# Patient Record
Sex: Male | Born: 1935 | Race: White | Hispanic: No | Marital: Married | Smoking: Never smoker
Health system: Southern US, Community
[De-identification: ages and names within clinical notes are randomized; demographics above are authoritative.]

## PROBLEM LIST (undated history)

## (undated) DIAGNOSIS — Z7901 Long term (current) use of anticoagulants: Secondary | ICD-10-CM

## (undated) DIAGNOSIS — L02519 Cutaneous abscess of unspecified hand: Secondary | ICD-10-CM

## (undated) DIAGNOSIS — G2581 Restless legs syndrome: Secondary | ICD-10-CM

## (undated) DIAGNOSIS — I2510 Atherosclerotic heart disease of native coronary artery without angina pectoris: Secondary | ICD-10-CM

## (undated) DIAGNOSIS — N400 Enlarged prostate without lower urinary tract symptoms: Secondary | ICD-10-CM

## (undated) DIAGNOSIS — R5381 Other malaise: Secondary | ICD-10-CM

## (undated) DIAGNOSIS — C801 Malignant (primary) neoplasm, unspecified: Secondary | ICD-10-CM

## (undated) DIAGNOSIS — G4700 Insomnia, unspecified: Secondary | ICD-10-CM

## (undated) DIAGNOSIS — Z85038 Personal history of other malignant neoplasm of large intestine: Secondary | ICD-10-CM

## (undated) DIAGNOSIS — G232 Striatonigral degeneration: Secondary | ICD-10-CM

## (undated) DIAGNOSIS — T783XXA Angioneurotic edema, initial encounter: Secondary | ICD-10-CM

## (undated) DIAGNOSIS — E785 Hyperlipidemia, unspecified: Secondary | ICD-10-CM

## (undated) DIAGNOSIS — K8020 Calculus of gallbladder without cholecystitis without obstruction: Secondary | ICD-10-CM

## (undated) DIAGNOSIS — R062 Wheezing: Secondary | ICD-10-CM

## (undated) DIAGNOSIS — L03119 Cellulitis of unspecified part of limb: Secondary | ICD-10-CM

## (undated) DIAGNOSIS — M5010 Cervical disc disorder with radiculopathy, unspecified cervical region: Secondary | ICD-10-CM

## (undated) DIAGNOSIS — I4891 Unspecified atrial fibrillation: Secondary | ICD-10-CM

## (undated) DIAGNOSIS — J189 Pneumonia, unspecified organism: Secondary | ICD-10-CM

## (undated) DIAGNOSIS — J309 Allergic rhinitis, unspecified: Secondary | ICD-10-CM

## (undated) DIAGNOSIS — H9190 Unspecified hearing loss, unspecified ear: Secondary | ICD-10-CM

## (undated) DIAGNOSIS — E079 Disorder of thyroid, unspecified: Secondary | ICD-10-CM

## (undated) DIAGNOSIS — G238 Other specified degenerative diseases of basal ganglia: Secondary | ICD-10-CM

## (undated) DIAGNOSIS — F411 Generalized anxiety disorder: Secondary | ICD-10-CM

## (undated) DIAGNOSIS — R6520 Severe sepsis without septic shock: Secondary | ICD-10-CM

## (undated) DIAGNOSIS — G4710 Hypersomnia, unspecified: Secondary | ICD-10-CM

## (undated) DIAGNOSIS — G4730 Sleep apnea, unspecified: Secondary | ICD-10-CM

## (undated) DIAGNOSIS — R0602 Shortness of breath: Secondary | ICD-10-CM

## (undated) DIAGNOSIS — Z8679 Personal history of other diseases of the circulatory system: Secondary | ICD-10-CM

## (undated) DIAGNOSIS — G4733 Obstructive sleep apnea (adult) (pediatric): Secondary | ICD-10-CM

## (undated) DIAGNOSIS — M48061 Spinal stenosis, lumbar region without neurogenic claudication: Secondary | ICD-10-CM

## (undated) DIAGNOSIS — Z86010 Personal history of colonic polyps: Secondary | ICD-10-CM

## (undated) DIAGNOSIS — M4800 Spinal stenosis, site unspecified: Secondary | ICD-10-CM

## (undated) DIAGNOSIS — A419 Sepsis, unspecified organism: Secondary | ICD-10-CM

## (undated) DIAGNOSIS — Z8546 Personal history of malignant neoplasm of prostate: Secondary | ICD-10-CM

## (undated) DIAGNOSIS — J019 Acute sinusitis, unspecified: Secondary | ICD-10-CM

## (undated) DIAGNOSIS — I252 Old myocardial infarction: Secondary | ICD-10-CM

## (undated) DIAGNOSIS — R5383 Other fatigue: Secondary | ICD-10-CM

## (undated) DIAGNOSIS — I10 Essential (primary) hypertension: Secondary | ICD-10-CM

## (undated) DIAGNOSIS — B351 Tinea unguium: Secondary | ICD-10-CM

## (undated) DIAGNOSIS — G20 Parkinson's disease: Secondary | ICD-10-CM

## (undated) DIAGNOSIS — M1990 Unspecified osteoarthritis, unspecified site: Secondary | ICD-10-CM

## (undated) DIAGNOSIS — R55 Syncope and collapse: Secondary | ICD-10-CM

## (undated) HISTORY — DX: Other malaise: R53.81

## (undated) HISTORY — DX: Essential (primary) hypertension: I10

## (undated) HISTORY — PX: CARDIAC ELECTROPHYSIOLOGY STUDY AND ABLATION: SHX1294

## (undated) HISTORY — DX: Calculus of gallbladder without cholecystitis without obstruction: K80.20

## (undated) HISTORY — DX: Spinal stenosis, lumbar region without neurogenic claudication: M48.061

## (undated) HISTORY — DX: Hypersomnia, unspecified: G47.10

## (undated) HISTORY — DX: Cellulitis of unspecified part of limb: L03.119

## (undated) HISTORY — DX: Acute sinusitis, unspecified: J01.90

## (undated) HISTORY — DX: Restless legs syndrome: G25.81

## (undated) HISTORY — DX: Parkinson's disease: G20

## (undated) HISTORY — DX: Allergic rhinitis, unspecified: J30.9

## (undated) HISTORY — DX: Wheezing: R06.2

## (undated) HISTORY — DX: Unspecified hearing loss, unspecified ear: H91.90

## (undated) HISTORY — DX: Benign prostatic hyperplasia without lower urinary tract symptoms: N40.0

## (undated) HISTORY — DX: Cutaneous abscess of unspecified hand: L02.519

## (undated) HISTORY — DX: Severe sepsis without septic shock: R65.20

## (undated) HISTORY — DX: Cervical disc disorder with radiculopathy, unspecified cervical region: M50.10

## (undated) HISTORY — DX: Atherosclerotic heart disease of native coronary artery without angina pectoris: I25.10

## (undated) HISTORY — DX: Sleep apnea, unspecified: G47.30

## (undated) HISTORY — DX: Personal history of malignant neoplasm of prostate: Z85.46

## (undated) HISTORY — PX: THYROIDECTOMY, PARTIAL: SHX18

## (undated) HISTORY — DX: Tinea unguium: B35.1

## (undated) HISTORY — DX: Sepsis, unspecified organism: A41.9

## (undated) HISTORY — DX: Hyperlipidemia, unspecified: E78.5

## (undated) HISTORY — DX: Other fatigue: R53.83

## (undated) HISTORY — DX: Shortness of breath: R06.02

## (undated) HISTORY — DX: Other specified degenerative diseases of basal ganglia: G23.8

## (undated) HISTORY — DX: Unspecified atrial fibrillation: I48.91

## (undated) HISTORY — DX: Syncope and collapse: R55

## (undated) HISTORY — DX: Personal history of colonic polyps: Z86.010

## (undated) HISTORY — DX: Insomnia, unspecified: G47.00

## (undated) HISTORY — DX: Long term (current) use of anticoagulants: Z79.01

## (undated) HISTORY — DX: Personal history of other malignant neoplasm of large intestine: Z85.038

## (undated) HISTORY — DX: Striatonigral degeneration: G23.2

## (undated) HISTORY — DX: Obstructive sleep apnea (adult) (pediatric): G47.33

## (undated) HISTORY — DX: Generalized anxiety disorder: F41.1

## (undated) HISTORY — DX: Angioneurotic edema, initial encounter: T78.3XXA

## (undated) HISTORY — DX: Old myocardial infarction: I25.2

---

## 1993-11-08 HISTORY — PX: CORONARY ARTERY BYPASS GRAFT: SHX141

## 1996-11-08 HISTORY — PX: HERNIA REPAIR: SHX51

## 1999-11-09 HISTORY — PX: PROSTATECTOMY: SHX69

## 2005-11-08 HISTORY — PX: CARDIOVERSION: SHX1299

## 2006-10-07 HISTORY — PX: OTHER SURGICAL HISTORY: SHX169

## 2006-12-20 ENCOUNTER — Ambulatory Visit: Payer: Self-pay | Admitting: Internal Medicine

## 2006-12-20 LAB — CONVERTED CEMR LAB
AST: 28 units/L (ref 0–37)
Basophils Relative: 0.4 % (ref 0.0–1.0)
CO2: 33 meq/L — ABNORMAL HIGH (ref 19–32)
Chloride: 100 meq/L (ref 96–112)
Creatinine, Ser: 0.7 mg/dL (ref 0.4–1.5)
Direct LDL: 61 mg/dL
Eosinophils Relative: 3.9 % (ref 0.0–5.0)
HCT: 45.4 % (ref 39.0–52.0)
HDL: 33.4 mg/dL — ABNORMAL LOW (ref >39.0)
Hemoglobin: 16 g/dL (ref 13.0–17.0)
INR: 1.8 (ref 0.9–2.0)
Monocytes Absolute: 0.8 10*3/uL — ABNORMAL HIGH (ref 0.2–0.7)
Neutrophils Relative %: 58.2 % (ref 43.0–77.0)
Potassium: 4.5 meq/L (ref 3.5–5.1)
Prothrombin Time: 17 s — ABNORMAL HIGH (ref 10.0–14.0)
RBC: 5.16 M/uL (ref 4.22–5.81)
RDW: 13.4 % (ref 11.5–14.6)
Sodium: 139 meq/L (ref 135–145)
TSH: 0.89 microintl units/mL (ref 0.35–5.50)
Total Bilirubin: 1.7 mg/dL — ABNORMAL HIGH (ref 0.3–1.2)
Total Protein: 6.5 g/dL (ref 6.0–8.3)
WBC: 6.5 10*3/uL (ref 4.5–10.5)

## 2007-01-04 ENCOUNTER — Ambulatory Visit: Payer: Self-pay | Admitting: Endocrinology

## 2007-01-10 ENCOUNTER — Ambulatory Visit: Payer: Self-pay | Admitting: Cardiology

## 2007-01-11 ENCOUNTER — Encounter: Admission: RE | Admit: 2007-01-11 | Discharge: 2007-01-11 | Payer: Self-pay | Admitting: Endocrinology

## 2007-01-18 ENCOUNTER — Ambulatory Visit: Payer: Self-pay | Admitting: Cardiology

## 2007-02-03 ENCOUNTER — Ambulatory Visit: Payer: Self-pay | Admitting: Cardiology

## 2007-02-06 ENCOUNTER — Ambulatory Visit: Payer: Self-pay | Admitting: Internal Medicine

## 2007-02-08 ENCOUNTER — Ambulatory Visit: Payer: Self-pay

## 2007-02-08 HISTORY — PX: OTHER SURGICAL HISTORY: SHX169

## 2007-02-13 ENCOUNTER — Ambulatory Visit: Payer: Self-pay | Admitting: Cardiology

## 2007-02-14 ENCOUNTER — Ambulatory Visit: Payer: Self-pay

## 2007-02-14 LAB — CONVERTED CEMR LAB
Basophils Absolute: 0 10*3/uL (ref 0.0–0.1)
Chloride: 109 meq/L (ref 96–112)
Eosinophils Absolute: 0.3 10*3/uL (ref 0.0–0.6)
Eosinophils Relative: 3.9 % (ref 0.0–5.0)
GFR calc Af Amer: 143 mL/min
GFR calc non Af Amer: 118 mL/min
Glucose, Bld: 88 mg/dL (ref 70–99)
HCT: 41.6 % (ref 39.0–52.0)
Lymphocytes Relative: 17.6 % (ref 12.0–46.0)
MCHC: 35.2 g/dL (ref 30.0–36.0)
MCV: 87.9 fL (ref 78.0–100.0)
Neutro Abs: 4.7 10*3/uL (ref 1.4–7.7)
Neutrophils Relative %: 67.3 % (ref 43.0–77.0)
Platelets: 211 10*3/uL (ref 150–400)
RBC: 4.73 M/uL (ref 4.22–5.81)
Sodium: 147 meq/L — ABNORMAL HIGH (ref 135–145)
aPTT: 30 s (ref 26.5–36.5)

## 2007-02-17 ENCOUNTER — Ambulatory Visit: Payer: Self-pay | Admitting: Cardiology

## 2007-02-17 ENCOUNTER — Inpatient Hospital Stay (HOSPITAL_BASED_OUTPATIENT_CLINIC_OR_DEPARTMENT_OTHER): Admission: RE | Admit: 2007-02-17 | Discharge: 2007-02-17 | Payer: Self-pay | Admitting: Cardiology

## 2007-02-24 ENCOUNTER — Ambulatory Visit: Payer: Self-pay | Admitting: Cardiology

## 2007-03-07 ENCOUNTER — Ambulatory Visit: Payer: Self-pay | Admitting: Cardiology

## 2007-03-20 ENCOUNTER — Ambulatory Visit: Payer: Self-pay | Admitting: Internal Medicine

## 2007-03-23 ENCOUNTER — Ambulatory Visit: Payer: Self-pay | Admitting: Internal Medicine

## 2007-03-30 ENCOUNTER — Encounter: Payer: Self-pay | Admitting: Internal Medicine

## 2007-03-30 ENCOUNTER — Ambulatory Visit: Payer: Self-pay | Admitting: Internal Medicine

## 2007-04-06 ENCOUNTER — Ambulatory Visit: Payer: Self-pay | Admitting: *Deleted

## 2007-04-20 ENCOUNTER — Ambulatory Visit: Payer: Self-pay | Admitting: Cardiology

## 2007-04-25 ENCOUNTER — Ambulatory Visit (HOSPITAL_COMMUNITY): Admission: RE | Admit: 2007-04-25 | Discharge: 2007-04-26 | Payer: Self-pay | Admitting: Surgery

## 2007-04-25 ENCOUNTER — Encounter (INDEPENDENT_AMBULATORY_CARE_PROVIDER_SITE_OTHER): Payer: Self-pay | Admitting: Surgery

## 2007-05-01 ENCOUNTER — Ambulatory Visit: Payer: Self-pay | Admitting: Cardiovascular Disease

## 2007-05-02 ENCOUNTER — Encounter: Payer: Self-pay | Admitting: Internal Medicine

## 2007-05-08 ENCOUNTER — Ambulatory Visit: Payer: Self-pay | Admitting: Cardiology

## 2007-05-17 ENCOUNTER — Ambulatory Visit: Payer: Self-pay | Admitting: Cardiology

## 2007-05-31 ENCOUNTER — Ambulatory Visit: Payer: Self-pay | Admitting: Cardiology

## 2007-06-19 ENCOUNTER — Ambulatory Visit: Payer: Self-pay | Admitting: Internal Medicine

## 2007-06-26 ENCOUNTER — Ambulatory Visit: Payer: Self-pay | Admitting: Cardiology

## 2007-07-27 ENCOUNTER — Ambulatory Visit: Payer: Self-pay | Admitting: Cardiology

## 2007-07-27 ENCOUNTER — Ambulatory Visit: Payer: Self-pay | Admitting: Internal Medicine

## 2007-07-28 ENCOUNTER — Encounter: Payer: Self-pay | Admitting: *Deleted

## 2007-07-28 DIAGNOSIS — I2510 Atherosclerotic heart disease of native coronary artery without angina pectoris: Secondary | ICD-10-CM | POA: Insufficient documentation

## 2007-07-28 DIAGNOSIS — E785 Hyperlipidemia, unspecified: Secondary | ICD-10-CM | POA: Insufficient documentation

## 2007-07-28 DIAGNOSIS — I252 Old myocardial infarction: Secondary | ICD-10-CM

## 2007-07-28 DIAGNOSIS — F329 Major depressive disorder, single episode, unspecified: Secondary | ICD-10-CM

## 2007-07-28 DIAGNOSIS — I10 Essential (primary) hypertension: Secondary | ICD-10-CM

## 2007-07-28 DIAGNOSIS — Z860100 Personal history of colon polyps, unspecified: Secondary | ICD-10-CM

## 2007-07-28 DIAGNOSIS — Z8546 Personal history of malignant neoplasm of prostate: Secondary | ICD-10-CM

## 2007-07-28 DIAGNOSIS — Z85038 Personal history of other malignant neoplasm of large intestine: Secondary | ICD-10-CM

## 2007-07-28 DIAGNOSIS — R55 Syncope and collapse: Secondary | ICD-10-CM | POA: Insufficient documentation

## 2007-07-28 DIAGNOSIS — I499 Cardiac arrhythmia, unspecified: Secondary | ICD-10-CM | POA: Insufficient documentation

## 2007-07-28 HISTORY — DX: Personal history of colon polyps, unspecified: Z86.0100

## 2007-07-28 HISTORY — DX: Personal history of colonic polyps: Z86.010

## 2007-07-28 HISTORY — DX: Hyperlipidemia, unspecified: E78.5

## 2007-07-28 HISTORY — DX: Personal history of other malignant neoplasm of large intestine: Z85.038

## 2007-07-28 HISTORY — DX: Atherosclerotic heart disease of native coronary artery without angina pectoris: I25.10

## 2007-07-28 HISTORY — DX: Old myocardial infarction: I25.2

## 2007-07-28 HISTORY — DX: Personal history of malignant neoplasm of prostate: Z85.46

## 2007-07-28 HISTORY — DX: Syncope and collapse: R55

## 2007-07-28 HISTORY — DX: Essential (primary) hypertension: I10

## 2007-08-07 ENCOUNTER — Ambulatory Visit: Payer: Self-pay | Admitting: Internal Medicine

## 2007-08-24 ENCOUNTER — Ambulatory Visit: Payer: Self-pay | Admitting: Cardiology

## 2007-09-19 ENCOUNTER — Ambulatory Visit: Payer: Self-pay | Admitting: Cardiovascular Disease

## 2007-09-25 ENCOUNTER — Emergency Department (HOSPITAL_COMMUNITY): Admission: EM | Admit: 2007-09-25 | Discharge: 2007-09-25 | Payer: Self-pay | Admitting: Emergency Medicine

## 2007-09-28 DIAGNOSIS — I4891 Unspecified atrial fibrillation: Secondary | ICD-10-CM | POA: Insufficient documentation

## 2007-09-28 DIAGNOSIS — K8020 Calculus of gallbladder without cholecystitis without obstruction: Secondary | ICD-10-CM | POA: Insufficient documentation

## 2007-09-28 DIAGNOSIS — F411 Generalized anxiety disorder: Secondary | ICD-10-CM | POA: Insufficient documentation

## 2007-09-28 DIAGNOSIS — J309 Allergic rhinitis, unspecified: Secondary | ICD-10-CM

## 2007-09-28 DIAGNOSIS — N400 Enlarged prostate without lower urinary tract symptoms: Secondary | ICD-10-CM

## 2007-09-28 DIAGNOSIS — J45909 Unspecified asthma, uncomplicated: Secondary | ICD-10-CM | POA: Insufficient documentation

## 2007-09-28 DIAGNOSIS — F068 Other specified mental disorders due to known physiological condition: Secondary | ICD-10-CM | POA: Insufficient documentation

## 2007-09-28 HISTORY — DX: Generalized anxiety disorder: F41.1

## 2007-09-28 HISTORY — DX: Unspecified atrial fibrillation: I48.91

## 2007-09-28 HISTORY — DX: Benign prostatic hyperplasia without lower urinary tract symptoms: N40.0

## 2007-09-28 HISTORY — DX: Calculus of gallbladder without cholecystitis without obstruction: K80.20

## 2007-09-28 HISTORY — DX: Allergic rhinitis, unspecified: J30.9

## 2007-10-10 ENCOUNTER — Ambulatory Visit: Payer: Self-pay | Admitting: Internal Medicine

## 2007-10-10 DIAGNOSIS — J019 Acute sinusitis, unspecified: Secondary | ICD-10-CM

## 2007-10-10 HISTORY — DX: Acute sinusitis, unspecified: J01.90

## 2007-10-17 ENCOUNTER — Ambulatory Visit: Payer: Self-pay | Admitting: Cardiology

## 2007-10-27 ENCOUNTER — Ambulatory Visit: Payer: Self-pay | Admitting: Cardiology

## 2007-11-14 ENCOUNTER — Ambulatory Visit: Payer: Self-pay | Admitting: Cardiology

## 2007-11-23 ENCOUNTER — Encounter: Payer: Self-pay | Admitting: Internal Medicine

## 2007-12-20 ENCOUNTER — Ambulatory Visit: Payer: Self-pay | Admitting: Cardiology

## 2007-12-26 ENCOUNTER — Ambulatory Visit: Payer: Self-pay | Admitting: Internal Medicine

## 2007-12-26 DIAGNOSIS — G2581 Restless legs syndrome: Secondary | ICD-10-CM

## 2007-12-26 DIAGNOSIS — G4710 Hypersomnia, unspecified: Secondary | ICD-10-CM

## 2007-12-26 DIAGNOSIS — G4730 Sleep apnea, unspecified: Secondary | ICD-10-CM

## 2007-12-26 HISTORY — DX: Hypersomnia, unspecified: G47.10

## 2007-12-26 HISTORY — DX: Restless legs syndrome: G25.81

## 2007-12-27 LAB — CONVERTED CEMR LAB
TSH: 2.28 microintl units/mL (ref 0.35–5.50)
Transferrin: 205.1 mg/dL — ABNORMAL LOW (ref >=212.0)

## 2008-01-01 ENCOUNTER — Ambulatory Visit: Payer: Self-pay | Admitting: Internal Medicine

## 2008-01-01 ENCOUNTER — Ambulatory Visit: Payer: Self-pay | Admitting: Cardiology

## 2008-01-01 DIAGNOSIS — R5381 Other malaise: Secondary | ICD-10-CM

## 2008-01-01 DIAGNOSIS — G4733 Obstructive sleep apnea (adult) (pediatric): Secondary | ICD-10-CM

## 2008-01-01 DIAGNOSIS — R5383 Other fatigue: Secondary | ICD-10-CM

## 2008-01-01 HISTORY — DX: Other malaise: R53.81

## 2008-01-01 HISTORY — DX: Obstructive sleep apnea (adult) (pediatric): G47.33

## 2008-01-01 LAB — CONVERTED CEMR LAB
AST: 34 units/L (ref 0–37)
Albumin: 3.9 g/dL (ref 3.5–5.2)
Alkaline Phosphatase: 57 units/L (ref 39–117)
BUN: 11 mg/dL (ref 6–23)
Basophils Absolute: 0 10*3/uL (ref 0.0–0.1)
Chloride: 103 meq/L (ref 96–112)
Cholesterol: 118 mg/dL (ref 0–200)
Creatinine, Ser: 0.9 mg/dL (ref 0.4–1.5)
Eosinophils Absolute: 0.3 10*3/uL (ref 0.0–0.6)
GFR calc non Af Amer: 89 mL/min
HCT: 43.8 % (ref 39.0–52.0)
HDL: 32.1 mg/dL — ABNORMAL LOW (ref >39.0)
LDL Cholesterol: 58 mg/dL (ref 0–99)
MCHC: 34 g/dL (ref 30.0–36.0)
MCV: 88.9 fL (ref 78.0–100.0)
Monocytes Relative: 10.2 % (ref 3.0–11.0)
Neutrophils Relative %: 64.3 % (ref 43.0–77.0)
PSA: 0.02 ng/mL — ABNORMAL LOW (ref 0.10–4.00)
Platelets: 168 10*3/uL (ref 150–400)
RBC: 4.92 M/uL (ref 4.22–5.81)
RDW: 13.8 % (ref 11.5–14.6)
Sodium: 140 meq/L (ref 135–145)
TSH: 2.36 microintl units/mL (ref 0.35–5.50)
Total CHOL/HDL Ratio: 3.7
Triglycerides: 138 mg/dL (ref 0–149)

## 2008-02-14 ENCOUNTER — Ambulatory Visit: Payer: Self-pay | Admitting: Internal Medicine

## 2008-02-14 ENCOUNTER — Encounter: Payer: Self-pay | Admitting: Internal Medicine

## 2008-02-14 ENCOUNTER — Ambulatory Visit: Payer: Self-pay | Admitting: Cardiovascular Disease

## 2008-02-21 ENCOUNTER — Ambulatory Visit: Payer: Self-pay | Admitting: Cardiology

## 2008-03-14 ENCOUNTER — Ambulatory Visit: Payer: Self-pay | Admitting: Internal Medicine

## 2008-05-14 ENCOUNTER — Telehealth (INDEPENDENT_AMBULATORY_CARE_PROVIDER_SITE_OTHER): Payer: Self-pay | Admitting: *Deleted

## 2008-06-04 ENCOUNTER — Ambulatory Visit: Payer: Self-pay | Admitting: Internal Medicine

## 2008-06-18 ENCOUNTER — Ambulatory Visit: Payer: Self-pay | Admitting: Internal Medicine

## 2008-06-18 ENCOUNTER — Ambulatory Visit: Payer: Self-pay | Admitting: Cardiovascular Disease

## 2008-06-27 ENCOUNTER — Encounter (INDEPENDENT_AMBULATORY_CARE_PROVIDER_SITE_OTHER): Payer: Self-pay | Admitting: *Deleted

## 2008-06-27 ENCOUNTER — Ambulatory Visit: Payer: Self-pay | Admitting: Internal Medicine

## 2008-06-27 DIAGNOSIS — B351 Tinea unguium: Secondary | ICD-10-CM

## 2008-06-27 DIAGNOSIS — H9190 Unspecified hearing loss, unspecified ear: Secondary | ICD-10-CM

## 2008-06-27 HISTORY — DX: Unspecified hearing loss, unspecified ear: H91.90

## 2008-06-27 HISTORY — DX: Tinea unguium: B35.1

## 2008-07-02 ENCOUNTER — Ambulatory Visit: Payer: Self-pay | Admitting: Internal Medicine

## 2008-08-06 ENCOUNTER — Encounter: Payer: Self-pay | Admitting: Internal Medicine

## 2008-08-06 ENCOUNTER — Ambulatory Visit: Payer: Self-pay | Admitting: Cardiology

## 2008-08-13 ENCOUNTER — Ambulatory Visit: Payer: Self-pay | Admitting: Cardiology

## 2008-08-20 ENCOUNTER — Ambulatory Visit: Payer: Self-pay | Admitting: Internal Medicine

## 2008-08-27 ENCOUNTER — Telehealth: Payer: Self-pay | Admitting: Internal Medicine

## 2008-08-29 ENCOUNTER — Ambulatory Visit: Payer: Self-pay | Admitting: Internal Medicine

## 2008-09-26 ENCOUNTER — Ambulatory Visit: Payer: Self-pay | Admitting: Cardiology

## 2008-10-14 ENCOUNTER — Ambulatory Visit: Payer: Self-pay | Admitting: Internal Medicine

## 2008-11-08 HISTORY — PX: COLONOSCOPY: SHX174

## 2008-12-23 ENCOUNTER — Telehealth (INDEPENDENT_AMBULATORY_CARE_PROVIDER_SITE_OTHER): Payer: Self-pay | Admitting: *Deleted

## 2009-01-16 ENCOUNTER — Ambulatory Visit: Payer: Self-pay

## 2009-02-13 ENCOUNTER — Ambulatory Visit: Payer: Self-pay | Admitting: Internal Medicine

## 2009-03-13 ENCOUNTER — Ambulatory Visit: Payer: Self-pay | Admitting: Internal Medicine

## 2009-03-31 ENCOUNTER — Ambulatory Visit: Payer: Self-pay | Admitting: Cardiovascular Disease

## 2009-04-03 ENCOUNTER — Ambulatory Visit: Payer: Self-pay | Admitting: Internal Medicine

## 2009-04-03 DIAGNOSIS — G4700 Insomnia, unspecified: Secondary | ICD-10-CM

## 2009-04-03 DIAGNOSIS — R0602 Shortness of breath: Secondary | ICD-10-CM

## 2009-04-03 HISTORY — DX: Shortness of breath: R06.02

## 2009-04-03 HISTORY — DX: Insomnia, unspecified: G47.00

## 2009-04-03 LAB — CONVERTED CEMR LAB
ALT: 25 units/L (ref 0–53)
Albumin: 3.7 g/dL (ref 3.5–5.2)
BUN: 14 mg/dL (ref 6–23)
Basophils Relative: 0.6 % (ref 0.0–3.0)
Bilirubin Urine: NEGATIVE
CO2: 33 meq/L — ABNORMAL HIGH (ref 19–32)
Chloride: 106 meq/L (ref 96–112)
Cholesterol: 137 mg/dL (ref 0–200)
Eosinophils Relative: 3.3 % (ref 0.0–5.0)
HCT: 40.7 % (ref 39.0–52.0)
Hemoglobin, Urine: NEGATIVE
Lymphs Abs: 1 10*3/uL (ref 0.7–4.0)
MCHC: 35.4 g/dL (ref 30.0–36.0)
MCV: 88.6 fL (ref 78.0–100.0)
Monocytes Absolute: 0.6 10*3/uL (ref 0.1–1.0)
Potassium: 4.2 meq/L (ref 3.5–5.1)
RBC: 4.59 M/uL (ref 4.22–5.81)
Sed Rate: 8 mm/hr (ref 0–22)
Total CHOL/HDL Ratio: 3
Total Protein, Urine: NEGATIVE mg/dL
Total Protein: 6.4 g/dL (ref 6.0–8.3)
Urine Glucose: NEGATIVE mg/dL
VLDL: 44 mg/dL — ABNORMAL HIGH (ref 0.0–40.0)
Vitamin B-12: 1099 pg/mL — ABNORMAL HIGH (ref 211–911)
WBC: 4.8 10*3/uL (ref 4.5–10.5)

## 2009-04-08 ENCOUNTER — Encounter: Payer: Self-pay | Admitting: *Deleted

## 2009-04-08 LAB — CONVERTED CEMR LAB: Vit D, 25-Hydroxy: 28 ng/mL — ABNORMAL LOW (ref 30–89)

## 2009-05-05 ENCOUNTER — Ambulatory Visit: Payer: Self-pay | Admitting: Cardiology

## 2009-05-14 ENCOUNTER — Encounter: Payer: Self-pay | Admitting: *Deleted

## 2009-05-20 ENCOUNTER — Ambulatory Visit: Payer: Self-pay | Admitting: Cardiology

## 2009-05-20 LAB — CONVERTED CEMR LAB: POC INR: 1

## 2009-05-27 ENCOUNTER — Ambulatory Visit: Payer: Self-pay | Admitting: Cardiology

## 2009-06-02 ENCOUNTER — Ambulatory Visit: Payer: Self-pay | Admitting: Cardiology

## 2009-06-02 LAB — CONVERTED CEMR LAB
POC INR: 3.5
Prothrombin Time: 22.5 s

## 2009-06-05 ENCOUNTER — Encounter: Payer: Self-pay | Admitting: Internal Medicine

## 2009-06-05 DIAGNOSIS — N529 Male erectile dysfunction, unspecified: Secondary | ICD-10-CM

## 2009-06-06 ENCOUNTER — Ambulatory Visit: Payer: Self-pay | Admitting: Internal Medicine

## 2009-06-25 ENCOUNTER — Encounter (INDEPENDENT_AMBULATORY_CARE_PROVIDER_SITE_OTHER): Payer: Self-pay | Admitting: *Deleted

## 2009-06-26 ENCOUNTER — Ambulatory Visit: Payer: Self-pay | Admitting: Cardiology

## 2009-07-25 ENCOUNTER — Ambulatory Visit: Payer: Self-pay | Admitting: Internal Medicine

## 2009-08-19 ENCOUNTER — Ambulatory Visit: Payer: Self-pay | Admitting: Cardiology

## 2009-09-16 ENCOUNTER — Ambulatory Visit: Payer: Self-pay | Admitting: Cardiology

## 2009-09-16 LAB — CONVERTED CEMR LAB: POC INR: 1.8

## 2009-09-23 ENCOUNTER — Ambulatory Visit: Payer: Self-pay | Admitting: Internal Medicine

## 2009-09-24 LAB — CONVERTED CEMR LAB
Alkaline Phosphatase: 72 units/L (ref 39–117)
Basophils Absolute: 0 10*3/uL (ref 0.0–0.1)
Bilirubin Urine: NEGATIVE
Bilirubin, Direct: 0.2 mg/dL (ref 0.0–0.3)
CO2: 31 meq/L (ref 19–32)
Calcium: 8.8 mg/dL (ref 8.4–10.5)
Creatinine, Ser: 0.8 mg/dL (ref 0.4–1.5)
Eosinophils Absolute: 0.2 10*3/uL (ref 0.0–0.7)
Folate: >20 ng/mL
Glucose, Bld: 86 mg/dL (ref 70–99)
Iron: 72 ug/dL (ref 42–165)
Ketones, ur: NEGATIVE mg/dL
Leukocytes, UA: NEGATIVE
Lymphocytes Relative: 23.2 % (ref 12.0–46.0)
MCHC: 34.2 g/dL (ref 30.0–36.0)
Neutrophils Relative %: 62 % (ref 43.0–77.0)
RBC: 4.55 M/uL (ref 4.22–5.81)
RDW: 13.6 % (ref 11.5–14.6)
Total Bilirubin: 1.8 mg/dL — ABNORMAL HIGH (ref 0.3–1.2)
Transferrin: 212 mg/dL (ref 212.0–360.0)
Urobilinogen, UA: 0.2 (ref 0.0–1.0)
Vitamin B-12: 1477 pg/mL — ABNORMAL HIGH (ref 211–911)

## 2009-09-29 ENCOUNTER — Ambulatory Visit: Payer: Self-pay | Admitting: Cardiology

## 2009-10-20 ENCOUNTER — Encounter (INDEPENDENT_AMBULATORY_CARE_PROVIDER_SITE_OTHER): Payer: Self-pay | Admitting: *Deleted

## 2009-10-28 ENCOUNTER — Telehealth: Payer: Self-pay | Admitting: Internal Medicine

## 2009-11-06 ENCOUNTER — Ambulatory Visit: Payer: Self-pay | Admitting: Internal Medicine

## 2009-11-06 DIAGNOSIS — T783XXA Angioneurotic edema, initial encounter: Secondary | ICD-10-CM

## 2009-11-06 DIAGNOSIS — R062 Wheezing: Secondary | ICD-10-CM

## 2009-11-06 HISTORY — DX: Angioneurotic edema, initial encounter: T78.3XXA

## 2009-11-06 HISTORY — DX: Wheezing: R06.2

## 2009-11-24 ENCOUNTER — Ambulatory Visit: Payer: Self-pay | Admitting: Cardiology

## 2009-11-28 ENCOUNTER — Telehealth: Payer: Self-pay | Admitting: Internal Medicine

## 2009-12-05 ENCOUNTER — Encounter: Payer: Self-pay | Admitting: Internal Medicine

## 2009-12-22 ENCOUNTER — Encounter: Payer: Self-pay | Admitting: Internal Medicine

## 2010-01-01 ENCOUNTER — Ambulatory Visit: Payer: Self-pay | Admitting: Cardiology

## 2010-01-29 ENCOUNTER — Ambulatory Visit: Payer: Self-pay | Admitting: Cardiology

## 2010-01-29 LAB — CONVERTED CEMR LAB: POC INR: 2

## 2010-02-02 ENCOUNTER — Telehealth: Payer: Self-pay | Admitting: Internal Medicine

## 2010-02-26 ENCOUNTER — Ambulatory Visit: Payer: Self-pay | Admitting: Cardiology

## 2010-03-04 ENCOUNTER — Ambulatory Visit: Payer: Self-pay | Admitting: Internal Medicine

## 2010-03-05 LAB — CONVERTED CEMR LAB
Albumin: 4 g/dL (ref 3.5–5.2)
BUN: 9 mg/dL (ref 6–23)
Basophils Absolute: 0 10*3/uL (ref 0.0–0.1)
Bilirubin Urine: NEGATIVE
CO2: 33 meq/L — ABNORMAL HIGH (ref 19–32)
Cholesterol: 133 mg/dL (ref 0–200)
Eosinophils Absolute: 0.3 10*3/uL (ref 0.0–0.7)
GFR calc non Af Amer: 100.68 mL/min (ref >60)
Glucose, Bld: 119 mg/dL — ABNORMAL HIGH (ref 70–99)
HCT: 41.5 % (ref 39.0–52.0)
Hemoglobin, Urine: NEGATIVE
Iron: 53 ug/dL (ref 42–165)
Lymphs Abs: 1.2 10*3/uL (ref 0.7–4.0)
MCHC: 35.2 g/dL (ref 30.0–36.0)
MCV: 90 fL (ref 78.0–100.0)
Monocytes Absolute: 0.6 10*3/uL (ref 0.1–1.0)
Monocytes Relative: 10.5 % (ref 3.0–12.0)
Neutro Abs: 3.6 10*3/uL (ref 1.4–7.7)
Nitrite: NEGATIVE
Platelets: 182 10*3/uL (ref 150.0–400.0)
Potassium: 4.1 meq/L (ref 3.5–5.1)
Pro B Natriuretic peptide (BNP): 197 pg/mL — ABNORMAL HIGH (ref 0.0–100.0)
RDW: 14.5 % (ref 11.5–14.6)
Saturation Ratios: 17.9 % — ABNORMAL LOW (ref 20.0–50.0)
Sed Rate: 8 mm/hr (ref 0–22)
TSH: 1.16 microintl units/mL (ref 0.35–5.50)
Total Bilirubin: 1.6 mg/dL — ABNORMAL HIGH (ref 0.3–1.2)
Transferrin: 211.4 mg/dL — ABNORMAL LOW (ref 212.0–360.0)
Urine Glucose: NEGATIVE mg/dL
VLDL: 53.4 mg/dL — ABNORMAL HIGH (ref 0.0–40.0)
Vit D, 25-Hydroxy: 34 ng/mL (ref 30–89)
pH: 7 (ref 5.0–8.0)

## 2010-03-20 ENCOUNTER — Ambulatory Visit: Payer: Self-pay | Admitting: Cardiology

## 2010-03-20 LAB — CONVERTED CEMR LAB: POC INR: 2.9

## 2010-04-16 ENCOUNTER — Ambulatory Visit: Payer: Self-pay | Admitting: Internal Medicine

## 2010-04-16 LAB — CONVERTED CEMR LAB: POC INR: 3

## 2010-05-14 ENCOUNTER — Ambulatory Visit: Payer: Self-pay | Admitting: Internal Medicine

## 2010-07-09 ENCOUNTER — Ambulatory Visit: Payer: Self-pay | Admitting: Internal Medicine

## 2010-07-09 LAB — CONVERTED CEMR LAB: POC INR: 2.4

## 2010-07-14 ENCOUNTER — Ambulatory Visit: Payer: Self-pay | Admitting: Internal Medicine

## 2010-07-14 ENCOUNTER — Encounter: Payer: Self-pay | Admitting: Internal Medicine

## 2010-07-15 LAB — CONVERTED CEMR LAB
ALT: 20 units/L (ref 0–53)
AST: 31 units/L (ref 0–37)
Alkaline Phosphatase: 74 units/L (ref 39–117)
BUN: 12 mg/dL (ref 6–23)
Basophils Relative: 0.4 % (ref 0.0–3.0)
Bilirubin Urine: NEGATIVE
Bilirubin, Direct: 0.3 mg/dL (ref 0.0–0.3)
Chloride: 103 meq/L (ref 96–112)
Creatinine, Ser: 0.7 mg/dL (ref 0.4–1.5)
Eosinophils Relative: 4.1 % (ref 0.0–5.0)
GFR calc non Af Amer: 110.05 mL/min (ref >60)
HCT: 42.8 % (ref 39.0–52.0)
Ketones, ur: NEGATIVE mg/dL
Leukocytes, UA: NEGATIVE
MCV: 92.2 fL (ref 78.0–100.0)
Monocytes Relative: 11.4 % (ref 3.0–12.0)
Neutrophils Relative %: 60.2 % (ref 43.0–77.0)
Platelets: 201 10*3/uL (ref 150.0–400.0)
Potassium: 4.9 meq/L (ref 3.5–5.1)
RBC: 4.64 M/uL (ref 4.22–5.81)
Specific Gravity, Urine: 1.02 (ref 1.000–1.030)
Total Bilirubin: 1.8 mg/dL — ABNORMAL HIGH (ref 0.3–1.2)
Total Protein, Urine: NEGATIVE mg/dL
Total Protein: 6.7 g/dL (ref 6.0–8.3)
Urine Glucose: NEGATIVE mg/dL
WBC: 5.4 10*3/uL (ref 4.5–10.5)
pH: 8 (ref 5.0–8.0)

## 2010-07-21 ENCOUNTER — Ambulatory Visit: Payer: Self-pay | Admitting: Cardiology

## 2010-07-23 ENCOUNTER — Ambulatory Visit (HOSPITAL_COMMUNITY): Admission: RE | Admit: 2010-07-23 | Discharge: 2010-07-23 | Payer: Self-pay | Admitting: Internal Medicine

## 2010-07-23 ENCOUNTER — Ambulatory Visit: Payer: Self-pay | Admitting: Internal Medicine

## 2010-08-06 ENCOUNTER — Encounter: Payer: Self-pay | Admitting: Internal Medicine

## 2010-08-14 ENCOUNTER — Ambulatory Visit: Payer: Self-pay | Admitting: Cardiology

## 2010-08-14 LAB — CONVERTED CEMR LAB: POC INR: 1.9

## 2010-08-25 ENCOUNTER — Ambulatory Visit (HOSPITAL_COMMUNITY): Admission: RE | Admit: 2010-08-25 | Discharge: 2010-08-25 | Payer: Self-pay | Admitting: Cardiology

## 2010-08-25 ENCOUNTER — Ambulatory Visit: Payer: Self-pay

## 2010-08-25 ENCOUNTER — Encounter: Payer: Self-pay | Admitting: Cardiology

## 2010-08-25 ENCOUNTER — Ambulatory Visit: Payer: Self-pay | Admitting: Internal Medicine

## 2010-09-10 ENCOUNTER — Ambulatory Visit: Payer: Self-pay | Admitting: Cardiology

## 2010-09-10 LAB — CONVERTED CEMR LAB: POC INR: 2.5

## 2010-09-14 ENCOUNTER — Telehealth: Payer: Self-pay | Admitting: Internal Medicine

## 2010-09-24 ENCOUNTER — Ambulatory Visit: Payer: Self-pay | Admitting: Internal Medicine

## 2010-09-24 DIAGNOSIS — L02519 Cutaneous abscess of unspecified hand: Secondary | ICD-10-CM

## 2010-09-24 DIAGNOSIS — L03119 Cellulitis of unspecified part of limb: Secondary | ICD-10-CM

## 2010-09-24 HISTORY — DX: Cutaneous abscess of unspecified hand: L02.519

## 2010-10-15 ENCOUNTER — Ambulatory Visit: Payer: Self-pay | Admitting: Internal Medicine

## 2010-10-15 LAB — CONVERTED CEMR LAB: POC INR: 2.2

## 2010-10-27 ENCOUNTER — Emergency Department (HOSPITAL_COMMUNITY)
Admission: EM | Admit: 2010-10-27 | Discharge: 2010-10-27 | Payer: Self-pay | Source: Home / Self Care | Admitting: Emergency Medicine

## 2010-10-27 ENCOUNTER — Telehealth: Payer: Self-pay | Admitting: Internal Medicine

## 2010-11-20 ENCOUNTER — Telehealth: Payer: Self-pay | Admitting: Internal Medicine

## 2010-11-26 ENCOUNTER — Ambulatory Visit: Admission: RE | Admit: 2010-11-26 | Discharge: 2010-11-26 | Payer: Self-pay | Source: Home / Self Care

## 2010-11-26 LAB — CONVERTED CEMR LAB: POC INR: 2.4

## 2010-12-08 NOTE — Letter (Signed)
Summary: Cardioversion/TEE Instructions  Architectural technologist, Main Office  [address]   [place], [place] [address]   Phone: [phone]  Fax: [phone]    Cardioversion / TEE Cardioversion Instructions  07/21/2010 MRN: [id number]  [name] [name] [address] [place], [place]  [address]  Dear [name] [name], You are scheduled for a Cardioversion  on July 23, 2010 with Dr.  Arvilla Meres   Please arrive at the [facility] at 11 a.m. on the day of your procedure.  1)   DIET:  A)   Nothing to eat or drink after midnight except your medications with a sip of water.    2)   MAKE SURE YOU TAKE YOUR COUMADIN.  3)   A)   DO NOT TAKE these medications before your procedure:        Medications ok__________________________________________________     ___________________________________________________________________     ___________________________________________________________________  B)   YOU MAY TAKE ALL of your remaining medications with a small amount of water.   4)  Must have a responsible person to drive you home.  5)   Bring a current list of your medications and current insurance cards.   * Special Note:  Every effort is made to have your procedure done on time. Occasionally there are emergencies that present themselves at the hospital that may cause delays. Please be patient if a delay does occur.  * If you have any questions after you get home, please call the office at [phone].

## 2010-12-08 NOTE — Progress Notes (Signed)
Summary: Med Refill  Phone Note Refill Request  on February 02, 2010 3:50 PM  Refills Requested: Medication #1:  ALPRAZOLAM 0.5 MG  TABS 1 by mouth once daily as needed   Dosage confirmed as above?Dosage Confirmed   Notes: [facility], fax# [phone] Initial call taken by: Scharlene Gloss,  February 02, 2010 3:51 PM  Follow-up for Phone Call        faxed. Follow-up by: Lucious Groves,  February 03, 2010 8:42 AM    New/Updated Medications: ALPRAZOLAM 0.5 MG  TABS (ALPRAZOLAM) 1 by mouth once daily as needed Prescriptions: ALPRAZOLAM 0.5 MG  TABS (ALPRAZOLAM) 1 by mouth once daily as needed  #90 x 5   Entered and Authorized by:   Corwin Levins MD   Signed by:   Corwin Levins MD on 02/02/2010   Method used:   Print then Give to Patient   RxID:   [id number]  done hardcopy to LIM side B - dahlia Corwin Levins MD  February 02, 2010 5:40 PM

## 2010-12-08 NOTE — Assessment & Plan Note (Signed)
Summary: YEARLY FU/ LABS LATER /NWS  #   Vital Signs:  Patient profile:   76 year old male Height:      69 inches Weight:      229 pounds BMI:     33.94 O2 Sat:      94 % on Room air Temp:     98.9 degrees F oral Pulse rate:   69 / minute BP sitting:   130 / 70  (left arm) Cuff size:   large  Vitals Entered By: Zella Ball Ewing CMA Duncan Dull) (September 24, 2010 1:15 PM)  O2 Flow:  Room air  CC: Yearly/RE   Primary Care Provider:  Corwin Levins MD  CC:  Ulla Potash.  History of Present Illness: here fore yearly f/u  - fatigue improved s/p cardioversoin and Pt denies CP, worsening sob, doe, wheezing, orthopnea, pnd, worsening LE edema, palps, dizziness or syncope   Does have worsening dperessive symptoms that per pt seem to recur this time of the year, but denies suicidal ideation or increased anxiety or panic.  has ? insect bite to the left hand medial to the thumb/web space anterior, and several to finger and right foreharnd - s/p pus with a needle at home but spots persists and increasd tender, swollen in the past 2 days  also ? sinus infection - with sob/asthma? with increased wheezing, has used the clarinex and xyzal routinely, wheezing seems to be better with mucinex and omnaris on top of that but concenred about  taking all those meds;  has re-started the inhaler use more freq in the past 3 days but none today so far  Problems Prior to Update: 1)  Cellulitis, Hand, Left  (ICD-682.4) 2)  Preventive Health Care  (ICD-V70.0) 3)  Wheezing  (ICD-786.07) 4)  Angioedema  (ICD-995.1) 5)  Wheezing  (ICD-786.07) 6)  Accidental Falls, Recurrent  (ICD-E888.9) 7)  Organic Impotence  (ICD-607.84) 8)  Dyspnea  (ICD-786.05) 9)  Insomnia-sleep Disorder-unspec  (ICD-780.52) 10)  Sinusitis- Acute-nos  (ICD-461.9) 11)  Hearing Loss  (ICD-389.9) 12)  Onychomycosis, Toenails  (ICD-110.1) 13)  Coumadin Therapy  (ICD-V58.61) 14)  Special Screening Malignant Neoplasm of Prostate  (ICD-V76.44) 15)   Fatigue  (ICD-780.79) 16)  Restless Legs Syndrome  (ICD-333.94) 17)  Obstructive Sleep Apnea  (ICD-327.23) 18)  Restless Legs Syndrome  (ICD-333.94) 19)  Hypersomnia With Sleep Apnea Unspecified  (ICD-780.53) 20)  Sinusitis- Acute-nos  (ICD-461.9) 21)  Family History of Cad Male 1st Degree Relative <50  (ICD-V17.3) 22)  Cholelithiasis  (ICD-574.20) 23)  Benign Prostatic Hypertrophy  (ICD-600.00) 24)  Asthma  (ICD-493.90) 25)  Allergic Rhinitis  (ICD-477.9) 26)  Dementia  (ICD-294.8) 27)  Anxiety  (ICD-300.00) 28)  Atrial Fibrillation  (ICD-427.31) 29)  Syncope  (ICD-780.2) 30)  Cardiac Arrhythmia  (ICD-427.9) 31)  Myocardial Infarction, Hx of  (ICD-412) 32)  Hypertension  (ICD-401.9) 33)  Hyperlipidemia  (ICD-272.4) 34)  Depression  (ICD-311) 35)  Coronary Artery Disease  (ICD-414.00) 36)  Colonic Polyps, Hx of  (ICD-V12.72) 37)  Prostate Cancer, Hx of  (ICD-V10.46) 38)  Colon Cancer, Hx of  (ICD-V10.05)  Medications Prior to Update: 1)  Fluoxetine Hcl 40 Mg  Caps (Fluoxetine Hcl) .... Take 1 By Mouth Once Daily 2)  Multivitamins   Tabs (Multiple Vitamin) .... Take 1 By Mouth Qd 3)  Atenolol 50 Mg  Tabs (Atenolol) .... Take 1 By Mouth Once Daily 4)  Colace 100 Mg  Caps (Docusate Sodium) .... Take 2 By Mouth Qd 5)  Lipitor  40 Mg  Tabs (Atorvastatin Calcium) .... Take 1 By Mouth Once Daily 6)  Celebrex 200 Mg  Caps (Celecoxib) .Marland Kitchen.. 1 By Mouth Two Times A Day As Needed 7)  Cerefolin 5.635-1-50-5 Mg  Tabs (L-Methylfolate-B12-B6-B2) .Marland Kitchen.. 1 By Mouthdaily 8)  Viagra 100 Mg  Tabs (Sildenafil Citrate) .Marland Kitchen.. 1 By Mouth Once Daily As Needed 9)  Clonazepam 1 Mg Tabs (Clonazepam) .Marland Kitchen.. 1 By Mouth At Bedtime As Needed 10)  Warfarin Sodium 5 Mg Tabs (Warfarin Sodium) .... Use As Directed Per Anticoagulation Clinic 11)  Clarinex 5 Mg Tabs (Desloratadine) .Marland Kitchen.. 1 By Mouth Daily 12)  Amlodipine Besylate 5 Mg Tabs (Amlodipine Besylate) .Marland Kitchen.. 1p O Once Daily 13)  Aricept 10 Mg Tabs (Donepezil Hcl) ....  Take 1 Tablet Each Am - Generic 14)  Xyzal 5 Mg Tabs (Levocetirizine Dihydrochloride) .... Take 1 Tablet Each Evening. 15)  Proair Hfa 108 (90 Base) Mcg/act Aers (Albuterol Sulfate) .... 2 Puffs Four Times Per Day As Needed For Wheezing 16)  Alprazolam 0.5 Mg Tabs (Alprazolam) .... As Needed 17)  Mucinex 600 Mg Xr12h-Tab (Guaifenesin) .... As Needed 18)  Omaris Nasal Spray .... Two Times A Day  Current Medications (verified): 1)  Fluoxetine Hcl 40 Mg  Caps (Fluoxetine Hcl) .... Take 1 By Mouth Once Daily 2)  Multivitamins   Tabs (Multiple Vitamin) .... Take 1 By Mouth Qd 3)  Atenolol 50 Mg  Tabs (Atenolol) .... Take 1 By Mouth Once Daily 4)  Colace 100 Mg  Caps (Docusate Sodium) .... Take 2 By Mouth Qd 5)  Lipitor 40 Mg  Tabs (Atorvastatin Calcium) .... Take 1 By Mouth Once Daily 6)  Celebrex 200 Mg  Caps (Celecoxib) .Marland Kitchen.. 1 By Mouth Two Times A Day As Needed 7)  Cerefolin 5.635-1-50-5 Mg  Tabs (L-Methylfolate-B12-B6-B2) .Marland Kitchen.. 1 By Mouthdaily 8)  Viagra 100 Mg  Tabs (Sildenafil Citrate) .Marland Kitchen.. 1 By Mouth Once Daily As Needed 9)  Clonazepam 1 Mg Tabs (Clonazepam) .Marland Kitchen.. 1 By Mouth At Bedtime As Needed 10)  Warfarin Sodium 5 Mg Tabs (Warfarin Sodium) .... Use As Directed Per Anticoagulation Clinic 11)  Clarinex 5 Mg Tabs (Desloratadine) .Marland Kitchen.. 1 By Mouth Daily 12)  Amlodipine Besylate 5 Mg Tabs (Amlodipine Besylate) .Marland Kitchen.. 1p O Once Daily 13)  Aricept 10 Mg Tabs (Donepezil Hcl) .... Take 1 Tablet Each Am - Generic 14)  Xyzal 5 Mg Tabs (Levocetirizine Dihydrochloride) .... Take 1 Tablet Each Evening. 15)  Proair Hfa 108 (90 Base) Mcg/act Aers (Albuterol Sulfate) .... 2 Puffs Four Times Per Day As Needed For Wheezing 16)  Alprazolam 0.5 Mg Tabs (Alprazolam) .... As Needed 17)  Mucinex 600 Mg Xr12h-Tab (Guaifenesin) .... As Needed 18)  Omaris Nasal Spray .... Two Times A Day 19)  Doxycycline Hyclate 100 Mg Caps (Doxycycline Hyclate) .Marland Kitchen.. 1po Two Times A Day 20)  Bupropion Hcl 150 Mg Xr24h-Tab (Bupropion  Hcl) .Marland Kitchen.. 1 By Mouth Once Daily 21)  Pataday 0.2 % Soln (Olopatadine Hcl) .... Use Asd Ou  Two Times A Day As Needed 22)  Vitamin D3 1000 Unit Tabs (Cholecalciferol) .Marland Kitchen.. 1po Once Daily  Allergies (verified): 1)  ! * Lisinopril  Past History:  Past Medical History: Last updated: 08/14/2010 Colon cancer, hx of (removed w/ colonoscopy 2002, in [place]) Prostate cancer, hx of Colonic polyps, hx of Coronary artery disease(CABG in 1995.   Catheterization on February 17, 2007, demonstrated left main 25%, LAD was   occluded, the circumflex had diffuse luminal irregularities, first   obtuse marginal had 80% ostial  stenosis, second obtuse marginal 50% to   60% stenosis, third obtuse marginal 75% proximal and 80% mid stenosis.   The right coronary artery is dominant occluded.  The LIMA to the LAD was   widely patent, vein graft to the PDA was widely patent, vein graft to   third obtuse marginal was widely patent.  The EF 65% with normal wall   motion) Depression Hyperlipidemia Hypertension Myocardial infarction, hx of Atrial fibrillation - s/p cardioversion 2007, 2011 Anxiety Thyroid nodule Dementia - mild Allergic rhinitis Lacunar cva Asthma Benign prostatic hypertrophy Cholelithiasis OSA - CPAP RLS  Past Surgical History: Last updated: 09/29/2009 Coronary artery bypass graft x 3 (1996) Prostatectomy (2001) Cardioversion (2007) Stress Cardiolite (02/08/2007) Echocardiography (10/07/2006) Right thyroid lobectomy  Social History: Last updated: 03/04/2010 Never Smoked Alcohol use-no Married 2 children retired - Psychiatric nurse Drug use-no  Risk Factors: Smoking Status: never (09/28/2007)  Review of Systems       all otherwise negative per pt -    Physical Exam  General:  alert and overweight-appearing.  , mild ill  Head:  normocephalic and atraumatic.   Eyes:  vision grossly intact, pupils equal, and pupils round.   Ears:  bilat tm's midl red, sinus tender  bilat Nose:  nasal dischargemucosal pallor and mucosal edema.   Mouth:  pharyngeal erythema and fair dentition.   Neck:  supple and cervical lymphadenopathy.   Lungs:  normal respiratory effort and normal breath sounds. , no wheezing or rales  Heart:  normal rate and regular rhythm.   Extremities:  no edema, no erythema  Skin:  mild erythema, tender , swelling to web space near thumb base 1 cm area without fluctuation Psych:  depressed affect and slightly anxious.     Impression & Recommendations:  Problem # 1:  CELLULITIS, HAND, LEFT (ICD-682.4)  with ? other folliciultiis - for doxy course  His updated medication list for this problem includes:    Doxycycline Hyclate 100 Mg Caps (Doxycycline hyclate) .Marland Kitchen... 1po two times a day  Problem # 2:  SINUSITIS- ACUTE-NOS (ICD-461.9)  His updated medication list for this problem includes:    Mucinex 600 Mg Xr12h-tab (Guaifenesin) .Marland Kitchen... As needed    Doxycycline Hyclate 100 Mg Caps (Doxycycline hyclate) .Marland Kitchen... 1po two times a day treat as above, f/u any worsening signs or symptoms   Problem # 3:  DEPRESSION (ICD-311)  His updated medication list for this problem includes:    Fluoxetine Hcl 40 Mg Caps (Fluoxetine hcl) .Marland Kitchen... Take 1 by mouth once daily    Clonazepam 1 Mg Tabs (Clonazepam) .Marland Kitchen... 1 by mouth at bedtime as needed    Alprazolam 0.5 Mg Tabs (Alprazolam) .Marland Kitchen... As needed    Bupropion Hcl 150 Mg Xr24h-tab (Bupropion hcl) .Marland Kitchen... 1 by mouth once daily to add the wellbutrin as above  Problem # 4:  ALLERGIC RHINITIS (ICD-477.9)  His updated medication list for this problem includes:    Clarinex 5 Mg Tabs (Desloratadine) .Marland Kitchen... 1 by mouth daily    Xyzal 5 Mg Tabs (Levocetirizine dihydrochloride) .Marland Kitchen... Take 1 tablet each evening. stable overall by hx and exam, ok to continue meds/tx as is - Continue all previous medications as before this visit   Complete Medication List: 1)  Fluoxetine Hcl 40 Mg Caps (Fluoxetine hcl) .... Take 1 by  mouth once daily 2)  Multivitamins Tabs (Multiple vitamin) .... Take 1 by mouth qd 3)  Atenolol 50 Mg Tabs (Atenolol) .... Take 1 by mouth once daily 4)  Colace  100 Mg Caps (Docusate sodium) .... Take 2 by mouth qd 5)  Lipitor 40 Mg Tabs (Atorvastatin calcium) .... Take 1 by mouth once daily 6)  Celebrex 200 Mg Caps (Celecoxib) .Marland Kitchen.. 1 by mouth two times a day as needed 7)  Cerefolin 5.635-1-50-5 Mg Tabs (L-methylfolate-b12-b6-b2) .Marland Kitchen.. 1 by mouthdaily 8)  Viagra 100 Mg Tabs (Sildenafil citrate) .Marland Kitchen.. 1 by mouth once daily as needed 9)  Clonazepam 1 Mg Tabs (Clonazepam) .Marland Kitchen.. 1 by mouth at bedtime as needed 10)  Warfarin Sodium 5 Mg Tabs (Warfarin sodium) .... Use as directed per anticoagulation clinic 11)  Clarinex 5 Mg Tabs (Desloratadine) .Marland Kitchen.. 1 by mouth daily 12)  Amlodipine Besylate 5 Mg Tabs (Amlodipine besylate) .Marland Kitchen.. 1p o once daily 13)  Aricept 10 Mg Tabs (Donepezil hcl) .... Take 1 tablet each am - generic 14)  Xyzal 5 Mg Tabs (Levocetirizine dihydrochloride) .... Take 1 tablet each evening. 15)  Proair Hfa 108 (90 Base) Mcg/act Aers (Albuterol sulfate) .... 2 puffs four times per day as needed for wheezing 16)  Alprazolam 0.5 Mg Tabs (Alprazolam) .... As needed 17)  Mucinex 600 Mg Xr12h-tab (Guaifenesin) .... As needed 18)  Omaris Nasal Spray  .... Two times a day 19)  Doxycycline Hyclate 100 Mg Caps (Doxycycline hyclate) .Marland Kitchen.. 1po two times a day 20)  Bupropion Hcl 150 Mg Xr24h-tab (Bupropion hcl) .Marland Kitchen.. 1 by mouth once daily 21)  Pataday 0.2 % Soln (Olopatadine hcl) .... Use asd ou  two times a day as needed 22)  Vitamin D3 1000 Unit Tabs (Cholecalciferol) .Marland Kitchen.. 1po once daily  Patient Instructions: 1)  Please take all new medications as prescribed  2)  Continue all previous medications as before this visit  3)  Please schedule a follow-up appointment in 6 months, or sooner if needed Prescriptions: PATADAY 0.2 % SOLN (OLOPATADINE HCL) use asd OU  two times a day as needed  #1 x 1    Entered and Authorized by:   Corwin Levins MD   Signed by:   Corwin Levins MD on 09/24/2010   Method used:   [facility] [address] [phone][facility] (retail)       [address]       [place], [place]  [address]       Ph: [phone] or [phone]       Fax: [phone]   RxID:   [id number] BUPROPION HCL 150 MG XR24H-TAB (BUPROPION HCL) 1 by mouth once daily  #30 x 11   Entered and Authorized by:   Corwin Levins MD   Signed by:   Corwin Levins MD on 09/24/2010   Method used:   [facility]        [facility] [address] [phone]* (retail)       [address]       [place], [place]  [address]       Ph: [phone] or [phone]       Fax: [phone]   RxID:   [id number] DOXYCYCLINE HYCLATE 100 MG CAPS (DOXYCYCLINE HYCLATE) 1po two times a day  #20 x 0   Entered and Authorized by:   Corwin Levins MD   Signed by:   Corwin Levins MD on 09/24/2010   Method used:   [facility]        [facility]  [address] [phone][facility] (retail)       [address]       [place], [place]  [address]       Ph: [phone] or [phone]       Fax: [phone]   RxID:   [id number]    Orders Added: 1)  Est. Patient Level IV [332[address]]

## 2010-12-08 NOTE — Assessment & Plan Note (Signed)
Summary: eph/heart cath/mt  Medications Added * OMARIS NASAL SPRAY two times a day        Visit Type:  Follow-up Primary Provider:  Corwin Levins MD  CC:  Atrial Fibrillation.  History of Present Illness: The present for evaluation of atrial fibrillation. He is now status post cardioversion and appears to be holding sinus rhythm. His confusion and weakness which is described in the previous note is much improved in sinus rhythm. He's not noticing any palpitations. He still does have some shortness of breath with wheezing that seems to respond to some degree to his pulmonary medications. He is not describing any presyncope or syncope. He's not had any chest pressure, neck or arm discomfort. He's had some mild ankle edema but this is improved.  Current Medications (verified): 1)  Fluoxetine Hcl 40 Mg  Caps (Fluoxetine Hcl) .... Take 1 By Mouth Once Daily 2)  Multivitamins   Tabs (Multiple Vitamin) .... Take 1 By Mouth Qd 3)  Atenolol 50 Mg  Tabs (Atenolol) .... Take 1 By Mouth Once Daily 4)  Colace 100 Mg  Caps (Docusate Sodium) .... Take 2 By Mouth Qd 5)  Lipitor 40 Mg  Tabs (Atorvastatin Calcium) .... Take 1 By Mouth Once Daily 6)  Celebrex 200 Mg  Caps (Celecoxib) .Marland Kitchen.. 1 By Mouth Two Times A Day As Needed 7)  Cerefolin 5.635-1-50-5 Mg  Tabs (L-Methylfolate-B12-B6-B2) .Marland Kitchen.. 1 By Mouthdaily 8)  Viagra 100 Mg  Tabs (Sildenafil Citrate) .Marland Kitchen.. 1 By Mouth Once Daily As Needed 9)  Clonazepam 1 Mg Tabs (Clonazepam) .Marland Kitchen.. 1 By Mouth At Bedtime As Needed 10)  Warfarin Sodium 5 Mg Tabs (Warfarin Sodium) .... Use As Directed Per Anticoagulation Clinic 11)  Clarinex 5 Mg Tabs (Desloratadine) .Marland Kitchen.. 1 By Mouth Daily 12)  Amlodipine Besylate 5 Mg Tabs (Amlodipine Besylate) .Marland Kitchen.. 1p O Once Daily 13)  Aricept 10 Mg Tabs (Donepezil Hcl) .... Take 1 Tablet Each Am - Generic 14)  Xyzal 5 Mg Tabs (Levocetirizine Dihydrochloride) .... Take 1 Tablet Each Evening. 15)  Proair Hfa 108 (90 Base) Mcg/act Aers  (Albuterol Sulfate) .... 2 Puffs Four Times Per Day As Needed For Wheezing 16)  Alprazolam 0.5 Mg Tabs (Alprazolam) .... As Needed 17)  Mucinex 600 Mg Xr12h-Tab (Guaifenesin) .... As Needed 18)  Omaris Nasal Spray .... Two Times A Day  Allergies: 1)  ! * Lisinopril  Past History:  Past Medical History: Colon cancer, hx of (removed w/ colonoscopy 2002, in [place]) Prostate cancer, hx of Colonic polyps, hx of Coronary artery disease(CABG in 1995.   Catheterization on February 17, 2007, demonstrated left main 25%, LAD was   occluded, the circumflex had diffuse luminal irregularities, first   obtuse marginal had 80% ostial stenosis, second obtuse marginal 50% to   60% stenosis, third obtuse marginal 75% proximal and 80% mid stenosis.   The right coronary artery is dominant occluded.  The LIMA to the LAD was   widely patent, vein graft to the PDA was widely patent, vein graft to   third obtuse marginal was widely patent.  The EF 65% with normal wall   motion) Depression Hyperlipidemia Hypertension Myocardial infarction, hx of Atrial fibrillation - s/p cardioversion 2007, 2011 Anxiety Thyroid nodule Dementia - mild Allergic rhinitis Lacunar cva Asthma Benign prostatic hypertrophy Cholelithiasis OSA - CPAP RLS  Review of Systems       As stated in the HPI and negative for all other systems.   Vital Signs:  Patient profile:   76  year old male Height:      68 inches Weight:      227 pounds BMI:     34.64 Pulse rate:   55 / minute Pulse rhythm:   regular Resp:     18 per minute BP sitting:   124 / 70  (right arm) Cuff size:   large  Vitals Entered By: Vikki Ports (August 14, 2010 3:14 PM)  Physical Exam  General:  poor hygiene.   Head:  normocephalic and atraumatic Eyes:  PERRLA/EOM intact; conjunctiva and lids normal. Neck:  Neck supple, no JVD. No masses, thyromegaly or abnormal cervical nodes. Chest Wall:  Well-healed sternotomy scar Lungs:  Clear bilaterally to  auscultation and percussion. Abdomen:  Bowel sounds positive; abdomen soft and non-tender without masses, organomegaly, or hernias noted. No hepatosplenomegaly, obese Msk:  Back normal, normal gait. Muscle strength and tone normal. Extremities:  No clubbing or cyanosis, Mild left greater than right lower extremity edema, well-healed saphenous vein graft harvest site on the left Neurologic:  Alert and oriented x 3. Skin:  Intact without lesions or rashes. Cervical Nodes:  no significant adenopathy Psych:  Normal affect.   Detailed Cardiovascular Exam  Neck    Carotids: Carotids full and equal bilaterally without bruits.      Neck Veins: Normal, no JVD.    Heart    Inspection: no deformities or lifts noted.      Palpation: normal PMI with no thrills palpable.      Auscultation: Regular rate and rhythm, S1, S2 without murmurs, rubs, gallops, or clicks.    Vascular    Abdominal Aorta: no palpable masses, pulsations, or audible bruits.      Femoral Pulses: normal femoral pulses bilaterally.      Pedal Pulses: normal pedal pulses bilaterally.      Radial Pulses: normal radial pulses bilaterally.     EKG  Procedure date:  08/14/2010  Findings:      Sinus bradycardia, rate 55, axis within normal limits, intervals within normal limits, no acute ST-T wave changes  Impression & Recommendations:  Problem # 1:  ATRIAL FIBRILLATION (ICD-427.31) He seems to be maintaining sinus rhythm and to be improved with this. Therefore, if he converts to fibrillation in the future and feels that all confused her other symptoms I would need to work hard to keep him in sinus rhythm. He will continue anticoagulation.  Problem # 2:  WHEEZING (ICD-786.07) He is still having some wheezing and dyspnea. I will check an echocardiogram as discussed in the previous note.  Problem # 3:  MYOCARDIAL INFARCTION, HX OF (ICD-412) He has had no chest discomfort. He will continue with risk reduction.  Appended  Document: [facility]     Allergies: 1)  ! * Lisinopril   Other Orders: Echocardiogram (Echo)  Patient Instructions: 1)  Your physician recommends that you schedule a follow-up appointment in: 6 months 2)  Your physician has requested that you have an echocardiogram.  Echocardiography is a painless test that uses sound waves to create images of your heart. It provides your doctor with information about the size and shape of your heart and how well your heart's chambers and valves are working.  This procedure takes approximately one hour. There are no restrictions for this procedure.

## 2010-12-08 NOTE — Medication Information (Signed)
Summary: rov/ez  Anticoagulant Therapy  Managed by: Eda Keys, PharmD PCP: Corwin Levins MD Supervising MD: Antoine Poche MD, Fayrene Fearing Indication 1: Atrial Fibrillation (ICD-427.31) Lab Used: [facility] Site: [facility] INR POC 2.6 INR RANGE 2 - 3           Current Medications (verified): 1)  Fluoxetine Hcl 40 Mg  Caps (Fluoxetine Hcl) .... Take 1 By Mouth Once Daily 2)  Multivitamins   Tabs (Multiple Vitamin) .... Take 1 By Mouth Qd 3)  Atenolol 50 Mg  Tabs (Atenolol) .... Take 1 By Mouth Once Daily 4)  Colace 100 Mg  Caps (Docusate Sodium) .... Take 2 By Mouth Qd 5)  Lipitor 40 Mg  Tabs (Atorvastatin Calcium) .... Take 1 By Mouth Once Daily 6)  Celebrex 200 Mg  Caps (Celecoxib) .Marland Kitchen.. 1 By Mouth Two Times A Day As Needed 7)  Cerefolin 5.635-1-50-5 Mg  Tabs (L-Methylfolate-B12-B6-B2) .Marland Kitchen.. 1 By Mouthdaily 8)  Alprazolam 0.5 Mg  Tabs (Alprazolam) .Marland Kitchen.. 1 By Mouth Once Daily As Needed 9)  Viagra 100 Mg  Tabs (Sildenafil Citrate) .Marland Kitchen.. 1 By Mouth Once Daily As Needed 10)  Clonazepam 1 Mg Tabs (Clonazepam) .Marland Kitchen.. 1 By Mouth At Bedtime As Needed 11)  Warfarin Sodium 5 Mg Tabs (Warfarin Sodium) .... Use As Directed Per Anticoagulation Clinic 12)  Clarinex 5 Mg Tabs (Desloratadine) .Marland Kitchen.. 1 By Mouth Daily 13)  Ambien 10 Mg Tabs (Zolpidem Tartrate) .Marland Kitchen.. 1 Poi At Bedtime 14)  Amlodipine Besylate 5 Mg Tabs (Amlodipine Besylate) .Marland Kitchen.. 1p O Once Daily 15)  Cephalexin 500 Mg Caps (Cephalexin) .Marland Kitchen.. 1 By Mouth Three Times A Day 16)  Hydrocodone-Homatropine 5-1.5 Mg/45ml Syrp (Hydrocodone-Homatropine) .Marland Kitchen.. 1 Tsp By Mouth Q 6 Hrs As Needed 17)  Aricept 10 Mg Tabs (Donepezil Hcl) .... Take 1 Tablet Each Am. 18)  Xyzal 5 Mg Tabs (Levocetirizine Dihydrochloride) .... Take 1 Tablet Each Evening.  Allergies (verified): 1)  Ace Inhibitors  Anticoagulation Management History:      The patient is taking warfarin and comes in today for a routine follow up visit.  Positive risk factors for bleeding include  an age of 74 years or older.  The bleeding index is 'intermediate risk'.  Positive CHADS2 values include History of HTN.  Negative CHADS2 values include Age > 74 years old.  The start date was 01/05/2007.  His last INR was 1.7 RATIO.  Anticoagulation responsible provider: Antoine Poche MD, Fayrene Fearing.  INR POC: 2.6.  Cuvette Lot#: [id number].  Exp: 02/2011.    Anticoagulation Management Assessment/Plan:      The patient's current anticoagulation dose is Warfarin sodium 5 mg tabs: Use as directed per anticoagulation clinic.  The target INR is 2.0-3.0.  The next INR is due 01/29/2010.  Anticoagulation instructions were given to patient.  Results were reviewed/authorized by Eda Keys, PharmD.  He was notified by Eda Keys.         Prior Anticoagulation Instructions: INR: 2.7 Continue with same dosage of 5mg  daily except 7.5mg  on Mondays, Wednesdays and Fridays Recheck in 4 weeks  Current Anticoagulation Instructions: INR 2.6  Continue current dosing schedule.  Take 1.5 tablets on Monday, Wednesday, and Friday and take 1 tablet all other days.  Return to clinic in 4 weeks.

## 2010-12-08 NOTE — Medication Information (Signed)
Summary: rov/nb  Anticoagulant Therapy  Managed by: Bethena Midget, RN, BSN PCP: Corwin Levins MD Supervising MD: Graciela Husbands MD, Viviann Spare Indication 1: Atrial Fibrillation (ICD-427.31) Lab Used: [facility] Site: [facility] INR POC 2.2 INR RANGE 2 - 3  Dietary changes: no    Health status changes: no    Bleeding/hemorrhagic complications: no    Recent/future hospitalizations: no    Any changes in medication regimen? no    Recent/future dental: no  Any missed doses?: no       Is patient compliant with meds? yes      Comments: Larey Seat outside 1.5 weeks ago fell off ladder, wife states that they are aware of risk of bleeding with falls, I again encouraged assessment if falls happens again due to risk of internal bleeding.   Allergies: 1)  ! * Lisinopril  Anticoagulation Management History:      The patient is taking warfarin and comes in today for a routine follow up visit.  Positive risk factors for bleeding include an age of 74 years or older.  The bleeding index is 'intermediate risk'.  Positive CHADS2 values include History of HTN.  Negative CHADS2 values include Age > 74 years old.  The start date was 01/05/2007.  His last INR was 1.7 RATIO.  Anticoagulation responsible provider: Graciela Husbands MD, Viviann Spare.  INR POC: 2.2.  Cuvette Lot#: [id number].  Exp: 02//2013.    Anticoagulation Management Assessment/Plan:      The patient's current anticoagulation dose is Warfarin sodium 5 mg tabs: Use as directed per anticoagulation clinic.  The target INR is 2.0-3.0.  The next INR is due 11/12/2010.  Anticoagulation instructions were given to patient.  Results were reviewed/authorized by Bethena Midget, RN, BSN.  He was notified by Bethena Midget, RN, BSN.         Prior Anticoagulation Instructions: INR 2.5  Continue previous dose of 1 tablet everyday except 1.5 tablets on Monday, Wednesday, and Friday.  Recheck INR in 4 weeks  Current Anticoagulation Instructions: INR 2.2 Continue 5mg s daily except  7.5mg s on Mondays, Wednesday and Fridays. Recheck in 4 weeks.

## 2010-12-08 NOTE — Medication Information (Signed)
Summary: rov/eac  Anticoagulant Therapy  Managed by: Cloyde Reams, RN, BSN PCP: Corwin Levins MD Supervising MD: Daleen Squibb MD, Maisie Fus Indication 1: Atrial Fibrillation (ICD-427.31) Lab Used: [facility] Site: [facility] INR POC 2.0 INR RANGE 2 - 3  Dietary changes: no    Health status changes: no    Bleeding/hemorrhagic complications: no    Recent/future hospitalizations: no    Any changes in medication regimen? no    Recent/future dental: no  Any missed doses?: no       Is patient compliant with meds? yes       Allergies: 1)  Ace Inhibitors  Anticoagulation Management History:      The patient is taking warfarin and comes in today for a routine follow up visit.  Positive risk factors for bleeding include an age of 74 years or older.  The bleeding index is 'intermediate risk'.  Positive CHADS2 values include History of HTN.  Negative CHADS2 values include Age > 14 years old.  The start date was 01/05/2007.  His last INR was 1.7 RATIO.  Anticoagulation responsible provider: Daleen Squibb MD, Maisie Fus.  INR POC: 2.0.  Cuvette Lot#: [id number].  Exp: 03/2011.    Anticoagulation Management Assessment/Plan:      The patient's current anticoagulation dose is Warfarin sodium 5 mg tabs: Use as directed per anticoagulation clinic.  The target INR is 2.0-3.0.  The next INR is due 02/26/2010.  Anticoagulation instructions were given to patient.  Results were reviewed/authorized by Cloyde Reams, RN, BSN.  He was notified by Cloyde Reams RN.         Prior Anticoagulation Instructions: INR 2.6  Continue current dosing schedule.  Take 1.5 tablets on Monday, Wednesday, and Friday and take 1 tablet all other days.  Return to clinic in 4 weeks.    Current Anticoagulation Instructions: INR 2.0  Take 7.5mg  today then resume same dosage 5mg  daily except 7.5mg  on Mondays, Wednesdays and Fridays.  Recheck in 4 weeks.

## 2010-12-08 NOTE — Medication Information (Signed)
Summary: rov/sel  Anticoagulant Therapy  Managed by: Weston Brass, PharmD PCP: Corwin Levins MD Supervising MD: Daleen Squibb MD, Maisie Fus Indication 1: Atrial Fibrillation (ICD-427.31) Lab Used: [facility]  Site: [facility] INR POC 2.5 INR RANGE 2 - 3  Dietary changes: no    Health status changes: no    Bleeding/hemorrhagic complications: no    Recent/future hospitalizations: no    Any changes in medication regimen? no    Recent/future dental: no  Any missed doses?: no       Is patient compliant with meds? yes       Allergies (verified): 1)  ! * Lisinopril  Anticoagulation Management History:      The patient is taking warfarin and comes in today for a routine follow up visit.  Positive risk factors for bleeding include an age of 74 years or older.  The bleeding index is 'intermediate risk'.  Positive CHADS2 values include History of HTN.  Negative CHADS2 values include Age > 74 years old.  The start date was 01/05/2007.  His last INR was 1.7 RATIO.  Anticoagulation responsible provider: Daleen Squibb MD, Maisie Fus.  INR POC: 2.5.  Cuvette Lot#: [id number].  Exp: 09/2011.    Anticoagulation Management Assessment/Plan:      The patient's current anticoagulation dose is Warfarin sodium 5 mg tabs: Use as directed per anticoagulation clinic.  The target INR is 2.0-3.0.  The next INR is due 10/08/2010.  Anticoagulation instructions were given to patient.  Results were reviewed/authorized by Weston Brass, PharmD.  He was notified by Hoy Register, PharmD Candidate.         Prior Anticoagulation Instructions: INR 1.9  Take 2 tablets today, then continue taking 1 tablet everyday except 1 1/2 on Monday, Wednesday, and Friday. Recheck in 4 weeks.   Current Anticoagulation Instructions: INR 2.5  Continue previous dose of 1 tablet everyday except 1.5 tablets on Monday, Wednesday, and Friday.  Recheck INR in 4 weeks

## 2010-12-08 NOTE — Assessment & Plan Note (Signed)
Summary: elevated bp/heart skipping beat/fatigue-lb   Vital Signs:  Patient profile:   76 year old male Height:      68 inches Weight:      223.50 pounds BMI:     34.11 O2 Sat:      95 % on Room air Temp:     98.8 degrees F oral Pulse rate:   73 / minute BP sitting:   110 / 72  (left arm) Cuff size:   large  Vitals Entered By: Zella Ball Ewing CMA Duncan Dull) (July 14, 2010 2:55 PM)  O2 Flow:  Room air CC: Elevated BP, confusion/RE   Primary Care Provider:  Corwin Levins MD  CC:  Elevated BP and confusion/RE.  History of Present Illness: here with some /? of concern for stroke;  just back from vacation aug 22, seemed to get overtired and more confused since that time and did not seem to recognize the wife as he has in the past;  was at the chiropracter who though his color was pale and seemed to be more confused than usual;  saw coumadin clinic who noted confusion, and elev BP at that time;  though to have "skipped" heart beats at the chiropracter so unable to get the BP done there;  does tend to have sleep pattern form 4 am to 2 pm in the afternoon, sometimes more naps later in the day;  has good complaiicne with the CPAP except for the occasional nap;  today is "good" day per wife with respect to confusion;  mucinex helps with wheezing at night or during the day;  not using the inhalers;  no other new meds except for occas motrin as needed;  no significant falls or injury though did slip and fall once getting out a small shower in the trailer when they were on vacation;  diet may have been some increased some in calories, and has lost 6 lbs intenttionally when he returned to compensate;    No obvious hallucinations,  agitated, or paranoia.  Main problem overall seems to be fatigue, and may be more depressed, - wife states he does seem to be more affected recently by certain emotional TV shows.  Pt denies CP, worsening sob, doe, wheezing, orthopnea, pnd, worsening LE edema, palps, dizziness or  syncope  Pt denies new neuro symptoms such as headache, facial or extremity weakness , no facial drooping.   Has some mild "crawling" to the back of the legs occaisonally.  No  suicdial ideation or panic.  Overall good complaicne with meds, sees Dr Richardean Chimera Azzie Almas for OSA,  pt denies palpaitations as he felt before with his afib, is s/p cardioversion 2007.  No overt bruise , bleeding on the coumdain, HGB normal last visit.    Problems Prior to Update: 1)  Preventive Health Care  (ICD-V70.0) 2)  Wheezing  (ICD-786.07) 3)  Angioedema  (ICD-995.1) 4)  Wheezing  (ICD-786.07) 5)  Accidental Falls, Recurrent  (ICD-E888.9) 6)  Organic Impotence  (ICD-607.84) 7)  Dyspnea  (ICD-786.05) 8)  Insomnia-sleep Disorder-unspec  (ICD-780.52) 9)  Sinusitis- Acute-nos  (ICD-461.9) 10)  Hearing Loss  (ICD-389.9) 11)  Onychomycosis, Toenails  (ICD-110.1) 12)  Coumadin Therapy  (ICD-V58.61) 13)  Special Screening Malignant Neoplasm of Prostate  (ICD-V76.44) 14)  Fatigue  (ICD-780.79) 15)  Restless Legs Syndrome  (ICD-333.94) 16)  Obstructive Sleep Apnea  (ICD-327.23) 17)  Restless Legs Syndrome  (ICD-333.94) 18)  Hypersomnia With Sleep Apnea Unspecified  (ICD-780.53) 19)  Sinusitis- Acute-nos  (ICD-461.9) 20)  Family History of Cad Male 1st Degree Relative <50  (ICD-V17.3) 21)  Cholelithiasis  (ICD-574.20) 22)  Benign Prostatic Hypertrophy  (ICD-600.00) 23)  Asthma  (ICD-493.90) 24)  Allergic Rhinitis  (ICD-477.9) 25)  Dementia  (ICD-294.8) 26)  Anxiety  (ICD-300.00) 27)  Atrial Fibrillation  (ICD-427.31) 28)  Syncope  (ICD-780.2) 29)  Cardiac Arrhythmia  (ICD-427.9) 30)  Myocardial Infarction, Hx of  (ICD-412) 31)  Hypertension  (ICD-401.9) 32)  Hyperlipidemia  (ICD-272.4) 33)  Depression  (ICD-311) 34)  Coronary Artery Disease  (ICD-414.00) 35)  Colonic Polyps, Hx of  (ICD-V12.72) 36)  Prostate Cancer, Hx of  (ICD-V10.46) 37)  Colon Cancer, Hx of  (ICD-V10.05)  Medications Prior to Update: 1)   Fluoxetine Hcl 40 Mg  Caps (Fluoxetine Hcl) .... Take 1 By Mouth Once Daily 2)  Multivitamins   Tabs (Multiple Vitamin) .... Take 1 By Mouth Qd 3)  Atenolol 50 Mg  Tabs (Atenolol) .... Take 1 By Mouth Once Daily 4)  Colace 100 Mg  Caps (Docusate Sodium) .... Take 2 By Mouth Qd 5)  Lipitor 40 Mg  Tabs (Atorvastatin Calcium) .... Take 1 By Mouth Once Daily 6)  Celebrex 200 Mg  Caps (Celecoxib) .Marland Kitchen.. 1 By Mouth Two Times A Day As Needed 7)  Cerefolin 5.635-1-50-5 Mg  Tabs (L-Methylfolate-B12-B6-B2) .Marland Kitchen.. 1 By Mouthdaily 8)  Alprazolam 0.5 Mg  Tabs (Alprazolam) .Marland Kitchen.. 1 By Mouth Once Daily As Needed 9)  Viagra 100 Mg  Tabs (Sildenafil Citrate) .Marland Kitchen.. 1 By Mouth Once Daily As Needed 10)  Clonazepam 1 Mg Tabs (Clonazepam) .Marland Kitchen.. 1 By Mouth At Bedtime As Needed 11)  Warfarin Sodium 5 Mg Tabs (Warfarin Sodium) .... Use As Directed Per Anticoagulation Clinic 12)  Clarinex 5 Mg Tabs (Desloratadine) .Marland Kitchen.. 1 By Mouth Daily 13)  Zolpidem Tartrate 5 Mg Tabs (Zolpidem Tartrate) .Marland Kitchen.. 1po At Bedtime As Needed 14)  Amlodipine Besylate 5 Mg Tabs (Amlodipine Besylate) .Marland Kitchen.. 1p O Once Daily 15)  Aricept 10 Mg Tabs (Donepezil Hcl) .... Take 1 Tablet Each Am - Generic 16)  Xyzal 5 Mg Tabs (Levocetirizine Dihydrochloride) .... Take 1 Tablet Each Evening. 17)  Proair Hfa 108 (90 Base) Mcg/act Aers (Albuterol Sulfate) .... 2 Puffs Four Times Per Day As Needed For Wheezing  Current Medications (verified): 1)  Fluoxetine Hcl 40 Mg  Caps (Fluoxetine Hcl) .... Take 1 By Mouth Once Daily 2)  Multivitamins   Tabs (Multiple Vitamin) .... Take 1 By Mouth Qd 3)  Atenolol 50 Mg  Tabs (Atenolol) .... Take 1 By Mouth Once Daily 4)  Colace 100 Mg  Caps (Docusate Sodium) .... Take 2 By Mouth Qd 5)  Lipitor 40 Mg  Tabs (Atorvastatin Calcium) .... Take 1 By Mouth Once Daily 6)  Celebrex 200 Mg  Caps (Celecoxib) .Marland Kitchen.. 1 By Mouth Two Times A Day As Needed 7)  Cerefolin 5.635-1-50-5 Mg  Tabs (L-Methylfolate-B12-B6-B2) .Marland Kitchen.. 1 By Mouthdaily 8)   Viagra 100 Mg  Tabs (Sildenafil Citrate) .Marland Kitchen.. 1 By Mouth Once Daily As Needed 9)  Clonazepam 1 Mg Tabs (Clonazepam) .Marland Kitchen.. 1 By Mouth At Bedtime As Needed 10)  Warfarin Sodium 5 Mg Tabs (Warfarin Sodium) .... Use As Directed Per Anticoagulation Clinic 11)  Clarinex 5 Mg Tabs (Desloratadine) .Marland Kitchen.. 1 By Mouth Daily 12)  Zolpidem Tartrate 5 Mg Tabs (Zolpidem Tartrate) .Marland Kitchen.. 1po At Bedtime As Needed 13)  Amlodipine Besylate 5 Mg Tabs (Amlodipine Besylate) .Marland Kitchen.. 1p O Once Daily 14)  Aricept 10 Mg Tabs (Donepezil Hcl) .... Take 1 Tablet Each Am -  Generic 15)  Xyzal 5 Mg Tabs (Levocetirizine Dihydrochloride) .... Take 1 Tablet Each Evening. 16)  Proair Hfa 108 (90 Base) Mcg/act Aers (Albuterol Sulfate) .... 2 Puffs Four Times Per Day As Needed For Wheezing  Allergies (verified): 1)  Ace Inhibitors  Past History:  Social History: Last updated: 03/04/2010 Never Smoked Alcohol use-no Married 2 children retired - Psychiatric nurse Drug use-no  Risk Factors: Smoking Status: never (09/28/2007)  Past Medical History: Colon cancer, hx of (removed w/ colonoscopy 2002, in [place]) Prostate cancer, hx of Colonic polyps, hx of Coronary artery disease(CABG in 1995.   Catheterization on February 17, 2007, demonstrated left main 25%, LAD was   occluded, the circumflex had diffuse luminal irregularities, first   obtuse marginal had 80% ostial stenosis, second obtuse marginal 50% to   60% stenosis, third obtuse marginal 75% proximal and 80% mid stenosis.   The right coronary artery is dominant occluded.  The LIMA to the LAD was   widely patent, vein graft to the PDA was widely patent, vein graft to   third obtuse marginal was widely patent.  The EF 65% with normal wall   motion) Depression Hyperlipidemia Hypertension Myocardial infarction, hx of Atrial fibrillation - s/p cardioversion 2007 - sinus since per wife Anxiety Thyroid nodule Dementia - mild Allergic rhinitis Lacunar cva Asthma Benign  prostatic hypertrophy Cholelithiasis OSA - CPAP RLS  Past Surgical History: Reviewed history from 09/29/2009 and no changes required. Coronary artery bypass graft x 3 (1996) Prostatectomy (2001) Cardioversion (2007) Stress Cardiolite (02/08/2007) Echocardiography (10/07/2006) Right thyroid lobectomy  Review of Systems       all otherwise negative per pt -    Physical Exam  General:  alert and overweight-appearing.   Head:  normocephalic and atraumatic.   Eyes:  vision grossly intact, pupils equal, and pupils round.   Ears:  R ear normal and L ear normal.   Nose:  no external deformity and no nasal discharge.   Mouth:  no gingival abnormalities and pharynx pink and moist.   Neck:  supple and no masses.   Lungs:  normal respiratory effort and normal breath sounds.   Heart:  normal rate and irregular rhythm.  vs regular with ectopics Abdomen:  soft, non-tender, and normal bowel sounds.   Extremities:  no edema, no erythema    Impression & Recommendations:  Problem # 1:  FATIGUE (ICD-780.79)  ? etiology - has mult co-morbids, none of which seem obvoius worse;  exam ow/ benign, to check labs and ecg below; follow with expectant management   Orders: EKG w/ Interpretation (93000) TLB-BMP (Basic Metabolic Panel-BMET) (80048-METABOL) TLB-CBC Platelet - w/Differential (85025-CBCD) TLB-Hepatic/Liver Function Pnl (80076-HEPATIC) TLB-TSH (Thyroid Stimulating Hormone) (84443-TSH) TLB-Sedimentation Rate (ESR) (85652-ESR) TLB-Udip w/ Micro (81001-URINE)  Problem # 2:  HYPERTENSION (ICD-401.9)  His updated medication list for this problem includes:    Atenolol 50 Mg Tabs (Atenolol) .Marland Kitchen... Take 1 by mouth once daily    Amlodipine Besylate 5 Mg Tabs (Amlodipine besylate) .Marland Kitchen... 1p o once daily  BP today: 110/72 Prior BP: 150/90 (07/09/2010)  Labs Reviewed: K+: 4.1 (03/04/2010) Creat: : 0.8 (03/04/2010)   Chol: 133 (03/04/2010)   HDL: 34.10 (03/04/2010)   LDL: 58 (01/01/2008)   TG:  267.0 (03/04/2010) stable overall by hx and exam, ok to continue meds/tx as is   Problem # 3:  DEMENTIA (ICD-294.8) stable overall by hx and exam, ok to continue meds/tx as is   Problem # 4:  DEPRESSION (ICD-311)  The following medications were  removed from the medication list:    Alprazolam 0.5 Mg Tabs (Alprazolam) .Marland Kitchen... 1 by mouth once daily as needed His updated medication list for this problem includes:    Fluoxetine Hcl 40 Mg Caps (Fluoxetine hcl) .Marland Kitchen... Take 1 by mouth once daily    Clonazepam 1 Mg Tabs (Clonazepam) .Marland Kitchen... 1 by mouth at bedtime as needed stable overall by hx and exam, ok to continue meds/tx as is   Problem # 5:  ATRIAL FIBRILLATION (ICD-427.31)  His updated medication list for this problem includes:    Atenolol 50 Mg Tabs (Atenolol) .Marland Kitchen... Take 1 by mouth once daily    Warfarin Sodium 5 Mg Tabs (Warfarin sodium) ..... Use as directed per anticoagulation clinic    Amlodipine Besylate 5 Mg Tabs (Amlodipine besylate) .Marland Kitchen... 1p o once daily ecg shows recurrent afib - rate and volume ok today ;  cont meds as is, refer back to dr hochrein  Orders: Cardiology Referral (Cardiology)  Complete Medication List: 1)  Fluoxetine Hcl 40 Mg Caps (Fluoxetine hcl) .... Take 1 by mouth once daily 2)  Multivitamins Tabs (Multiple vitamin) .... Take 1 by mouth qd 3)  Atenolol 50 Mg Tabs (Atenolol) .... Take 1 by mouth once daily 4)  Colace 100 Mg Caps (Docusate sodium) .... Take 2 by mouth qd 5)  Lipitor 40 Mg Tabs (Atorvastatin calcium) .... Take 1 by mouth once daily 6)  Celebrex 200 Mg Caps (Celecoxib) .Marland Kitchen.. 1 by mouth two times a day as needed 7)  Cerefolin 5.635-1-50-5 Mg Tabs (L-methylfolate-b12-b6-b2) .Marland Kitchen.. 1 by mouthdaily 8)  Viagra 100 Mg Tabs (Sildenafil citrate) .Marland Kitchen.. 1 by mouth once daily as needed 9)  Clonazepam 1 Mg Tabs (Clonazepam) .Marland Kitchen.. 1 by mouth at bedtime as needed 10)  Warfarin Sodium 5 Mg Tabs (Warfarin sodium) .... Use as directed per anticoagulation clinic 11)   Clarinex 5 Mg Tabs (Desloratadine) .Marland Kitchen.. 1 by mouth daily 12)  Zolpidem Tartrate 5 Mg Tabs (Zolpidem tartrate) .Marland Kitchen.. 1po at bedtime as needed 13)  Amlodipine Besylate 5 Mg Tabs (Amlodipine besylate) .Marland Kitchen.. 1p o once daily 14)  Aricept 10 Mg Tabs (Donepezil hcl) .... Take 1 tablet each am - generic 15)  Xyzal 5 Mg Tabs (Levocetirizine dihydrochloride) .... Take 1 tablet each evening. 16)  Proair Hfa 108 (90 Base) Mcg/act Aers (Albuterol sulfate) .... 2 puffs four times per day as needed for wheezing  Other Orders: Flu Vaccine 84yrs + MEDICARE PATIENTS (R1540) Administration Flu vaccine - MCR (G8676)  Patient Instructions: 1)  Please go to the Lab in the basement for your blood and/or urine tests today 2)  Continue all previous medications as before this visit  3)  You will be contacted about the referral(s) to: Dr Hochrein/card 4)  you can cancel the planned appt with me for oct if you have one;  rather we can re-schedule for 6 months    Flu Vaccine Consent Questions     Do you have a history of severe allergic reactions to this vaccine? no    Any prior history of allergic reactions to egg and/or gelatin? no    Do you have a sensitivity to the preservative Thimersol? no    Do you have a past history of Guillan-Barre Syndrome? no    Do you currently have an acute febrile illness? no    Have you ever had a severe reaction to latex? no    Vaccine information given and explained to patient? yes    Are you currently pregnant? no  Lot Number:[id number]   Exp Date:05/08/2011   Site Given  Left Deltoid IMlu

## 2010-12-08 NOTE — Medication Information (Signed)
Summary: rov/sp  Anticoagulant Therapy  Managed by: Eda Keys, PharmD PCP: Corwin Levins MD Supervising MD: Graciela Husbands MD,Hisako Bugh Indication 1: Atrial Fibrillation (ICD-427.31) Lab Used: [facility] Site: [facility] INR POC 2.4 INR RANGE 2 - 3  Vital Signs: Pulse Rate: 80  Blood Pressure:  150 / 90   Dietary changes: no    Health status changes: no    Bleeding/hemorrhagic complications: no    Recent/future hospitalizations: no    Any changes in medication regimen? no    Recent/future dental: no  Any missed doses?: yes     Details: possibly missed a dose 3 weeks ago  Is patient compliant with meds? yes       Allergies: 1)  Ace Inhibitors  Anticoagulation Management History:      The patient is taking warfarin and comes in today for a routine follow up visit.  Positive risk factors for bleeding include an age of 74 years or older.  The bleeding index is 'intermediate risk'.  Positive CHADS2 values include History of HTN.  Negative CHADS2 values include Age > 53 years old.  The start date was 01/05/2007.  His last INR was 1.7 RATIO.  Anticoagulation responsible provider: Graciela Husbands MD,Charmine Bockrath.  INR POC: 2.4.  Cuvette Lot#: [id number].  Exp: 08/2011.    Anticoagulation Management Assessment/Plan:      The patient's current anticoagulation dose is Warfarin sodium 5 mg tabs: Use as directed per anticoagulation clinic.  The target INR is 2.0-3.0.  The next INR is due 08/06/2010.  Anticoagulation instructions were given to patient.  Results were reviewed/authorized by Eda Keys, PharmD.  He was notified by Harrel Carina, PharmD candidate.         Prior Anticoagulation Instructions: INR 3.4  Skip today's dose of Coumadin then decrease dose to 1 tablet every day except 1 1/2 tablets on Monday and Friday.   Current Anticoagulation Instructions: INR 2.4  Continue taking 1 tablet every day except take 1 1/2 tablets on Mondays, Wednesday, and Fridays. Re-check INR on 08/06/10.     Appended Document: rov/sp pt was taking 1.5tab on MWF INR 2.4 therefore changed his scedule in computer to match - [facility]

## 2010-12-08 NOTE — Assessment & Plan Note (Signed)
Summary: [name] [name]/afib/jml  Medications Added ALPRAZOLAM 0.5 MG TABS (ALPRAZOLAM) as needed MUCINEX 600 MG XR12H-TAB (GUAIFENESIN) as needed        Visit Type:  Follow-up Primary Provider:  Corwin Levins MD  CC:  atrial fibrillation.  History of Present Illness: The patient was added on to the schedule today as he was recently found to be back in atrial fibrillation. He had had this rhythm in the past treated with cardioversion but had maintained sinus rhythm for years. Not entirely clear when he had the onset of this but his wife thinks it has been for a few weeks. In August they went on vacation and he was more fatigued and more confused than he had been prior to this. When he sits quietly and thinks about it he will notice his heart skipping or racing. He's not had any frank syncope or presyncope. He has had some increased mild lower extremity swelling. He is not describing any PND or orthopnea. He is not describing any chest pressure, neck or arm discomfort.  Current Medications (verified): 1)  Fluoxetine Hcl 40 Mg  Caps (Fluoxetine Hcl) .... Take 1 By Mouth Once Daily 2)  Multivitamins   Tabs (Multiple Vitamin) .... Take 1 By Mouth Qd 3)  Atenolol 50 Mg  Tabs (Atenolol) .... Take 1 By Mouth Once Daily 4)  Colace 100 Mg  Caps (Docusate Sodium) .... Take 2 By Mouth Qd 5)  Lipitor 40 Mg  Tabs (Atorvastatin Calcium) .... Take 1 By Mouth Once Daily 6)  Celebrex 200 Mg  Caps (Celecoxib) .Marland Kitchen.. 1 By Mouth Two Times A Day As Needed 7)  Cerefolin 5.635-1-50-5 Mg  Tabs (L-Methylfolate-B12-B6-B2) .Marland Kitchen.. 1 By Mouthdaily 8)  Viagra 100 Mg  Tabs (Sildenafil Citrate) .Marland Kitchen.. 1 By Mouth Once Daily As Needed 9)  Clonazepam 1 Mg Tabs (Clonazepam) .Marland Kitchen.. 1 By Mouth At Bedtime As Needed 10)  Warfarin Sodium 5 Mg Tabs (Warfarin Sodium) .... Use As Directed Per Anticoagulation Clinic 11)  Clarinex 5 Mg Tabs (Desloratadine) .Marland Kitchen.. 1 By Mouth Daily 12)  Amlodipine Besylate 5 Mg Tabs (Amlodipine Besylate) .Marland Kitchen.. 1p O Once  Daily 13)  Aricept 10 Mg Tabs (Donepezil Hcl) .... Take 1 Tablet Each Am - Generic 14)  Xyzal 5 Mg Tabs (Levocetirizine Dihydrochloride) .... Take 1 Tablet Each Evening. 15)  Proair Hfa 108 (90 Base) Mcg/act Aers (Albuterol Sulfate) .... 2 Puffs Four Times Per Day As Needed For Wheezing 16)  Alprazolam 0.5 Mg Tabs (Alprazolam) .... As Needed 17)  Mucinex 600 Mg Xr12h-Tab (Guaifenesin) .... As Needed  Allergies: 1)  Ace Inhibitors  Past History:  Past Medical History: Reviewed history from 07/14/2010 and no changes required. Colon cancer, hx of (removed w/ colonoscopy 2002, in [place]) Prostate cancer, hx of Colonic polyps, hx of Coronary artery disease(CABG in 1995.   Catheterization on February 17, 2007, demonstrated left main 25%, LAD was   occluded, the circumflex had diffuse luminal irregularities, first   obtuse marginal had 80% ostial stenosis, second obtuse marginal 50% to   60% stenosis, third obtuse marginal 75% proximal and 80% mid stenosis.   The right coronary artery is dominant occluded.  The LIMA to the LAD was   widely patent, vein graft to the PDA was widely patent, vein graft to   third obtuse marginal was widely patent.  The EF 65% with normal wall   motion) Depression Hyperlipidemia Hypertension Myocardial infarction, hx of Atrial fibrillation - s/p cardioversion 2007 - sinus since per wife Anxiety Thyroid nodule  Dementia - mild Allergic rhinitis Lacunar cva Asthma Benign prostatic hypertrophy Cholelithiasis OSA - CPAP RLS  Past Surgical History: Reviewed history from 09/29/2009 and no changes required. Coronary artery bypass graft x 3 (1996) Prostatectomy (2001) Cardioversion (2007) Stress Cardiolite (02/08/2007) Echocardiography (10/07/2006) Right thyroid lobectomy  Review of Systems       As stated in the HPI and negative for all other systems.   Vital Signs:  Patient profile:   76 year old male Height:      68 inches Weight:      226  pounds BMI:     34.49 O2 Sat:      95 % Pulse rate:   65 / minute BP sitting:   114 / 68  (left arm)  Vitals Entered By: Laurance Flatten CMA (July 21, 2010 3:57 PM)  Physical Exam  General:  poor hygiene.   Head:  normocephalic and atraumatic Eyes:  PERRLA/EOM intact; conjunctiva and lids normal. Mouth:  Teeth, gums and palate normal. Oral mucosa normal. Neck:  Neck supple, no JVD. No masses, thyromegaly or abnormal cervical nodes. Chest Wall:  Well-healed sternotomy scar Lungs:  Clear bilaterally to auscultation and percussion. Abdomen:  Bowel sounds positive; abdomen soft and non-tender without masses, organomegaly, or hernias noted. No hepatosplenomegaly, obese Msk:  Back normal, normal gait. Muscle strength and tone normal. Extremities:  No clubbing or cyanosis, Mild left greater than right lower extremity edema, well-healed saphenous vein graft harvest site on the left Neurologic:  Alert and oriented x 3. Skin:  Intact without lesions or rashes. Cervical Nodes:  no significant adenopathy Inguinal Nodes:  no significant adenopathy Psych:  Normal affect.   Detailed Cardiovascular Exam  Neck    Carotids: Carotids full and equal bilaterally without bruits.      Neck Veins: Normal, no JVD.    Heart    Inspection: no deformities or lifts noted.      Palpation: normal PMI with no thrills palpable.      Auscultation: irregular rate and rhythm, S1, S2 without murmurs, rubs, gallops, or clicks.    Vascular    Abdominal Aorta: no palpable masses, pulsations, or audible bruits.      Femoral Pulses: normal femoral pulses bilaterally.      Pedal Pulses: normal pedal pulses bilaterally.      Radial Pulses: normal radial pulses bilaterally.     Impression & Recommendations:  Problem # 1:  ATRIAL FIBRILLATION (ICD-427.31) The patient is back in atrial fibrillation. He still in fibrillation today by exam though I did not repeat an EKG. I did review the EKG from 96. It may well be  contributing to some of the complaints described and that noted above. It is very reasonable to try to get him back in normal rhythm. He understands the risks and benefits of cardioversion. He has been on therapeutic Coumadin. This was checked recently and will be checked again the day of his procedure. I will bring him back for elective cardioversion.  Problem # 2:  DYSPNEA (ICD-786.05) I will assess with an echocardiogram following his cardioversion.  Problem # 3:  CORONARY ARTERY DISEASE (ICD-414.00) He had a negative stress echocardiogram study in 2008.  He is not describing ongoing angina. He will continue with risk reduction.  Problem # 4:  HYPERTENSION (ICD-401.9) His blood pressure is controlled. He will continue the meds as listed.  Patient Instructions: 1)  Your physician recommends that you schedule a follow-up appointment in: after your cardioversion 2)  Your physician  recommends that you continue on your current medications as directed. Please refer to the Current Medication list given to you today. 3)  Your physician has recommended that you have a cardioversion (DCCV).  Electrical cardioversion uses a jolt of electricity to your heart either through paddles or wired patches attached to your chest. This is a controlled, usually prescheduled, procedure. Defibrillation is done under light anesthesia in the hospital, and you usually go home the day of the procedure. This is done to get your heart back into a normal rhythm. You are not awake for the procedure. Please see the instruction sheet given to you today.

## 2010-12-08 NOTE — Consult Note (Signed)
Summary: [facility]  [facility]   Imported By: Sherian Rein 12/23/2009 08:31:35  _____________________________________________________________________  External Attachment:    Type:   Image     Comment:   External Document

## 2010-12-08 NOTE — Medication Information (Signed)
Summary: rov/tm  Anticoagulant Therapy  Managed by: Weston Brass, PharmD PCP: Corwin Levins MD Supervising MD: Tenny Craw MD, Gunnar Fusi Indication 1: Atrial Fibrillation (ICD-427.31) Lab Used: [facility] Site: [facility] INR POC 3.0 INR RANGE 2 - 3  Dietary changes: no    Health status changes: no    Bleeding/hemorrhagic complications: no    Recent/future hospitalizations: no    Any changes in medication regimen? no    Recent/future dental: no  Any missed doses?: no       Is patient compliant with meds? yes       Allergies: 1)  Ace Inhibitors  Anticoagulation Management History:      The patient is taking warfarin and comes in today for a routine follow up visit.  Positive risk factors for bleeding include an age of 74 years or older.  The bleeding index is 'intermediate risk'.  Positive CHADS2 values include History of HTN.  Negative CHADS2 values include Age > 38 years old.  The start date was 01/05/2007.  His last INR was 1.7 RATIO.  Anticoagulation responsible provider: Tenny Craw MD, Gunnar Fusi.  INR POC: 3.0.  Cuvette Lot#: [id number].  Exp: 06/2011.    Anticoagulation Management Assessment/Plan:      The patient's current anticoagulation dose is Warfarin sodium 5 mg tabs: Use as directed per anticoagulation clinic.  The target INR is 2.0-3.0.  The next INR is due 05/14/2010.  Anticoagulation instructions were given to patient.  Results were reviewed/authorized by Weston Brass, PharmD.  He was notified by Weston Brass PharmD.         Prior Anticoagulation Instructions: INR 2.9 Continue 5mg s everyday except 7.5mg s on Mondays, Wednesdays and Fridays. Recheck in 4 weeks.   Current Anticoagulation Instructions: INR 3.0  Continue same dose of 1 tablet every day except 1 1/2 tablets on Monday, Wednesday, and Friday

## 2010-12-08 NOTE — Medication Information (Signed)
Summary: [name] [name]   Anticoagulant Therapy  Managed by: Weston Brass, PharmD PCP: Corwin Levins MD Supervising MD: Graciela Husbands MD,Steven Indication 1: Atrial Fibrillation (ICD-427.31) Lab Used: [facility] Site: [facility] INR POC 1.9 INR RANGE 2 - 3  Dietary changes: no    Health status changes: yes       Details: cardioversion   Bleeding/hemorrhagic complications: no    Recent/future hospitalizations: yes       Details: cardioversion  Any changes in medication regimen? no    Recent/future dental: no  Any missed doses?: yes     Details: missed one dose last monday  Is patient compliant with meds? yes       Allergies: 1)  Ace Inhibitors  Anticoagulation Management History:      The patient is taking warfarin and comes in today for a routine follow up visit.  Positive risk factors for bleeding include an age of 74 years or older.  The bleeding index is 'intermediate risk'.  Positive CHADS2 values include History of HTN.  Negative CHADS2 values include Age > 10 years old.  The start date was 01/05/2007.  His last INR was 1.7 RATIO.  Anticoagulation responsible provider: Graciela Husbands MD,Steven.  INR POC: 1.9.  Cuvette Lot#: [id number].  Exp: 09/2011.    Anticoagulation Management Assessment/Plan:      The patient's current anticoagulation dose is Warfarin sodium 5 mg tabs: Use as directed per anticoagulation clinic.  The target INR is 2.0-3.0.  The next INR is due 09/10/2010.  Anticoagulation instructions were given to patient.  Results were reviewed/authorized by Weston Brass, PharmD.  He was notified by Ilean Skill D candidate.         Prior Anticoagulation Instructions: INR 2.4  Continue taking 1 tablet every day except take 1 1/2 tablets on Mondays, Wednesday, and Fridays. Re-check INR on 08/06/10.    Current Anticoagulation Instructions: INR 1.9  Take 2 tablets today, then continue taking 1 tablet everyday except 1 1/2 on Monday, Wednesday, and Friday. Recheck in 4 weeks.

## 2010-12-08 NOTE — Medication Information (Signed)
Summary: rov/sp  Anticoagulant Therapy  Managed by: Bethena Midget, RN, BSN PCP: Corwin Levins MD Supervising MD: Antoine Poche MD, Fayrene Fearing Indication 1: Atrial Fibrillation (ICD-427.31) Lab Used: [facility] Site: [facility] INR POC 2.9 INR RANGE 2 - 3  Dietary changes: no    Health status changes: no    Bleeding/hemorrhagic complications: no    Recent/future hospitalizations: no    Any changes in medication regimen? no    Recent/future dental: no  Any missed doses?: no       Is patient compliant with meds? yes       Allergies: 1)  Ace Inhibitors  Anticoagulation Management History:      The patient is taking warfarin and comes in today for a routine follow up visit.  Positive risk factors for bleeding include an age of 74 years or older.  The bleeding index is 'intermediate risk'.  Positive CHADS2 values include History of HTN.  Negative CHADS2 values include Age > 74 years old.  The start date was 01/05/2007.  His last INR was 1.7 RATIO.  Anticoagulation responsible provider: Antoine Poche MD, Fayrene Fearing.  INR POC: 2.9.  Cuvette Lot#: [id number].  Exp: 06/2011.    Anticoagulation Management Assessment/Plan:      The patient's current anticoagulation dose is Warfarin sodium 5 mg tabs: Use as directed per anticoagulation clinic.  The target INR is 2.0-3.0.  The next INR is due 04/17/2010.  Anticoagulation instructions were given to patient.  Results were reviewed/authorized by Bethena Midget, RN, BSN.  He was notified by Bethena Midget, RN, BSN.         Prior Anticoagulation Instructions: INR 2.2  Continue same dose of 1 tablet every day except 1 1/2 tablets on Monday, Wednesday and Friday   Current Anticoagulation Instructions: INR 2.9 Continue 5mg s everyday except 7.5mg s on Mondays, Wednesdays and Fridays. Recheck in 4 weeks.

## 2010-12-08 NOTE — Progress Notes (Signed)
Summary: Medication refill  Phone Note Refill Request Message from:  Fax from Pharmacy on September 14, 2010 1:44 PM  Refills Requested: Medication #1:  CLONAZEPAM 1 MG TABS 1 by mouth at bedtime as needed   Dosage confirmed as above?Dosage Confirmed   Last Refilled: 03/04/2010   Notes: [facility], fax#[phone] Initial call taken by: Robin Ewing CMA Duncan Dull),  September 14, 2010 1:44 PM    Prescriptions: CLONAZEPAM 1 MG TABS (CLONAZEPAM) 1 by mouth at bedtime as needed  #90 x 1   Entered and Authorized by:   Corwin Levins MD   Signed by:   Corwin Levins MD on 09/14/2010   Method used:   Print then Give to Patient   RxID:   [id number]  done hardcopy to [facility] side B - dahlia Corwin Levins MD  September 14, 2010 5:29 PM   Rx faxed to pharmacy Margaret Pyle, CMA  September 15, 2010 8:14 AM

## 2010-12-08 NOTE — Medication Information (Signed)
Summary: rov/sp  Anticoagulant Therapy  Managed by: Weston Brass, PharmD PCP: Corwin Levins MD Supervising MD: Ladona Ridgel MD, Sharlot Gowda Indication 1: Atrial Fibrillation (ICD-427.31) Lab Used: [facility] Site: [facility] INR RANGE 2 - 3  Dietary changes: no    Health status changes: no    Bleeding/hemorrhagic complications: no    Recent/future hospitalizations: no    Any changes in medication regimen? no    Recent/future dental: no  Any missed doses?: no       Is patient compliant with meds? yes       Allergies: 1)  Ace Inhibitors  Anticoagulation Management History:      The patient is taking warfarin and comes in today for a routine follow up visit.  Positive risk factors for bleeding include an age of 25 years or older.  The bleeding index is 'intermediate risk'.  Positive CHADS2 values include History of HTN.  Negative CHADS2 values include Age > 82 years old.  The start date was 01/05/2007.  His last INR was 1.7 RATIO.  Anticoagulation responsible provider: Ladona Ridgel MD, Sharlot Gowda.  Cuvette Lot#: [id number].  Exp: 07/2011.    Anticoagulation Management Assessment/Plan:      The patient's current anticoagulation dose is Warfarin sodium 5 mg tabs: Use as directed per anticoagulation clinic.  The target INR is 2.0-3.0.  The next INR is due 06/04/2010.  Anticoagulation instructions were given to patient.  Results were reviewed/authorized by Weston Brass, PharmD.  He was notified by Weston Brass PharmD.         Prior Anticoagulation Instructions: INR 3.0  Continue same dose of 1 tablet every day except 1 1/2 tablets on Monday, Wednesday, and Friday  Current Anticoagulation Instructions: INR 3.4  Skip today's dose of Coumadin then decrease dose to 1 tablet every day except 1 1/2 tablets on Monday and Friday.

## 2010-12-08 NOTE — Miscellaneous (Signed)
Summary: Appointment Canceled  Appointment status changed to canceled by [employer] on 08/19/2010 1:28 PM.  Cancellation Comments --------------------- echo dx 786.50/medicare/bcbs/sl  Appointment Information ----------------------- Appt Type:  CARDIOLOGY ANCILLARY VISIT      Date:  Tuesday, August 25, 2010      Time:  4:00 PM for 60 min   Urgency:  Routine   Made By:  Hoy Finlay Scheduler  To Visit:  [id number]    Reason:  echo dx 786.50/medicare/bcbs/sl  Appt Comments ------------- -- 08/19/10 13:28: ([facility]) CANCELED -- echo dx 786.50/medicare/bcbs/sl -- 08/19/10 13:21: ([facility]) CANCELED -- echo dx 786.50/medicare/bcbs/sl -- 08/14/10 16:25: ([facility]) BOOKED -- Routine CARDIOLOGY ANCILLARY VISIT at 08/25/2010 4:00 PM for 60 min echo d

## 2010-12-08 NOTE — Medication Information (Signed)
Summary: rov/ewj  Anticoagulant Therapy  Managed by: Shelby Dubin, PharmD, BCPS, CPP PCP: Corwin Levins MD Supervising MD: Jens Som MD, Arlys Larry Indication 1: Atrial Fibrillation (ICD-427.31) Lab Used: [facility] Site: [facility] INR POC 2.7 INR RANGE 2 - 3  Dietary changes: no    Health status changes: no    Bleeding/hemorrhagic complications: no    Recent/future hospitalizations: no    Any changes in medication regimen? no    Recent/future dental: no  Any missed doses?: no       Is patient compliant with meds? yes       Allergies (verified): 1)  Ace Inhibitors  Anticoagulation Management History:      The patient is taking warfarin and comes in today for a routine follow up visit.  Positive risk factors for bleeding include an age of 74 years or older.  The bleeding index is 'intermediate risk'.  Positive CHADS2 values include History of HTN.  Negative CHADS2 values include Age > 15 years old.  The start date was 01/05/2007.  His last INR was 1.7 RATIO.  Anticoagulation responsible provider: Jens Som MD, Arlys Wilmer.  INR POC: 2.7.  Cuvette Lot#: [id number].  Exp: 02/2011.    Anticoagulation Management Assessment/Plan:      The patient's current anticoagulation dose is Warfarin sodium 5 mg tabs: Use as directed per anticoagulation clinic.  The target INR is 2.0-3.0.  The next INR is due 12/22/2009.  Anticoagulation instructions were given to patient.  Results were reviewed/authorized by Shelby Dubin, PharmD, BCPS, CPP.  He was notified by Ysidro Evert, Pharm D Candidate.         Prior Anticoagulation Instructions: INR: 1.8  Take 1 1/2 today then resume with the same dosing regimen of 1 tablet daily except 1 1/2 tablet on Mondays, Wednesdays, and Friday.   Recheck in 3 weeks.  Current Anticoagulation Instructions: INR: 2.7 Continue with same dosage of 5mg  daily except 7.5mg  on Mondays, Wednesdays and Fridays Recheck in 4 weeks

## 2010-12-08 NOTE — Medication Information (Signed)
Summary: rov/ewj  Anticoagulant Therapy  Managed by: Weston Brass, PharmD PCP: Corwin Levins MD Supervising MD: Myrtis Ser MD, Tinnie Gens Indication 1: Atrial Fibrillation (ICD-427.31) Lab Used: [facility] Site: [facility] INR POC 2.2 INR RANGE 2 - 3  Dietary changes: no    Health status changes: no    Bleeding/hemorrhagic complications: no    Recent/future hospitalizations: no    Any changes in medication regimen? no    Recent/future dental: no  Any missed doses?: no       Is patient compliant with meds? no     Details: sometimes misses his morning medications   Allergies: 1)  Ace Inhibitors  Anticoagulation Management History:      The patient is taking warfarin and comes in today for a routine follow up visit.  Positive risk factors for bleeding include an age of 74 years or older.  The bleeding index is 'intermediate risk'.  Positive CHADS2 values include History of HTN.  Negative CHADS2 values include Age > 74 years old.  The start date was 01/05/2007.  His last INR was 1.7 RATIO.  Anticoagulation responsible provider: Myrtis Ser MD, Tinnie Gens.  INR POC: 2.2.  Cuvette Lot#: [id number].  Exp: 04/2011.    Anticoagulation Management Assessment/Plan:      The patient's current anticoagulation dose is Warfarin sodium 5 mg tabs: Use as directed per anticoagulation clinic.  The target INR is 2.0-3.0.  The next INR is due 03/26/2010.  Anticoagulation instructions were given to patient.  Results were reviewed/authorized by Weston Brass, PharmD.  He was notified by Weston Brass PharmD.         Prior Anticoagulation Instructions: INR 2.0  Take 7.5mg  today then resume same dosage 5mg  daily except 7.5mg  on Mondays, Wednesdays and Fridays.  Recheck in 4 weeks.    Current Anticoagulation Instructions: INR 2.2  Continue same dose of 1 tablet every day except 1 1/2 tablets on Monday, Wednesday and Friday

## 2010-12-08 NOTE — Progress Notes (Signed)
Summary: med refill  Phone Note Refill Request  on November 28, 2009 2:33 PM  Refills Requested: Medication #1:  CLONAZEPAM 1 MG TABS 1 by mouth at bedtime as needed   Dosage confirmed as above?Dosage Confirmed   Last Refilled: 04/03/2009   Notes: [facility] fax#[phone] Initial call taken by: Scharlene Gloss,  November 28, 2009 2:34 PM  Follow-up for Phone Call        done hardcopy to [facility] side B - dahlia  Follow-up by: Corwin Levins MD,  November 28, 2009 3:57 PM  Additional Follow-up for Phone Call Additional follow up Details #1::        rx faxed to pharmacy Additional Follow-up by: Margaret Pyle, CMA,  November 28, 2009 4:02 PM    Prescriptions: CLONAZEPAM 1 MG TABS (CLONAZEPAM) 1 by mouth at bedtime as needed  #90 x 1   Entered and Authorized by:   Corwin Levins MD   Signed by:   Corwin Levins MD on 11/28/2009   Method used:   Print then Give to Patient   RxID:   [id number]

## 2010-12-08 NOTE — Assessment & Plan Note (Signed)
Summary: FU--STC   Vital Signs:  Patient profile:   76 year old male Height:      68 inches Weight:      223.25 pounds BMI:     34.07 O2 Sat:      93 % on Room air Temp:     96.8 degrees F oral Pulse rate:   81 / minute BP sitting:   120 / 62  (left arm) Cuff size:   large  Vitals Entered ByZella Ball Ewing (March 04, 2010 2:50 PM)  O2 Flow:  Room air  CC: followup/RE   Primary Care Provider:  Corwin Levins MD  CC:  followup/RE.  History of Present Illness: overall doing well it seems, has seen cards/dr hochrein recently;  pt with several days onset mild wheezing and slight sob though functionally no change in ability to ambulate;  Pt denies CP, orthopnea, pnd, worsening LE edema, palps, dizziness or syncope, but hx limited due to dementia.  Pt denies new neuro symptoms such as headache, facial or extremity weakness   Wife reports overall doing well wtihout worsening agitation, paranoia, aggression or psychosis. She helps insure good med complaince and seems to tolerate well.  Here for wellness Diet: Heart Healthy or DM if diabetic Physical Activities: Sedentary Depression/mood screen: Negative Hearing: Intact bilateral Visual Acuity: Grossly normal, gets exam yearly ADL's: Capable but needs 24hr supervision Fall Risk: mild Home Safety: Good Cognitive Impairment:  Gen appearance, affect, speech, (has memory loss chronic), attention & motor skills grossly intact End-of-Life Planning: Advance directive - Full code/I agree   Preventive Screening-Counseling & Management      Drug Use:  no.    Problems Prior to Update: 1)  Wheezing  (ICD-786.07) 2)  Angioedema  (ICD-995.1) 3)  Wheezing  (ICD-786.07) 4)  Accidental Falls, Recurrent  (ICD-E888.9) 5)  Organic Impotence  (ICD-607.84) 6)  Dyspnea  (ICD-786.05) 7)  Insomnia-sleep Disorder-unspec  (ICD-780.52) 8)  Sinusitis- Acute-nos  (ICD-461.9) 9)  Hearing Loss  (ICD-389.9) 10)  Onychomycosis, Toenails  (ICD-110.1) 11)   Coumadin Therapy  (ICD-V58.61) 12)  Special Screening Malignant Neoplasm of Prostate  (ICD-V76.44) 13)  Fatigue  (ICD-780.79) 14)  Restless Legs Syndrome  (ICD-333.94) 15)  Obstructive Sleep Apnea  (ICD-327.23) 16)  Restless Legs Syndrome  (ICD-333.94) 17)  Hypersomnia With Sleep Apnea Unspecified  (ICD-780.53) 18)  Sinusitis- Acute-nos  (ICD-461.9) 19)  Family History of Cad Male 1st Degree Relative <50  (ICD-V17.3) 20)  Cholelithiasis  (ICD-574.20) 21)  Benign Prostatic Hypertrophy  (ICD-600.00) 22)  Asthma  (ICD-493.90) 23)  Allergic Rhinitis  (ICD-477.9) 24)  Dementia  (ICD-294.8) 25)  Anxiety  (ICD-300.00) 26)  Atrial Fibrillation  (ICD-427.31) 27)  Syncope  (ICD-780.2) 28)  Cardiac Arrhythmia  (ICD-427.9) 29)  Myocardial Infarction, Hx of  (ICD-412) 30)  Hypertension  (ICD-401.9) 31)  Hyperlipidemia  (ICD-272.4) 32)  Depression  (ICD-311) 33)  Coronary Artery Disease  (ICD-414.00) 34)  Colonic Polyps, Hx of  (ICD-V12.72) 35)  Prostate Cancer, Hx of  (ICD-V10.46) 36)  Colon Cancer, Hx of  (ICD-V10.05)  Medications Prior to Update: 1)  Fluoxetine Hcl 40 Mg  Caps (Fluoxetine Hcl) .... Take 1 By Mouth Once Daily 2)  Multivitamins   Tabs (Multiple Vitamin) .... Take 1 By Mouth Qd 3)  Atenolol 50 Mg  Tabs (Atenolol) .... Take 1 By Mouth Once Daily 4)  Colace 100 Mg  Caps (Docusate Sodium) .... Take 2 By Mouth Qd 5)  Lipitor 40 Mg  Tabs (Atorvastatin Calcium) .... Take  1 By Mouth Once Daily 6)  Celebrex 200 Mg  Caps (Celecoxib) .Marland Kitchen.. 1 By Mouth Two Times A Day As Needed 7)  Cerefolin 5.635-1-50-5 Mg  Tabs (L-Methylfolate-B12-B6-B2) .Marland Kitchen.. 1 By Mouthdaily 8)  Alprazolam 0.5 Mg  Tabs (Alprazolam) .Marland Kitchen.. 1 By Mouth Once Daily As Needed 9)  Viagra 100 Mg  Tabs (Sildenafil Citrate) .Marland Kitchen.. 1 By Mouth Once Daily As Needed 10)  Clonazepam 1 Mg Tabs (Clonazepam) .Marland Kitchen.. 1 By Mouth At Bedtime As Needed 11)  Warfarin Sodium 5 Mg Tabs (Warfarin Sodium) .... Use As Directed Per Anticoagulation  Clinic 12)  Clarinex 5 Mg Tabs (Desloratadine) .Marland Kitchen.. 1 By Mouth Daily 13)  Ambien 10 Mg Tabs (Zolpidem Tartrate) .Marland Kitchen.. 1 Poi At Bedtime 14)  Amlodipine Besylate 5 Mg Tabs (Amlodipine Besylate) .Marland Kitchen.. 1p O Once Daily 15)  Cephalexin 500 Mg Caps (Cephalexin) .Marland Kitchen.. 1 By Mouth Three Times A Day 16)  Hydrocodone-Homatropine 5-1.5 Mg/98ml Syrp (Hydrocodone-Homatropine) .Marland Kitchen.. 1 Tsp By Mouth Q 6 Hrs As Needed 17)  Aricept 10 Mg Tabs (Donepezil Hcl) .... Take 1 Tablet Each Am. 18)  Xyzal 5 Mg Tabs (Levocetirizine Dihydrochloride) .... Take 1 Tablet Each Evening.  Current Medications (verified): 1)  Fluoxetine Hcl 40 Mg  Caps (Fluoxetine Hcl) .... Take 1 By Mouth Once Daily 2)  Multivitamins   Tabs (Multiple Vitamin) .... Take 1 By Mouth Qd 3)  Atenolol 50 Mg  Tabs (Atenolol) .... Take 1 By Mouth Once Daily 4)  Colace 100 Mg  Caps (Docusate Sodium) .... Take 2 By Mouth Qd 5)  Lipitor 40 Mg  Tabs (Atorvastatin Calcium) .... Take 1 By Mouth Once Daily 6)  Celebrex 200 Mg  Caps (Celecoxib) .Marland Kitchen.. 1 By Mouth Two Times A Day As Needed 7)  Cerefolin 5.635-1-50-5 Mg  Tabs (L-Methylfolate-B12-B6-B2) .Marland Kitchen.. 1 By Mouthdaily 8)  Alprazolam 0.5 Mg  Tabs (Alprazolam) .Marland Kitchen.. 1 By Mouth Once Daily As Needed 9)  Viagra 100 Mg  Tabs (Sildenafil Citrate) .Marland Kitchen.. 1 By Mouth Once Daily As Needed 10)  Clonazepam 1 Mg Tabs (Clonazepam) .Marland Kitchen.. 1 By Mouth At Bedtime As Needed 11)  Warfarin Sodium 5 Mg Tabs (Warfarin Sodium) .... Use As Directed Per Anticoagulation Clinic 12)  Clarinex 5 Mg Tabs (Desloratadine) .Marland Kitchen.. 1 By Mouth Daily 13)  Zolpidem Tartrate 5 Mg Tabs (Zolpidem Tartrate) .Marland Kitchen.. 1po At Bedtime As Needed 14)  Amlodipine Besylate 5 Mg Tabs (Amlodipine Besylate) .Marland Kitchen.. 1p O Once Daily 15)  Aricept 10 Mg Tabs (Donepezil Hcl) .... Take 1 Tablet Each Am - Generic 16)  Xyzal 5 Mg Tabs (Levocetirizine Dihydrochloride) .... Take 1 Tablet Each Evening. 17)  Proair Hfa 108 (90 Base) Mcg/act Aers (Albuterol Sulfate) .... 2 Puffs Four Times Per  Day As Needed For Wheezing  Allergies (verified): 1)  Ace Inhibitors  Past History:  Past Medical History: Last updated: 09/29/2009 Colon cancer, hx of (removed w/ colonoscopy 2002, in [place]) Prostate cancer, hx of Colonic polyps, hx of Coronary artery disease(CABG in 1995.   Catheterization on February 17, 2007, demonstrated left main 25%, LAD was   occluded, the circumflex had diffuse luminal irregularities, first   obtuse marginal had 80% ostial stenosis, second obtuse marginal 50% to   60% stenosis, third obtuse marginal 75% proximal and 80% mid stenosis.   The right coronary artery is dominant occluded.  The LIMA to the LAD was   widely patent, vein graft to the PDA was widely patent, vein graft to   third obtuse marginal was widely patent.  The EF 65%  with normal wall   motion) Depression Hyperlipidemia Hypertension Myocardial infarction, hx of Atrial fibrillation Anxiety Thyroid nodule Dementia - mild Allergic rhinitis Lacunar cva Asthma Benign prostatic hypertrophy Cholelithiasis OSA - CPAP RLS  Past Surgical History: Last updated: 09/29/2009 Coronary artery bypass graft x 3 (1996) Prostatectomy (2001) Cardioversion (2007) Stress Cardiolite (02/08/2007) Echocardiography (10/07/2006) Right thyroid lobectomy  Family History: Last updated: 01/01/2008 Family History of CAD Male 1st degree relative - father and sister  before 14 yo Family History Hypertension adpoted - but knew above  Social History: Last updated: 03/04/2010 Never Smoked Alcohol use-no Married 2 children retired - Psychiatric nurse Drug use-no  Risk Factors: Smoking Status: never (09/28/2007)  Family History: Reviewed history from 01/01/2008 and no changes required. Family History of CAD Male 1st degree relative - father and sister  before 50 yo Family History Hypertension adpoted - but knew above  Social History: Reviewed history from 01/01/2008 and no changes required. 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         all otherwise negative per pt -  hx limited due to dementia, wife helps with ROS  Physical Exam  General:  alert and overweight-appearing.   Head:  normocephalic and atraumatic.   Eyes:  vision grossly intact, pupils equal, and pupils round.   Ears:  R ear normal and L ear normal.   Nose:  no external deformity and no nasal discharge.   Mouth:  no gingival abnormalities and pharynx pink and moist.   Neck:  supple.   Lungs:  normal respiratory effort, R decreased breath sounds, and L decreased breath sounds.  and trace  bilat wheeze Heart:  regular rhythm and no murmur.   Abdomen:  soft, non-tender, and normal bowel sounds.   Msk:  no joint tenderness and no joint swelling.   Extremities:  no edema, no erythema  Neurologic:  cranial nerves II-XII intact and strength normal in all extremities.  , pleasantly demented Skin:  color normal and no rashes.   Psych:  not anxious appearing and not depressed appearing.     Impression & Recommendations:  Problem # 1:  Preventive Health Care (ICD-V70.0)  Overall doing well, age appropriate education and counseling updated and referral for appropriate preventive services done unless declined, immunizations up to date or declined, diet counseling done if overweight, urged to quit smoking if smokes , most recent labs reviewed and current ordered if appropriate, ecg reviewed or declined  (interpretation per ECG scanned in the EMR if done); information regarding Medicare Prevention requirements given if appropriate   Orders: First annual wellness visit with prevention plan  (S8546)  Problem # 2:  WHEEZING (ICD-786.07)  mild, ? asthma vs cardiac, for trial proair hfa, and cxr, declines ecg today  - had with dr hochrein/card  Orders: T-2 View CXR, Same Day (71020.5TC) TLB-BNP (B-Natriuretic Peptide) (83880-BNPR) Prescription Created Electronically [phone])  Problem # 3:  HYPERTENSION (ICD-401.9)  His updated medication list for this problem includes:    Atenolol 50 Mg Tabs (Atenolol) .Marland Kitchen... Take 1 by mouth once daily    Amlodipine Besylate 5 Mg Tabs (Amlodipine besylate) .Marland KitchenMarland KitchenMarland KitchenMarland Kitchen  1p o once daily  BP today: 120/62 Prior BP: 136/80 (11/06/2009)  Labs Reviewed: K+: 4.4 (09/23/2009) Creat: : 0.8 (09/23/2009)   Chol: 137 (04/03/2009)   HDL: 39.80 (04/03/2009)   LDL: 58 (01/01/2008)   TG: 220.0 (04/03/2009) stable overall by hx and exam, ok to continue meds/tx as is   Problem # 4:  HYPERLIPIDEMIA (ICD-272.4)  His updated medication list for this problem includes:    Lipitor 40 Mg Tabs (Atorvastatin calcium) .Marland Kitchen... Take 1 by mouth once daily  Orders: TLB-Lipid Panel (80061-LIPID)  Labs Reviewed: SGOT: 32 (09/23/2009)   SGPT: 25 (09/23/2009)   HDL:39.80 (04/03/2009), 32.1 (01/01/2008)  LDL:58 (01/01/2008), DEL (28/41/3244)  Chol:137 (04/03/2009), 118 (01/01/2008)  Trig:220.0 (04/03/2009), 138 (01/01/2008) stable overall by hx and exam, ok to continue meds/tx as is  - Pt to continue diet efforts, good med tolerance; to check labs - goal LDL less than 70  Problem # 5:  DEMENTIA (ICD-294.8) stable overall by hx and exam, ok to continue meds/tx as is , wife and pt declines further med such as namenda;  has no other behavioral or psychosis at this time  Complete Medication List: 1)  Fluoxetine Hcl 40 Mg Caps (Fluoxetine hcl) .... Take 1 by mouth once daily 2)  Multivitamins  Tabs (Multiple vitamin) .... Take 1 by mouth qd 3)  Atenolol 50 Mg Tabs (Atenolol) .... Take 1 by mouth once daily 4)  Colace 100 Mg Caps (Docusate sodium) .... Take 2 by mouth qd 5)  Lipitor 40 Mg Tabs (Atorvastatin calcium) .... Take 1 by mouth once daily 6)  Celebrex 200 Mg Caps (Celecoxib) .Marland Kitchen.. 1 by mouth two times a day as needed 7)  Cerefolin 5.635-1-50-5 Mg Tabs (L-methylfolate-b12-b6-b2) .Marland Kitchen.. 1 by mouthdaily 8)  Alprazolam 0.5 Mg Tabs (Alprazolam) .Marland Kitchen.. 1 by mouth once daily as needed 9)  Viagra 100 Mg Tabs (Sildenafil citrate) .Marland Kitchen.. 1 by mouth once daily as needed 10)  Clonazepam 1 Mg Tabs (Clonazepam) .Marland Kitchen.. 1 by mouth at bedtime as needed 11)  Warfarin Sodium 5 Mg Tabs (Warfarin sodium) .... Use as directed per anticoagulation clinic 12)  Clarinex 5 Mg Tabs (Desloratadine) .Marland Kitchen.. 1 by mouth daily 13)  Zolpidem Tartrate 5 Mg Tabs (Zolpidem tartrate) .Marland Kitchen.. 1po at bedtime as needed 14)  Amlodipine Besylate 5 Mg Tabs (Amlodipine besylate) .Marland Kitchen.. 1p o once daily 15)  Aricept 10 Mg Tabs (Donepezil hcl) .... Take 1 tablet each am - generic 16)  Xyzal 5 Mg Tabs (Levocetirizine dihydrochloride) .... Take 1 tablet each evening. 17)  Proair Hfa 108 (90 Base) Mcg/act Aers (Albuterol sulfate) .... 2 puffs four times per day as needed for wheezing  Other Orders: T-Vitamin D (25-Hydroxy) (01027-25366) TLB-PSA (Prostate Specific Antigen) (84153-PSA) TLB-BMP (Basic Metabolic Panel-BMET) (80048-METABOL) TLB-CBC Platelet - w/Differential (85025-CBCD) TLB-Hepatic/Liver Function Pnl (80076-HEPATIC) TLB-Sedimentation Rate (ESR) (85652-ESR) TLB-TSH (Thyroid Stimulating Hormone) (84443-TSH) TLB-IBC Pnl (Iron/FE;Transferrin) (83550-IBC) TLB-B12 + Folate Pnl (82746_82607-B12/FOL) TLB-Udip ONLY (81003-UDIP)  Patient Instructions: 1)  Please go to Radiology in the basement level for your X-Ray today  2)  Please go to the Lab in the basement for your blood and/or urine tests today  3)  Please take all new  medications as prescribed 4)  Continue all previous medications as before this visit  5)  Please schedule a follow-up appointment in 6 months. Prescriptions: PROAIR HFA 108 (90 BASE) MCG/ACT AERS (ALBUTEROL SULFATE) 2 puffs four times per day as needed for wheezing  #3 x 3   Entered and Authorized by:   Fayrene Fearing  Ellin Mayhew MD   Signed by:   Corwin Levins MD on 03/04/2010   Method used:   Print then Give to Patient   RxID:   [id number] WARFARIN SODIUM 5 MG TABS (WARFARIN SODIUM) Use as directed per anticoagulation clinic  #150 x 3   Entered and Authorized by:   Corwin Levins MD   Signed by:   Corwin Levins MD on 03/04/2010   Method used:   Print then Give to Patient   RxID:   [id number] XYZAL 5 MG TABS (LEVOCETIRIZINE DIHYDROCHLORIDE) Take 1 tablet each evening.  #90 x 3   Entered and Authorized by:   Corwin Levins MD   Signed by:   Corwin Levins MD on 03/04/2010   Method used:   Print then Give to Patient   RxID:   [id number] ARICEPT 10 MG TABS (DONEPEZIL HCL) Take 1 tablet each am - generic  #90 x 3   Entered and Authorized by:   Corwin Levins MD   Signed by:   Corwin Levins MD on 03/04/2010   Method used:   Print then Give to Patient   RxID:   [id number] AMLODIPINE BESYLATE 5 MG TABS (AMLODIPINE BESYLATE) 1p o once daily  #90 x 3   Entered and Authorized by:   Corwin Levins MD   Signed by:   Corwin Levins MD on 03/04/2010   Method used:   Print then Give to Patient   RxID:   [id number] CLONAZEPAM 1 MG TABS (CLONAZEPAM) 1 by mouth at bedtime as needed  #90 x 1   Entered and Authorized by:   Corwin Levins MD   Signed by:   Corwin Levins MD on 03/04/2010   Method used:   Print then Give to Patient   RxID:   [id number] VIAGRA 100 MG  TABS (SILDENAFIL CITRATE) 1 by mouth once daily as needed  #5 x 11   Entered and Authorized by:   Corwin Levins MD   Signed by:   Corwin Levins MD on 03/04/2010   Method used:   Print then Give to Patient   RxID:    [id number] ZOLPIDEM TARTRATE 5 MG TABS (ZOLPIDEM TARTRATE) 1po at bedtime as needed  #90 x 1   Entered and Authorized by:   Corwin Levins MD   Signed by:   Corwin Levins MD on 03/04/2010   Method used:   Print then Give to Patient   RxID:   [id number] ALPRAZOLAM 0.5 MG  TABS (ALPRAZOLAM) 1 by mouth once daily as needed  #90 x 1   Entered and Authorized by:   Corwin Levins MD   Signed by:   Corwin Levins MD on 03/04/2010   Method used:   Print then Give to Patient   RxID:   [id number] CEREFOLIN 5.635-1-50-5 MG  TABS (L-METHYLFOLATE-B12-B6-B2) 1 by mouthdaily  #90 x 3   Entered and Authorized by:   Corwin Levins MD   Signed by:   Corwin Levins MD on 03/04/2010   Method used:   Print then Give to Patient   RxID:   [id number] CELEBREX 200 MG  CAPS (CELECOXIB) 1 by mouth two times a day as needed  #180 x 3   Entered and Authorized by:   Corwin Levins MD   Signed by:   Corwin Levins MD on 03/04/2010   Method used:   Print then Give to Patient  RxID:   [id number] LIPITOR 40 MG  TABS (ATORVASTATIN CALCIUM) take 1 by mouth once daily  #90 x 3   Entered and Authorized by:   Corwin Levins MD   Signed by:   Corwin Levins MD on 03/04/2010   Method used:   Print then Give to Patient   RxID:   [id number] ATENOLOL 50 MG  TABS (ATENOLOL) take 1 by mouth once daily  #90 x 3   Entered and Authorized by:   Corwin Levins MD   Signed by:   Corwin Levins MD on 03/04/2010   Method used:   Print then Give to Patient   RxID:   [id number] FLUOXETINE HCL 40 MG  CAPS (FLUOXETINE HCL) take 1 by mouth once daily  #90 x 3   Entered and Authorized by:   Corwin Levins MD   Signed by:   Corwin Levins MD on 03/04/2010   Method used:   Print then Give to Patient   RxID:   [id number] CLARINEX 5 MG TABS (DESLORATADINE) 1 by mouth daily  #90 x 3   Entered and Authorized by:   Corwin Levins MD   Signed by:   Corwin Levins MD on 03/04/2010   Method used:   Print then Give to Patient    RxID:   [id number]

## 2010-12-10 NOTE — Progress Notes (Signed)
Summary: Rx refill req  Phone Note Refill Request Message from:  Fax from Pharmacy on November 20, 2010 10:33 AM  Refills Requested: Medication #1:  LIPITOR 40 MG  TABS take 1 by mouth once daily   Dosage confirmed as above?Dosage Confirmed   Supply Requested: 6 months [facility]   Method Requested: Fax to [facility] Initial call taken by: Margaret Pyle, CMA,  November 20, 2010 10:33 AM    Prescriptions: LIPITOR 40 MG  TABS (ATORVASTATIN CALCIUM) take 1 by mouth once daily  #90 x 1   Entered by:   Margaret Pyle, CMA   Authorized by:   Corwin Levins MD   Signed by:   Margaret Pyle, CMA on 11/20/2010   Method used:   Faxed to ...       [facility] [address] (mail-order)       [address] [place], [place]  [address]       Ph: [phone]       Fax: [phone]   RxID:   [id number]

## 2010-12-10 NOTE — Progress Notes (Signed)
Summary: Call Report  Phone Note Other Incoming   Caller: [facility] Summary of Call: [facility] Triage Call Report Triage Record Num: [id number] Operator: Migdalia Dk Patient Name: [name] [name] Call Date & Time: 10/27/2010 1:07:19AM Patient Phone: [phone] PCP: Patient Gender: Male PCP Fax : Patient DOB: [birth year] Practice Name: [facility] Reason for Call: Wife states pt. with diarrhea since 10/25/2010. Loose stools x 12 today, small amount of blood. Tonight with oral temp. 102, vomited x 1 tonight. C/o severe weakness, "He is having a hard time getting up." Wife to take pt. to ER for evaluation. Protocol(s) Used: Diarrhea or Other Change in Bowel Habits Recommended Outcome per Protocol: See ED Immediately Reason for Outcome: New onset of diarrhea AND any temperature elevation in an immunocompromised individual or frail elderly Care Advice:  ~ 12/ Initial call taken by: Margaret Pyle, CMA,  October 27, 2010 8:31 AM

## 2010-12-10 NOTE — Medication Information (Signed)
Summary: rov/tm  Anticoagulant Therapy  Managed by: Weston Brass, PharmD PCP: Corwin Levins MD Supervising MD: Ladona Ridgel MD, Sharlot Gowda Indication 1: Atrial Fibrillation (ICD-427.31) Lab Used: [facility] Site: [facility] INR POC 2.4 INR RANGE 2 - 3  Dietary changes: no    Health status changes: no    Bleeding/hemorrhagic complications: no    Recent/future hospitalizations: no    Any changes in medication regimen? yes       Details: Wellbutrin   Recent/future dental: no  Any missed doses?: yes     Details: Missed 1 dose a few weeks ago   Is patient compliant with meds? yes       Allergies: 1)  ! * Lisinopril  Anticoagulation Management History:      The patient is taking warfarin and comes in today for a routine follow up visit.  Positive risk factors for bleeding include an age of 21 years or older.  The bleeding index is 'intermediate risk'.  Positive CHADS2 values include History of HTN.  Negative CHADS2 values include Age > 66 years old.  The start date was 01/05/2007.  His last INR was 1.7 RATIO.  Anticoagulation responsible provider: Ladona Ridgel MD, Sharlot Gowda.  INR POC: 2.4.  Cuvette Lot#: [id number].  Exp: 11/2011.    Anticoagulation Management Assessment/Plan:      The patient's current anticoagulation dose is Warfarin sodium 5 mg tabs: Use as directed per anticoagulation clinic.  The target INR is 2.0-3.0.  The next INR is due 12/24/2010.  Anticoagulation instructions were given to patient.  Results were reviewed/authorized by Weston Brass, PharmD.  He was notified by Stephannie Peters, PharmD Candidate .         Prior Anticoagulation Instructions: INR 2.2 Continue 5mg s daily except 7.5mg s on Mondays, Wednesday and Fridays. Recheck in 4 weeks.   Current Anticoagulation Instructions: INR 2.4   Coumadin 5 mg tablets - Continue 1 tablet every day except 1.5 tablets on Mondays, Wednesdays, Fridays

## 2010-12-22 DIAGNOSIS — I4891 Unspecified atrial fibrillation: Secondary | ICD-10-CM

## 2010-12-22 DIAGNOSIS — Z7901 Long term (current) use of anticoagulants: Secondary | ICD-10-CM

## 2010-12-22 HISTORY — DX: Long term (current) use of anticoagulants: Z79.01

## 2010-12-29 ENCOUNTER — Telehealth: Payer: Self-pay | Admitting: Internal Medicine

## 2010-12-30 ENCOUNTER — Encounter: Payer: Self-pay | Admitting: Internal Medicine

## 2010-12-30 ENCOUNTER — Telehealth: Payer: Self-pay | Admitting: Internal Medicine

## 2010-12-31 ENCOUNTER — Encounter (INDEPENDENT_AMBULATORY_CARE_PROVIDER_SITE_OTHER): Payer: Medicare Other

## 2010-12-31 ENCOUNTER — Encounter: Payer: Self-pay | Admitting: Internal Medicine

## 2010-12-31 DIAGNOSIS — I4891 Unspecified atrial fibrillation: Secondary | ICD-10-CM

## 2010-12-31 DIAGNOSIS — Z7901 Long term (current) use of anticoagulants: Secondary | ICD-10-CM

## 2011-01-05 NOTE — Progress Notes (Signed)
  Phone Note Refill Request   Refills Requested: Medication #1:  BUPROPION HCL 150 MG XR24H-TAB 1 by mouth once daily  Medication #2:  ARICEPT 10 MG TABS Take 1 tablet each am - generic Req refill [facility] and also req refill when complete - Diane [phone]  Initial call taken by: Lamar Sprinkles, CMA,  December 29, 2010 5:28 PM  Follow-up for Phone Call        routines to robin Follow-up by: Corwin Levins MD,  December 29, 2010 5:36 PM  Additional Follow-up for Phone Call Additional follow up Details #1::        sent prescription to requested pharmacy and called pts. wife Diane informed prescriptions have been sent in. Additional Follow-up by: Robin Ewing CMA (AAMA),  December 30, 2010 8:24 AM    Prescriptions: ARICEPT 10 MG TABS (DONEPEZIL HCL) Take 1 tablet each am - generic  #90 x 3   Entered by:   Scharlene Gloss CMA (AAMA)   Authorized by:   Corwin Levins MD   Signed by:   Scharlene Gloss CMA (AAMA) on 12/30/2010   Method used:   Faxed to ...       [facility] [address] (mail-order)       [address] [place], [place]  [address]       Ph: [phone]       Fax: [phone]   RxID:   [id number] BUPROPION HCL 150 MG XR24H-TAB (BUPROPION HCL) 1 by mouth once daily  #90 x 3   Entered by:   Scharlene Gloss CMA (AAMA)   Authorized by:   Corwin Levins MD   Signed by:   Scharlene Gloss CMA (AAMA) on 12/30/2010   Method used:   Faxed to ...       [facility] [address] ([facility]-order)       [address] [place], [place]  [address]       Ph: [phone]       Fax: [phone]   RxID:   [id number]

## 2011-01-05 NOTE — Progress Notes (Signed)
Summary: medication refill  Phone Note Refill Request Message from:  Fax from Pharmacy on December 30, 2010 9:20 AM  Refills Requested: Medication #1:  ALPRAZOLAM 0.5 MG TABS as needed   Dosage confirmed as above?Dosage Confirmed   Notes: [facility], fax#[phone] Initial call taken by: Robin Ewing CMA Duncan Dull),  December 30, 2010 9:20 AM  Follow-up for Phone Call        called pt left msg. to call back Follow-up by: Robin Ewing CMA Duncan Dull),  December 30, 2010 10:21 AM  Additional Follow-up for Phone Call Additional follow up Details #1::        patient will wait for now and not come in. Additional Follow-up by: Robin Ewing CMA Duncan Dull),  December 30, 2010 4:07 PM      already on clonazepam  if feels needs change, needs ROV  (Due anyway)  Corwin Levins MD  December 30, 2010 9:47 AM

## 2011-01-05 NOTE — Medication Information (Signed)
Summary: rov/ewj  Anticoagulant Therapy  Managed by: Weston Brass, PharmD PCP: Corwin Levins MD Supervising MD: Ladona Ridgel MD, Sharlot Gowda Indication 1: Atrial Fibrillation (ICD-427.31) Lab Used: [facility] Site: [facility] INR POC 1.9 INR RANGE 2 - 3  Dietary changes: yes       Details: May have eaten some more greens lately  Health status changes: no    Bleeding/hemorrhagic complications: no    Recent/future hospitalizations: no    Any changes in medication regimen? no    Recent/future dental: no  Any missed doses?: no       Is patient compliant with meds? yes       Allergies: 1)  ! * Lisinopril  Anticoagulation Management History:      The patient is taking warfarin and comes in today for a routine follow up visit.  Positive risk factors for bleeding include an age of 74 years or older.  The bleeding index is 'intermediate risk'.  Positive CHADS2 values include History of HTN.  Negative CHADS2 values include Age > 74 years old.  The start date was 01/05/2007.  His last INR was 1.7 RATIO.  Anticoagulation responsible provider: Ladona Ridgel MD, Sharlot Gowda.  INR POC: 1.9.  Cuvette Lot#: [id number].  Exp: 11/2011.    Anticoagulation Management Assessment/Plan:      The patient's current anticoagulation dose is Warfarin sodium 5 mg tabs: Use as directed per anticoagulation clinic.  The target INR is 2.0-3.0.  The next INR is due 01/28/2011.  Anticoagulation instructions were given to patient.  Results were reviewed/authorized by Weston Brass, PharmD.  He was notified by Margot Chimes PharmD Candidate.         Prior Anticoagulation Instructions: INR 2.4   Coumadin 5 mg tablets - Continue 1 tablet every day except 1.5 tablets on Mondays, Wednesdays, Fridays   Current Anticoagulation Instructions: INR 1.9  Take 1 and 1/2 tablets today and then resume your normal dose fo 1 tablet daily except on Mondays, Wednesdays, and Fridays when you take 1 and 1/2 tablets. Try to eat 1 less serving of greens  per week.   Recheck INR in 4 weeks.

## 2011-01-14 NOTE — Letter (Signed)
Summary: [facility]  [facility]   Imported By: Sherian Rein 01/05/2011 14:00:57  _____________________________________________________________________  External Attachment:    Type:   Image     Comment:   External Document

## 2011-01-18 LAB — COMPREHENSIVE METABOLIC PANEL
Albumin: 3.6 g/dL (ref 3.5–5.2)
BUN: 25 mg/dL — ABNORMAL HIGH (ref 6–23)
Calcium: 8.2 mg/dL — ABNORMAL LOW (ref 8.4–10.5)
Chloride: 108 mEq/L (ref 96–112)
Creatinine, Ser: 0.93 mg/dL (ref 0.4–1.5)
GFR calc non Af Amer: >60 mL/min (ref >60)
Total Bilirubin: 1.4 mg/dL — ABNORMAL HIGH (ref 0.3–1.2)

## 2011-01-18 LAB — URINALYSIS, ROUTINE W REFLEX MICROSCOPIC
Glucose, UA: NEGATIVE mg/dL
Hgb urine dipstick: NEGATIVE
Specific Gravity, Urine: 1.026 (ref 1.005–1.030)

## 2011-01-18 LAB — DIFFERENTIAL
Basophils Absolute: 0 10*3/uL (ref 0.0–0.1)
Lymphocytes Relative: 5 % — ABNORMAL LOW (ref 12–46)
Lymphs Abs: 0.4 10*3/uL — ABNORMAL LOW (ref 0.7–4.0)
Neutro Abs: 6.7 10*3/uL (ref 1.7–7.7)

## 2011-01-18 LAB — LIPASE, BLOOD: Lipase: 20 U/L (ref 11–59)

## 2011-01-18 LAB — CBC
MCH: 31.6 pg (ref 26.0–34.0)
MCHC: 35 g/dL (ref 30.0–36.0)
MCV: 90.3 fL (ref 78.0–100.0)
Platelets: 145 10*3/uL — ABNORMAL LOW (ref 150–400)
RBC: 4.87 MIL/uL (ref 4.22–5.81)

## 2011-01-18 LAB — LACTIC ACID, PLASMA: Lactic Acid, Venous: 1.1 mmol/L (ref 0.5–2.2)

## 2011-01-27 ENCOUNTER — Other Ambulatory Visit: Payer: Self-pay

## 2011-01-27 MED ORDER — CLONAZEPAM 1 MG PO TABS: 1.0000 mg | ORAL_TABLET | Freq: Every day | ORAL | Status: DC

## 2011-01-27 MED ORDER — LEVOCETIRIZINE DIHYDROCHLORIDE 5 MG PO TABS: 5.0000 mg | ORAL_TABLET | Freq: Every evening | ORAL | Status: DC

## 2011-01-27 MED ORDER — AMLODIPINE BESYLATE 5 MG PO TABS: 5.0000 mg | ORAL_TABLET | Freq: Every day | ORAL | Status: DC

## 2011-01-27 NOTE — Telephone Encounter (Signed)
Faxed hardcopy to the patients pharmacy

## 2011-01-27 NOTE — Telephone Encounter (Signed)
Done hardcopy to dahlia/LIM B  

## 2011-01-27 NOTE — Telephone Encounter (Signed)
[facility] refill

## 2011-01-27 NOTE — Telephone Encounter (Signed)
Mail order refill request

## 2011-01-27 NOTE — Progress Notes (Signed)
A user error has taken place: encounter opened in error, closed for administrative reasons.

## 2011-01-28 ENCOUNTER — Ambulatory Visit (INDEPENDENT_AMBULATORY_CARE_PROVIDER_SITE_OTHER): Payer: Medicare Other | Admitting: *Deleted

## 2011-01-28 DIAGNOSIS — I4891 Unspecified atrial fibrillation: Secondary | ICD-10-CM

## 2011-01-28 DIAGNOSIS — Z7901 Long term (current) use of anticoagulants: Secondary | ICD-10-CM

## 2011-01-28 LAB — POCT INR: INR: 2.1

## 2011-01-28 NOTE — Patient Instructions (Signed)
INR = 2.1 Continue current dosage: 1 tablet (5mg ) every day, except for Monday, Wednesday, and Friday take 1.5 tablets (7.5mg ) Recheck INR in 4 weeks

## 2011-02-15 ENCOUNTER — Other Ambulatory Visit: Payer: Self-pay

## 2011-02-15 MED ORDER — CICLESONIDE 50 MCG/ACT NA SUSP: 2.0000 | Freq: Two times a day (BID) | NASAL | Status: DC

## 2011-02-17 ENCOUNTER — Other Ambulatory Visit: Payer: Self-pay

## 2011-02-17 MED ORDER — ATORVASTATIN CALCIUM 40 MG PO TABS: 40.0000 mg | ORAL_TABLET | Freq: Every day | ORAL | Status: DC

## 2011-02-17 MED ORDER — DESLORATADINE 5 MG PO TABS: 5.0000 mg | ORAL_TABLET | Freq: Every day | ORAL | Status: DC

## 2011-02-17 NOTE — Telephone Encounter (Signed)
Called pharmacy and informed of refill information 

## 2011-02-17 NOTE — Telephone Encounter (Signed)
Pharmacy requesting refill on Clarinex 5 mg and lipitor 40 mg.

## 2011-02-24 ENCOUNTER — Ambulatory Visit (INDEPENDENT_AMBULATORY_CARE_PROVIDER_SITE_OTHER): Payer: Medicare Other | Admitting: *Deleted

## 2011-02-24 DIAGNOSIS — I4891 Unspecified atrial fibrillation: Secondary | ICD-10-CM

## 2011-02-24 DIAGNOSIS — Z7901 Long term (current) use of anticoagulants: Secondary | ICD-10-CM

## 2011-02-24 LAB — POCT INR: INR: 2.3

## 2011-02-25 ENCOUNTER — Encounter: Payer: Medicare Other | Admitting: *Deleted

## 2011-03-01 ENCOUNTER — Other Ambulatory Visit: Payer: Self-pay

## 2011-03-01 MED ORDER — ALBUTEROL SULFATE HFA 108 (90 BASE) MCG/ACT IN AERS: 2.0000 | INHALATION_SPRAY | RESPIRATORY_TRACT | Status: DC | PRN

## 2011-03-23 ENCOUNTER — Encounter: Payer: Medicare Other | Admitting: *Deleted

## 2011-03-23 NOTE — Assessment & Plan Note (Signed)
[facility]                            CARDIOLOGY OFFICE NOTE   BASCOM, BIEL                            MRN:          [id number]  DATE:08/13/2008                            DOB:          [birth year]    PRIMARY CARE PHYSICIAN:  Corwin Levins, MD.   REASON FOR PRESENTATION:  The patient with coronary disease.   HISTORY OF PRESENT ILLNESS:  The patient returns for yearly followup.  He has been diagnosed with sleep apnea since I last saw him in restless  legs.  He is wearing CPAP.  From cardiovascular standpoint, he actually  thinks he is doing well.  He is not exercising as much as I would like.  However, with his activities of daily living, he does not get any chest  pressure, neck or arm discomfort.  He will occasionally get some dyspnea  if he does something particularly exerting in the yard.  He is not  having palpitation, presyncope, or syncope.  He is tolerating the meds  as listed.   PAST MEDICAL HISTORY:  Coronary artery disease (CABG in 1995.  Catheterization on February 17, 2007, demonstrated left main 25%, LAD was  occluded, the circumflex had diffuse luminal irregularities, first  obtuse marginal had 80% ostial stenosis, second obtuse marginal 50% to  60% stenosis, third obtuse marginal 75% proximal and 80% mid stenosis.  The right coronary artery is dominant occluded.  The LIMA to the LAD was  widely patent, vein graft to the PDA was widely patent, vein graft to  third obtuse marginal was widely patent.  The EF 65% with normal wall  motion), hypertension, dyslipidemia, prostate cancer, status post seed  implant, colonic polyps removed, previous syncope in [place] worked  up, paroxysmal atrial fibrillation on chronic Coumadin therapy, thyroid  nodule, anxiety/depression, and sleep apnea/ restless legs syndrome   ALLERGIES:  None.   CURRENT MEDICATIONS:  1. Loxitane 40 mg daily.  2. Multivitamin.  3. Atenolol 50 mg daily.  4.  Coumadin.  5. Colace.  6. Celebrex.  7. Clarinex.  8. Lipitor 40 mg daily.  9. Clonazepam.  10.Lisinopril 10 mg daily.   REVIEW OF SYSTEMS:  As stated in HPI and negative for other systems.   PHYSICAL EXAMINATION:  GENERAL:  The patient is in no distress.  VITAL SIGNS:  Blood pressure 132/70, heart rate 53 and regular.  HEENT:  Eyes are unremarkable; pupils equal, round, and reactive to  light; fundi not visualized; oral mucosa unremarkable.  NECK:  No jugular distention at 45 degrees; carotid upstroke brisk and  symmetrical; no bruits and no thyromegaly.  LUNGS:  Clear to auscultation bilaterally.  CHEST:  Well-healed sternotomy scar.  HEART:  PMI not displaced or sustained; S1 and S2 within normal limits;  no S3 and S4; no clicks, no rubs, and no murmurs.  ABDOMEN:  Obese;  positive bowel sounds; normal frequency and pitch; no bruits, no  rebound, no guarding, no midline pulsatile mass, and no organomegaly.  SKIN:  No rashes, nodules.  EXTREMITIES:  A 2+ pulses, no edema.  NEURO:  Grossly intact.   EKG sinus bradycardia, rate 53, axis within normal limits, old inferior  infarct, posterior involvement with early transition of lead V2, no  acute ST-wave changes.   ASSESSMENT AND PLAN:  1. Coronary artery disease.  The patient is doing well with respect to      this.  He was cathed last year.  He has had no new symptoms.  No      further cardiovascular testing is suggested.  He will continue with      risk reduction.  2. Obesity.  We had a long discussion again about weight loss.  We had      long discussion about exercise.  He knows he needs to lose weight      with exercise.  3. Dyslipidemia.  His HDL runs a little bit low, but his LDL is      excellent.  This is followed by Dr. Jonny Ruiz.  I have encouraged him to      do more walking to get his HDL.  4. Hypertension, blood pressure is controlled and he will continue on      meds as listed.  5. Paroxysmal atrial fibrillation.   He has had no symptomatic      paroxysms of this, but given the past history and his [place] score,      he should continue the Coumadin.  6. Followup, I will see back in 1 year or sooner if needed.     Rollene Rotunda, MD, [facility]  Electronically Signed    JH/MedQ  DD: 08/13/2008  DT: 08/14/2008  Job #: [id number]   cc:   Corwin Levins, MD

## 2011-03-23 NOTE — Op Note (Signed)
NAMEJERUSALEM, WERT NO.:  [id number]   MEDICAL RECORD NO.:  [id number]          PATIENT TYPE:  OIB   LOCATION:  5729                         FACILITY:  [facility]   PHYSICIAN:  Velora Heckler, MD      DATE OF BIRTH:  [birth year]   DATE OF PROCEDURE:  04/25/2007  DATE OF DISCHARGE:                               OPERATIVE REPORT   PREOPERATIVE DIAGNOSIS:  Right thyroid nodule.   POSTOPERATIVE DIAGNOSIS:  Right thyroid nodule.   PROCEDURE:  Right thyroid lobectomy.   SURGEON:  Velora Heckler, MD, FACS   ANESTHESIA:  General.   ESTIMATED BLOOD LOSS:  Minimal.   PREPARATION:  Betadine.   COMPLICATIONS:  None.   INDICATIONS:  Patient is a 76 year old white male from [place], [place].  He has a right thyroid nodule that, by ultrasound, measures  3.3 cm in size.  The patient has had prior fine-needle aspiration biopsy  which was benign.  However, the patient continues to have enlargement of  the nodule and now comes to surgery for resection at the request of his  endocrinologist.   DESCRIPTION OF PROCEDURE:  Procedure was done in OR #16 at the [facility].  The patient is brought to the operating room,  placed in supine position on the operating room table.  Following  administration of general anesthesia, the patient is positioned and then  prepped and draped in usual strict aseptic fashion.  After ascertaining  that an adequate level of anesthesia has been obtained, a Kocher  incision is made with a #15 blade.  Dissection is carried through  subcutaneous tissues and platysma.  Hemostasis is obtained with  electrocautery.  Skin flaps are elevated cephalad and caudad from the  thyroid notch to the sternal notch.  A Mahorner self-retaining retractor  is placed for exposure.  Strap muscles are incised in the midline and  the left thyroid lobe is exposed.  Left lobe is small.  There are no  nodular densities.  There is no gross  abnormality.   Next, the right thyroid lobe is exposed.  Strap muscles are reflected  laterally.  Venous tributaries are divided between medium Ligaclips with  the harmonic scalpel.  Superior pole is gently dissected out.  Superior  pole vessels are divided between small and medium Ligaclips using the  harmonic scalpel.  Gland is rolled anteriorly.  Superior and inferior  parathyroid tissue is identified and preserved.  Branches of the  inferior thyroid artery are divided with the Harmonic scalpel.  Gland is  rolled further anteriorly.  Recurrent nerve was identified and  preserved.  Ligament of Allyson Sabal is transected with the electrocautery and  the gland is rolled up and onto the anterior trachea.  Gland is  mobilized across the midline and the isthmus is transected at its  junction with the left thyroid lobe using the Harmonic scalpel for  hemostasis.  Right lobe is submitted to pathology.  Frozen section by  Dr. Berneta Levins confirms follicular lesion in the right thyroid lobe.  She  favors hyperplastic nodule.  Good hemostasis is obtained.  Right  neck is irrigated with warm saline.  Surgicel is placed in the operative  field.  Strap muscles are reapproximated in the midline with interrupted  3-0 Vicryl sutures.  Platysma is closed with interrupted 3-0 Vicryl  sutures.  Skin is closed with a running 4-0 Vicryl subcuticular suture.  Wound is washed and dried and Benzoin and Steri-Strips are applied.  Sterile dressings are applied.  The patient is awakened from anesthesia  and brought to the recovery room in stable condition.  The patient  tolerated the procedure well.      Velora Heckler, MD  Electronically Signed     TMG/MEDQ  D:  04/25/2007  T:  04/25/2007  Job:  [id number]   cc:   Corwin Levins, MD  Cleophas Dunker Everardo All, MD  Rollene Rotunda, MD, [facility]

## 2011-03-23 NOTE — Assessment & Plan Note (Signed)
[facility]                            CARDIOLOGY OFFICE NOTE   [name] [name], [name] [name]                            MRN:          [id number]  DATE:07/27/2007                            DOB:          [birth year]    PRIMARY:  Dr. Oliver Barre.   REASON FOR PRESENTATION:  Evaluate patient with coronary disease.   HISTORY OF PRESENT ILLNESS:  The patient presents for followup.  He has  done well since I last saw him.  He did have his thyroid nodule removed  and there is no malignant pathology.  He has had no new symptoms.  He  denies any chest discomfort, neck or arm discomfort.  He has had no  palpitations, presyncope or syncope.  He has had no PND or orthopnea.   PAST MEDICAL HISTORY:  1. Coronary artery disease (Coronary artery bypass grafting in 1995.      Catheterization, February 17, 2007, demonstrated left main 25%      stenosis, left anterior descending occluded, circumflex diffuse,      luminal irregularities, 1st obtuse marginal 80% ostial stenosis,      2nd obtuse marginal 50% to 60% stenosis, 3rd obtuse marginal 75%      proximal and 80% mid stenosis.  The right coronary artery is      dominant and occluded.  The left internal mammary artery to the      left anterior descending was widely patent, vein graft to the      posterior descending artery was widely patent, vein graft to the      3rd obtuse marginal was widely patent.  The ejection fraction 65%      with normal wall motion.)  2. Hypertension.  3. Hyperlipidemia.  4. Prostate cancer, status post seed implantation.  5. Colonic polyps removed.  6. Previous syncope in [place], worked up.  7. Paroxysmal atrial fibrillation, on chronic Coumadin therapy.  8. Thyroid nodule.  9. Anxiety/depression.   ALLERGIES:  None.   MEDICATIONS:  1. Fluoxetine 40 mg daily.  2. Multivitamin.  3. Atenolol 50 mg daily.  4. Coumadin.  5. Colace.  6. Celebrex.  7. Clarinex.  8. Lipitor 40 mg daily.   REVIEW OF SYSTEMS:  As stated in the HPI and otherwise negative for  other systems.   PHYSICAL EXAMINATION:  The patient is in no distress.  Blood pressure 150/80, heart rate 58 and regular, weight 206 pounds,  body mass index 28.  HEENT:  Eyelids unremarkable, pupils are equal, round, and reactive to  light, fundi not visualized.  Oral mucosa unremarkable.  NECK:  No jugular venous distension at 45 degrees, carotid upstroke  brisk and symmetrical.  No bruit.  No thyromegaly.  LYMPHATICS:  No adenopathy.  LUNGS:  Clear to auscultation bilaterally.  BACK:  No costovertebral angle tenderness.  CHEST:  Well-healed sternotomy scar.  HEART:  PMI not displaced or sustained.  S1 and S2 are within normal  limits.  No S3, no S4.  No clicks, no rubs, no murmurs.  ABDOMEN:  Obese, positive bowel sounds, normal in  frequency and pitch.  No bruits, no rebound, no guarding.  No midline pulsatile mass.  No  organomegaly.  SKIN:  No rashes, no nodules.  EXTREMITIES:  2+ pulses throughout, no edema.  NEURO:  Grossly intact.   EKG sinus bradycardia, rate 55, axis within normal limits, intervals  within normal limits, no acute ST-T wave changes.   ASSESSMENT AND PLAN:  1. Coronary disease:  The patient needs aggressive risk reduction.  No      further cardiovascular testing is planned at this point.  2. Obesity:  We had a long discussion about his weight.  He is not yet      at the obese range.  He does have abdominal obesity, however, with      waist circumference of 40.  He understands the risk of this and the      need to reduce this.  3. Hypertension:  Blood pressure is slightly elevated, but it has not      been usually.  I have asked him to keep a blood pressure diary with      a goal of less than 140/90.  4. Dyslipidemia:  Per Dr. Jonny Ruiz.  The goal would be an LDL less than      100 and HDL greater than 40.  5. Followup:  He can come back to this clinic in 1 year or sooner if       needed.     Rollene Rotunda, MD, [facility]  Electronically Signed    JH/MedQ  DD: 07/27/2007  DT: 07/27/2007  Job #: [id number]   cc:   Corwin Levins, MD

## 2011-03-25 ENCOUNTER — Ambulatory Visit: Payer: Self-pay | Admitting: Internal Medicine

## 2011-03-26 NOTE — Assessment & Plan Note (Signed)
[facility]                            CARDIOLOGY OFFICE NOTE   [name] [name], [name] [name]                            MRN:          [id number]  DATE:02/13/2007                            DOB:          11-Oct-1935    PRIMARY CARE PHYSICIAN:  Corwin Levins, MD   PRIMARY CARDIOLOGIST:  Rollene Rotunda, MD, [facility]   HISTORY OF PRESENT ILLNESS:  Mr.  [name] [name] is a 76 year old male patient  with a history of coronary disease status post CABG in 1995 as well as  paroxysmal atrial fibrillation on Coumadin therapy who presents to the  office today for follow up on recent stress perfusion study.  Dr.  Antoine Poche saw him as a new patient on January 18, 2007.  Please see that  note for complete details.  He was complaining of some exertional  dyspnea.  Therefore, he was set up for Adenosine Myoview.  This showed  very mild distal anterior ischemia.  Of note, the patient does have  thyroid nodule and is being considered for thyroid surgery.  He does  note that he occasionally gets some exertional chest heaviness and  tightness associated with shortness of breath.  There is no radiation.  There is no syncope.  He denies orthopnea or paroxysmal nocturnal  dyspnea.  Denies lower extremity edema.   CURRENT MEDICATIONS:  1. Fluoxetine 40 mg daily.  2. Multivitamin daily.  3. Atenolol 50 mg daily.  4. Warfarin as directed.  5. Vytorin 10/20 mg.  6. Colace 400 mg daily.   For past medical history, family history, social history, please see Dr.  Jenene Slicker note from January 18, 2007.   REVIEW OF SYSTEMS:  Please see HPI.  Denies any fevers, chills, cough.  Does have recent flu symptoms.  This has resolved.  Denies any melena,  hematochezia, dysuria.  Review of systems are negative.   PHYSICAL EXAMINATION:  GENERAL:  He is a well-developed, well-nourished  male.  VITAL SIGNS:  Blood pressure 160/86, pulse 72, weight 206 pounds.  HEENT:  Unremarkable.  NECK:  Without JVD.  HEART:   Regular S1, S2.  Regular rate and rhythm.  LUNGS:  Clear to auscultation bilaterally without wheezes, rales or  rhonchi.  ABDOMEN:  Soft, nontender.  EXTREMITIES:  Without edema.  Calves are soft and nontender.  SKIN:  Warm and dry.  NEUROLOGICAL:  Alert and oriented x3.  Cranial nerves II-XII are grossly  intact.   IMPRESSION:  1. Coronary artery disease status post CABG in 1985.  Grafts:  LIMA to      the LAD, vein graft to the OM, vein graft to the PDA.  2. Hypertension, uncontrolled.  3. Hyperlipidemia, treated.  4. Prostate cancer status post seed implantation.  5. Status post colon polyp removal.  6. History of unexplained syncope with complete workup in [place].  7. Good LV function.  8. History of apparent paroxysmal atrial fibrillation on chronic      Coumadin therapy.  9. Chest pain and shortness of breath concerning for exertional      angina.  Recent Adenosine  Myoview significant for very mild distal      anterior ischemia.  EF 57%.  10.Thyroid nodule   PLAN:  The patient was also seen and examined by Dr. Antoine Poche.  He does  have a mildly abnormal nuclear scan.  He does have some symptoms that  sound consistent with exertional angina.  His bypass grafts are 73 years  old.  At this point in time, we have recommended proceeding with cardiac  catheterization.  Risks and benefits were explained to the patient and  he agrees to proceed.  Dr. Antoine Poche also saw the patient.  We have asked  him to go ahead and start holding his Coumadin today.  We will set him  up for cardiac catheterization later this week in the outpatient lab  with Dr. Antoine Poche.  He will likely need further adjustments in his blood  pressure medications to better control his blood pressure.  I have asked  him to start on a baby aspirin a day while he is off of his Coumadin.  We will make further recommendations regarding clearing him for surgery  after the results of his cardiac catheterization are  known.      Tereso Newcomer, PA-C  Electronically Signed      Rollene Rotunda, MD, [facility]  Electronically Signed   SW/MedQ  DD: 02/13/2007  DT: 02/14/2007  Job #: [id number]

## 2011-03-26 NOTE — Consult Note (Signed)
[facility]                          ENDOCRINOLOGY CONSULTATION   DELLIS, VOGHT                            MRN:          [id number]  DATE:01/04/2007                            DOB:          [birth year]    REFERRING PHYSICIAN:  Corwin Levins, MD   REASON FOR REFERRAL:  Check thyroid.   HISTORY OF PRESENT ILLNESS:  A 76 year old man who was incidentally  noted on a CT scan to have a large right-sided thyroid nodule.  He had a  biopsy of this while  he lived in [place], and states it was benign.  He feels well except for some weight gain.   PAST MEDICAL HISTORY:  1. Anxiety/depression.  2. Dyslipidemia.  3. Apparent history of atrial fibrillation.   SOCIAL HISTORY:  He is retired, he is married.   FAMILY HISTORY:  He is adopted.   REVIEW OF SYSTEMS:  Denies difficulty swallowing or breathing.   PHYSICAL EXAMINATION:  VITAL SIGNS:  Blood pressure 137/72, heart rate  57, temperature 97.1, the weight is 203.  GENERAL:  No distress.  NECK:  I feel maybe a small amount of fullness at the right lower pole,  but the nodule must be subclavicular.   LABORATORY STUDIES:  Thyroid function studies recently done by Dr. Jonny Ruiz  are normal.  Thyroid ultrasound on April 14, 2006, shows a 2.6 x 2.0 x 2.5  solid nodule in the right lower pole.   IMPRESSION:  1. Thyroid nodule, apparently benign on biopsy.  2. Other complex medical history as noted above.  3. In view of his being euthyroid, his weight gain should be deemed      not thyroid related.   PLAN:  1. I have asked him to obtain the pathology report from his biopsy      last year.  2. Recheck ultrasound for comparison.  3. If the ultrasound is unchanged, have advised him to return in a      year, or sooner if necessary.     Sean A. Everardo All, MD  Electronically Signed    SAE/MedQ  DD: 01/05/2007  DT: 01/05/2007  Job #: [id number]   cc:   Corwin Levins, MD

## 2011-03-26 NOTE — Assessment & Plan Note (Signed)
[facility]                            CARDIOLOGY OFFICE NOTE   TYRIN, HERBERS                            MRN:          [id number]  DATE:01/18/2007                            DOB:          10/19/1935    PRIMARY CARE PHYSICIAN:  Dr. Oliver Barre.   REASON FOR PRESENTATION:  Evaluate patient with coronary disease.   HISTORY OF PRESENT ILLNESS:  The patient is a 76 year old gentleman who  moved here from [place].  He had bypass surgery in 1995. He had  acute myocardial infarction.  The anatomy is described below.  Since  that time, he has had stress perfusion studies and echocardiograms.  Most recently, while in [place], he had workup of episodes of  syncope.  He apparently had several of these, starting last February.  The last of these was in November.  He describes episodes of  unresponsiveness.  His male friend describes his body going rigid  and his eyes rolling back in his head.  These apparently last for 3-4  minutes.  He said he has fallen enough to have trauma, and apparently,  has been in the emergency room.  He has had an extensive workup per his  report.  They describe negative EEGs and a negative neurological workup,  including other imaging.  He has had echocardiogram, the most recent  being in November 2007.  This demonstrated the EF to be 55%.  Left  atrium was moderately dilated.  There were no significant other  abnormalities.  There was mild mitral regurgitation.  Carotid studies  demonstrate some mild plaque.  He had a Holter monitor in November that  demonstrated PVCs, PACs, but no sustained arrhythmia.  He did not have  stress perfusion study that I can see.  The last one was in October of  2006.  At that time, he did not have any ischemia or infarct.  His EF  was 61%.   He is now being considered for a thyroid surgery as he has a single  solid nodule in his thyroid.  He also needs to be established with a new  cardiologist.  He does not get around very much, and has not been  active.  He does not have any specific limitations other than he is  somewhat weak from inactivity.  He does not have any chest discomfort,  neck discomfort, or arm discomfort.  He does not have any palpitations,  syncope, or presyncope.  He describes shortness of breath with moderate  activity, but relates this to weight gain he has had over the year,  being inactive.  He does not have PND or orthopnea.   PAST MEDICAL HISTORY:  1. Hypertension.  2. Hyperlipidemia x10 years.  3. Prostate cancer.  4. Colon cancer.  5. Anxiety/depression.   PAST SURGICAL HISTORY:  1. CABG (LIMA to the LAD, SVG to OM, SVG to PDA, 1995).  2. Prostate cancer seed implants.  3. Colonic polyps removed.   ALLERGIES:  None.   MEDICATIONS:  1. Fluoxetine 40 mg daily.  2. Centrum Silver.  3.  Atenolol 50 mg daily.  4. Coumadin.  5. Vytorin 10/20 daily.  6. Alprazolam.  7. Colace.  8. Ambien.  9. Tylenol.  10.Aspirin 81 mg daily.   SOCIAL HISTORY:  The patient is retired.  He worked in [employer], dispensing.  He  is with his male partner.  He has 2 children of his own.  He never  smoked cigarettes and does not drink alcohol.   FAMILY HISTORY:  Vague.  He is adopted.  He has a sister who a  myocardial infarction, and a father with myocardial infarction, in their  48s.   REVIEW OF SYSTEMS:  Positive for weakness and fatigue that improved  since Atenolol was reduced, ringing in his ears, occasional wheezing,  occasional cough, constipation, reflux, arthritis.  Negative for other  systems.   PHYSICAL EXAMINATION:  The patient is in no distress.  Blood pressure 148/82, heart rate 52 and regular, weight 204 pounds,  body mass index 28.  HEENT:  Eyes unremarkable, pupils are equal, round, and reactive to  light, fundi not visualized.  Oral mucosa unremarkable.  NECK:  No jugular venous distension, wave form within normal limits,  carotid  upstroke brisk and symmetrical.  No bruit.  No thyromegaly.  LYMPHATICS:  No cervical, axillary, inguinal adenopathy.  LUNGS:  Clear to auscultation bilaterally.  BACK:  No costovertebral angle tenderness.  CHEST:  Well-healed sternotomy scar.  HEART:  PMI not displaced or sustained.  S1 and S2 are within normal  limits.  No S3, no S4.  No clicks, no rubs, no murmurs.  ABDOMEN:  Obese, positive bowel sounds, normal in frequency and pitch.  No bruits, no rebound, no guarding.  No midline pulsatile mass.  No  hepatomegaly, no splenomegaly.  SKIN:  No rashes, no nodules.  EXTREMITIES:  2+ pulses throughout, trace edema.  No cyanosis, no  clubbing.  NEURO:  Oriented to person, place, and time.  Cranial nerves II-XII  grossly intact, motor grossly intact.   EKG sinus bradycardia, rate 52, axis within normal limits, intervals  within normal limits, probable old inferior infarct, no acute ST-T wave  changes.   ASSESSMENT/PLAN:  1. Coronary disease.  Patient is not having any chest pain.  However,      he has dyspnea.  He has 74 year old bypass grafts.  Given this, I      am going to screen him with an adenosine perfusion study.  His leg      weakness would preclude him from walking on a treadmill.  Following      this, hopefully, he will be able to get into an exercise regimen,      and we will work on this with him.  2. Dyslipidemia.  His triglycerides were elevated and his HDL low.      His LDL was at target.  I have encouraged diet and exercise and      discussed the [facility] approach to try to reverse his      triglyceride HDL.  3. Preoperative clearance.  The patient would be okay for surgery      based on the results of the perfusion study.  4. Hypertension.  Blood pressure slightly elevated.  However,      hopefully, with a few pounds of weight loss, he will come down to      his target. 5. Follow up.  We will see him back in 3 months or sooner if needed.     Rollene Rotunda, MD, [facility]  Electronically Signed    JH/MedQ  DD: 01/18/2007  DT: 01/20/2007  Job #: [id number]   cc:   Corwin Levins, MD  Cleophas Dunker. Everardo All, MD

## 2011-03-26 NOTE — Cardiovascular Report (Signed)
[name] [name], [name] [name] NO.:  [id number]   MEDICAL RECORD NO.:  [id number]          PATIENT TYPE:  OIB   LOCATION:  1962                         FACILITY:  [facility]   PHYSICIAN:  Rollene Rotunda, MD, FACCDATE OF BIRTH:  [birth year]   DATE OF PROCEDURE:  02/17/2007  DATE OF DISCHARGE:                            CARDIAC CATHETERIZATION   PROCEDURE:  Cardiac catheterization.   INDICATIONS:  Evaluate patient with chest pain, shortness of breath, and  a Cardiolite suggesting mild distal anterior ischemia.   PROCEDURE NOTE:  Left heart catheterization performed via the right  femoral artery.  The artery was cannulated using anterior wall puncture.  A #6-French arterial sheath was inserted via the modified Seldinger  technique.  A preformed Judkins and pigtail catheter were utilized.  The  patient tolerated the procedure well and left the lab in stable  condition.   RESULTS:   HEMODYNAMICS:  LV 147/15, AO 145/71.   CORONARIES:  The left main had distal 25% stenosis.  The LAD had mid  100% occlusion.  The distal LAD was small and filled via the LIMA.  The  circumflex had diffuse luminal irregularities in the AV groove.  OM-1  was small ostial 80% stenosis.  OM-2 was moderate-sized with diffuse 50-  60% stenosis.  An OM-3 was large with proximal long 75 and mid 80%  stenosis.  The right coronary artery is dominant and occluded  proximally.  It filled via the vein graft.   GRAFTS:  The LIMA to the LAD was widely patent.  The saphenous vein to  the PDA was widely patent.  Saphenous vein graft to OM-3 was widely  patent.   LEFT VENTRICULOGRAM:  The left ventriculogram was obtained in the RAO  projection.  The EF was 65% with normal wall motion.   CONCLUSION:  1. Severe three-vessel coronary artery disease.  2. Patent grafts.  3. Preserved left ventricular ejection fraction.   PLAN:  The patient will continue to have aggressive medical management  for secondary risk  reduction.  No further cardiovascular testing is  planned.      Rollene Rotunda, MD, [facility]  Electronically Signed     JH/MEDQ  D:  02/17/2007  T:  02/17/2007  Job:  [id number]   cc:   Corwin Levins, MD

## 2011-03-26 NOTE — Assessment & Plan Note (Signed)
[facility]                            CARDIOLOGY OFFICE NOTE   [name] [name], [name] [name]                            MRN:          [id number]  DATE:02/24/2007                            DOB:          Aug 17, 1935    PRIMARY CARE PHYSICIAN:  Corwin Levins, MD   PRIMARY CARDIOLOGIST:  Rollene Rotunda, MD, [facility]   PATIENT PROFILE:  A 76 year old married Caucasian male with prior  history of CAD status post CABG who recently underwent cardiac  catheterization, and presents for followup.   PROBLEM LIST:  1. Coronary artery disease.      a.     Status post coronary artery bypass graft x3 in 1995 with a       LIMA to the left anterior descending, main graft to the posterior       descending artery, and vein graft to the 3rd obtuse marginal.      b.     February 08, 2007 adenosine Myoview:  Ejection fraction 57%, and       no ST segment changes suggestive of ischemia.  Very mild distal       anterior ischemia.  Felt to be a low-risk adenosine nuclear study.      c.     February 17, 2007:  Cardiac catheterization.  Left main 25%       distal.  Left anterior descending 100%.  Left circumflex had       diffuse luminal irregularities.  First obtuse marginal 80% ostial.       Second obtuse marginal had 56% stenosis.  Third obtuse marginal       75% proximal, and 80% mid.  Right coronary artery dominant and       occluded proximally.  LIMA to the left anterior descending widely       patent.  Vein graft to the posterior descending artery widely       patent.  Vein graft to the 3rd obtuse marginal widely patent.       Ejection fraction 65% with normal wall motion.  2. Hypertension.  3. Hyperlipidemia.  4. History of prostate cancer, status post seed implantation.  5. Status post colon polyp removal.  6. History of unexplained syncope with complete workup at [place].  7. History of paroxysmal atrial fibrillation, on chronic Coumadin      therapy.  8. Recently diagnosed  thyroid nodule, pending surgery.  9. Anxiety and depression.   HISTORY OF PRESENT ILLNESS:  A 76 year old married Caucasian male with  the above problem list, who was recently seen by Dr. Antoine Poche for  establishment of local Cardiology care.  Patient is pending thyroid  surgery for a benign nodule, and required cardiac evaluation  preoperatively.  An adenosine Myoview was performed, and showed a small  amount of ischemia as outlined in the problem list.  The patient,  therefore, underwent left heart cardiac catheterization on February 17, 2007, which revealed native multivessel disease with patent grafts x3.  Since his catheterization, the patient has done well without any  recurrent chest pain or shortness  of breath.  He continues to await  surgical evaluation for his thyroid nodule.   HOME MEDICATIONS:  1. Fluoxetine 40 mg daily.  2. Multivitamin daily.  3. Atenolol 50 mg daily.  4. Coumadin as directed.  5. Vytorin 10/20 mg daily.  6. Colace 400 mg daily.   PHYSICAL EXAMINATION:  Blood pressure 128/76.  Pulse 53.  Respirations  18.  His weight is 204 pounds.  A pleasant white male, in no acute distress.  Awake, alert, and oriented  x3.  NECK:  No bruits or JVD.  LUNGS:  Respirations regular and unlabored.  Clear to auscultation.  CARDIAC:  Regular S1 and S2.  No S3, S4, or murmurs.  ABDOMEN:  Obese, soft, non-tender, and non-distended.  Bowel sounds  present.  EXTREMITIES:  Warm, dry, and pink.  No clubbing, cyanosis, or edema.  Dorsalis pedis and posterior tibial pulses 2+ and equal bilaterally.  The right groin site, which was used for catheterization, is clear of  bleeding, bruits, or hematoma.   ACCESSORY CLINICAL FINDINGS:  None.   ASSESSMENT AND PLAN:  1. Coronary artery disease.  Patient is recently status post      catheterization, which revealed patent grafts in native multivessel      disease.  He is asymptomatic, and remains on beta blocker, and      statin  therapy.  We will make no change to his current regimen.      From a cardiac standpoint, the patient is felt to be low risk for      thyroid surgery.  2. Thyroid nodule.  Followed by Dr. Jonny Ruiz.  Patient is pending surgical      evaluation.  As above, patient is felt to be low risk from a      cardiac standpoint for surgery.  Patient will need to come off      Coumadin preoperatively.  3. Hypertension.  Stable.  4. Hyperlipidemia.  Patient remains on Vytorin therapy.  5. History of paroxysmal atrial fibrillation.  Patient remains on      chronic Coumadin therapy.  He is in sinus rhythm.  His Coumadin      will be stopped preoperatively, and he should not require bridging      with either heparin or Lovenox.  6. Anxiety and depression.  Per Dr. Jonny Ruiz.  7. Disposition.  Patient will follow up with Dr. Antoine Poche in 6 months,      or sooner if necessary.     Nicolasa Ducking, ANP  Electronically Signed      Jesse Sans. Daleen Squibb, MD, [facility]  Electronically Signed   CB/MedQ  DD: 02/24/2007  DT: 02/25/2007  Job #: [id number]

## 2011-03-26 NOTE — Assessment & Plan Note (Signed)
[facility]                         GASTROENTEROLOGY OFFICE NOTE   NAME:[name], [name]                            MRN:          [id number]  DATE:02/06/2007                            DOB:          03/30/1935    REASON FOR CONSULTATION:  History of colon polyps, colon cancer.   ASSESSMENT:  A 76 year old white man that had a cancerous colon polyps  removed (in [place]) in 2002. A followup sigmoidoscopy after that  was okay and he has not had a surveillance examination.   He also has a history of prostate cancer treated with seed implants.  There is some occasional bright red blood per rectum associated with  hard stools only, which is not a change in his history. He has coronary  artery disease with a history of coronary artery bypass grafting in 1995  and he also has an enlarging right thyroid nodule that should be removed  surgically. He has a pending nuclear stress test.   RECOMMENDATIONS AND PLAN:  1. He should have a surveillance colonoscopy. However, given his      pending stress test and pending possible surgery for an enlarging      thyroid lesion, I would defer that for the time being.  2. I have instructed the patient to call me back within a couple of      months to set this up. He would need to come off of Coumadin for a      few days before. We would need Dr. Antoine [name] to approve that. His      stress test could have bearing upon that as well. I think he is on      Coumadin for a history of atrial fibrillation.  3. Should he need thyroid surgery and it be several months prior to      having that, I would go ahead and do the colonoscopy in the      interim.   HISTORY:  A 76 year old white man that has a history as above. I do not  have his colonoscopy report, but I see a letter from his previous  gastroenterologist who found this adenomatous and carcinomatous polyp  with villous features. It was pedunculated and it was removed. The  colonoscopist thought that it was completely removed though stalk  invasion could not be ruled out. He was followed up with a sigmoidoscopy  about six weeks after that and that was okay. He has not had further  colonoscopy surveillance. He is in the process of moving here from  [place]. There is occasional constipation when he strains to stool.  With a very hard stool, he will see a scanty amount of bright red blood  either on the outside of the stool or on tissue paper. That is a rare,  intermittent phenomena. He has occasional heartburn with spicy foods,  treated successfully with antacids. This is once a week or so at most.  The constipation is occasional. His GI review of systems is otherwise  unrevealing and negative.   MEDICATIONS:  1. Warfarin on a daily basis.  2.  Fluoxetine 40 mg daily.  3. Multivitamin daily.  4. Atenolol 50 mg daily.  5. Vytorin 10/20 mg daily.  6. Colace daily.  7. Xanax p.r.n.  8. Ambien p.r.n.  9. Tylenol arthritis p.r.n.   DRUG ALLERGIES:  None known.   PAST MEDICAL HISTORY:  1. Hypertension.  2. Dyslipidemia.  3. Prostate cancer with seed implants.  4. Colon cancer as above.  5. Anxiety and depression.  6. Coronary artery bypass graft in 1995.  7. Some history of recent syncope last year.  8. Enlarging thyroid mass. Biopsies in [place] have been negative,      though there is a 35% enlargement over time so Dr. Everardo [name] has      recommended surgical removal.  9. Depression.  10.Osteoarthritis.   FAMILY HISTORY:  Review of records indicates an elderly parent may have  had colon cancer above the age of 72.   SOCIAL HISTORY:  He is married and lives with his spouse, one son and  one daughter. He was previously in radio and TV service. No alcohol,  tobacco or drug use.   REVIEW OF SYSTEMS:  See my medical history form. There is a slight  wheeze when he breathes. Remainder of the review of the systems is  documented in my history  form.   Note, other records indicate the patient is adopted.   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   PHYSICAL EXAMINATION:  Reveals a well-developed, elderly white man,  obese, in no acute distress, looking younger than his stated age. Height  is 5 feet, 11 inches. Weight is 204 pounds. Blood pressure 132/72. Pulse  64.  EYES: Anicteric.  NECK: Is a slight fullness in the right lobe of the thyroid.  LUNGS: Clear.  HEART: S1, S2. No murmurs, rubs or gallops.  ABDOMEN: Is obese, soft. There is no organomegaly or mass otherwise.  Abdomen is nontender.  RECTAL: Examination is deferred.  LYMPHATIC: No neck or supraclavicular nodes palpated.  SKIN: Warm and dry. No acute rash.  PSYCH: He appears alert and oriented x3.  External lips, nose, ears look normal.   I appreciate the opportunity to care for this patient. I have reviewed  the cardiology records, Dr. George [name] note and Dr. Raphael [name] notes as well  as the records from [place] regarding his previous colon cancer.     [name] Boop, MD,FACG  Electronically Signed    CEG/MedQ  DD: 02/06/2007  DT: 02/06/2007  Job #: [id number]   cc:   [name] Levins, MD

## 2011-03-30 ENCOUNTER — Encounter: Payer: Self-pay | Admitting: Internal Medicine

## 2011-03-30 DIAGNOSIS — Z0000 Encounter for general adult medical examination without abnormal findings: Secondary | ICD-10-CM | POA: Insufficient documentation

## 2011-04-01 ENCOUNTER — Encounter: Payer: Self-pay | Admitting: Internal Medicine

## 2011-04-01 ENCOUNTER — Ambulatory Visit (INDEPENDENT_AMBULATORY_CARE_PROVIDER_SITE_OTHER): Payer: Medicare Other | Admitting: Internal Medicine

## 2011-04-01 ENCOUNTER — Ambulatory Visit (INDEPENDENT_AMBULATORY_CARE_PROVIDER_SITE_OTHER): Payer: Medicare Other | Admitting: *Deleted

## 2011-04-01 VITALS — BP 122/70 | HR 60 | Temp 99.0°F | Ht 69.0 in | Wt 222.2 lb

## 2011-04-01 DIAGNOSIS — E785 Hyperlipidemia, unspecified: Secondary | ICD-10-CM

## 2011-04-01 DIAGNOSIS — I10 Essential (primary) hypertension: Secondary | ICD-10-CM

## 2011-04-01 DIAGNOSIS — Z23 Encounter for immunization: Secondary | ICD-10-CM

## 2011-04-01 DIAGNOSIS — F329 Major depressive disorder, single episode, unspecified: Secondary | ICD-10-CM

## 2011-04-01 DIAGNOSIS — I4891 Unspecified atrial fibrillation: Secondary | ICD-10-CM

## 2011-04-01 DIAGNOSIS — Z0000 Encounter for general adult medical examination without abnormal findings: Secondary | ICD-10-CM

## 2011-04-01 DIAGNOSIS — Z7901 Long term (current) use of anticoagulants: Secondary | ICD-10-CM

## 2011-04-01 DIAGNOSIS — F068 Other specified mental disorders due to known physiological condition: Secondary | ICD-10-CM

## 2011-04-01 LAB — POCT INR: INR: 2.1

## 2011-04-01 MED ORDER — ALBUTEROL SULFATE HFA 108 (90 BASE) MCG/ACT IN AERS: 2.0000 | INHALATION_SPRAY | RESPIRATORY_TRACT | Status: DC | PRN

## 2011-04-01 MED ORDER — OLOPATADINE HCL 0.2 % OP SOLN: 1.0000 [drp] | Freq: Two times a day (BID) | OPHTHALMIC | Status: DC

## 2011-04-01 MED ORDER — CELECOXIB 200 MG PO CAPS: 200.0000 mg | ORAL_CAPSULE | Freq: Two times a day (BID) | ORAL | Status: DC

## 2011-04-01 MED ORDER — FLUOXETINE HCL 40 MG PO CAPS: 40.0000 mg | ORAL_CAPSULE | Freq: Every day | ORAL | Status: DC

## 2011-04-01 MED ORDER — L-METHYLFOLATE-B12-B6-B2 6-1-50-5 MG PO TABS: 1.0000 | ORAL_TABLET | Freq: Every day | ORAL | Status: DC

## 2011-04-01 MED ORDER — CLONAZEPAM 1 MG PO TABS: 1.0000 mg | ORAL_TABLET | Freq: Every evening | ORAL | Status: DC | PRN

## 2011-04-01 MED ORDER — WARFARIN SODIUM 5 MG PO TABS: 5.0000 mg | ORAL_TABLET | ORAL | Status: DC

## 2011-04-01 MED ORDER — PNEUMOCOCCAL VAC POLYVALENT 25 MCG/0.5ML IJ INJ
0.5000 mL | INJECTION | Freq: Once | INTRAMUSCULAR | Status: AC
Start: 2011-04-01 — End: 2011-04-01
  Administered 2011-04-01: 0.5 mL via INTRAMUSCULAR

## 2011-04-01 MED ORDER — CICLESONIDE 50 MCG/ACT NA SUSP: 2.0000 | Freq: Two times a day (BID) | NASAL | Status: DC

## 2011-04-01 MED ORDER — ALPRAZOLAM 0.5 MG PO TABS: 0.5000 mg | ORAL_TABLET | Freq: Every day | ORAL | Status: DC | PRN

## 2011-04-01 MED ORDER — AMLODIPINE BESYLATE 5 MG PO TABS: 5.0000 mg | ORAL_TABLET | Freq: Every day | ORAL | Status: DC

## 2011-04-01 MED ORDER — ATENOLOL 50 MG PO TABS: 50.0000 mg | ORAL_TABLET | Freq: Every day | ORAL | Status: DC

## 2011-04-01 MED ORDER — BUPROPION HCL ER (XL) 150 MG PO TB24: 150.0000 mg | ORAL_TABLET | Freq: Every day | ORAL | Status: DC

## 2011-04-01 NOTE — Patient Instructions (Signed)
You had the pneumonia shot today Continue all other medications as before All the requested refills were sent to [facility] No new medications or changes done today Please keep your appointments with your specialists as you have planned - neurology as planned

## 2011-04-01 NOTE — Progress Notes (Signed)
Subjective:    Patient ID: [name] [name], male    DOB: 13-Oct-1935, 76 y.o.   MRN: [id number]  HPI  Here to f/u; overall doing ok;  Has had 3 falls, 2 with doing yardwork where he got off balance, fell and rolled without injury; third tripped going up a stair;  Pt denies chest pain, increased sob or doe, wheezing, orthopnea, PND, increased LE swelling, palpitations, dizziness or syncope.  Pt denies new neurological symptoms such as new headache, or facial or extremity weakness or numbness, except per wife felt by neuro to have some "dragging of the right leg."   Pt denies polydipsia, polyuria  Pt states overall good compliance with meds, trying to follow lower cholesterol diet, wt overall stable but little exercise however.  Was seen by neurology feb 2012 , felt to be improved to stable with memory.  Also seen in ER recently with fever and "stomach flu" now resolved.  Due for pneumonia shot today.    Does have some right arm distal tremor but will address with neuro next visit Dementia overall stable symptomatically with gradual worsening at best, and not assoc with behavioral changes such as hallucinations, paranoia, or agitation.  Denies worsening depressive symptoms, suicidal ideation, or panic, though has ongoing anxiety, not increased recently.   Past Medical History  Diagnosis Date  . ONYCHOMYCOSIS, TOENAILS 06/27/2008  . HYPERLIPIDEMIA 07/28/2007  . DEMENTIA 09/28/2007  . ANXIETY 09/28/2007  . DEPRESSION 07/28/2007  . OBSTRUCTIVE SLEEP APNEA 01/01/2008  . RESTLESS LEGS SYNDROME 12/26/2007  . HEARING LOSS 06/27/2008  . HYPERTENSION 07/28/2007  . MYOCARDIAL INFARCTION, HX OF 07/28/2007  . CORONARY ARTERY DISEASE 07/28/2007  . Atrial fibrillation 09/28/2007  . SINUSITIS- ACUTE-NOS 10/10/2007  . ALLERGIC RHINITIS 09/28/2007  . CHOLELITHIASIS 09/28/2007  . BENIGN PROSTATIC HYPERTROPHY 09/28/2007  . 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 CELLULITIS, HAND, LEFT 09/24/2010  . SYNCOPE 07/28/2007  . INSOMNIA-SLEEP  DISORDER-UNSPEC 04/03/2009  . HYPERSOMNIA WITH SLEEP APNEA UNSPECIFIED 12/26/2007  . FATIGUE 01/01/2008  . DYSPNEA 04/03/2009  . Wheezing 11/06/2009  . ANGIOEDEMA 11/06/2009  . COLON CANCER, HX OF 07/28/2007  . PROSTATE CANCER, HX OF 07/28/2007  . COLONIC POLYPS, HX OF 07/28/2007  . Long term current use of anticoagulant 12/22/2010   Past Surgical History  Procedure Date  . Coronary artery bypass graft 1996    x 3  . Prostatectomy 2001  . Cardioversion 2007  . Stress cardiolite 02/08/2007  . Schocardiograph 10/07/2006  . Right thyroid lobectomy     reports that he has never smoked. He does not have any smokeless tobacco history on file. He reports that he does not drink alcohol or use illicit drugs. family history includes Coronary artery disease in his father and sister and Hypertension in his other. Allergies  Allergen Reactions  . Lisinopril    Current Outpatient Prescriptions on File Prior to Visit  Medication Sig Dispense Refill  . albuterol (PROAIR HFA) 108 (90 BASE) MCG/ACT inhaler Inhale 2 puffs into the lungs every 4 (four) hours as needed. For wheezing  3 Inhaler  3  . amLODipine (NORVASC) 5 MG tablet Take 1 tablet (5 mg total) by mouth daily.  90 tablet  2  . atorvastatin (LIPITOR) 40 MG tablet Take 1 tablet (40 mg total) by mouth daily.  90 tablet  2  . buPROPion (WELLBUTRIN XL) 150 MG 24 hr tablet Take 150 mg by mouth daily.        . celecoxib (CELEBREX) 200 MG capsule Take 200 mg  by mouth 2 (two) times daily.        . Cholecalciferol (VITAMIN D3) 1000 UNITS CAPS Take by mouth daily.        . ciclesonide (OMNARIS) 50 MCG/ACT nasal spray 2 sprays by Each Nare route 2 (two) times daily.  12.5 g  3  . desloratadine (CLARINEX) 5 MG tablet Take 1 tablet (5 mg total) by mouth daily.  90 tablet  2  . docusate sodium (COLACE) 100 MG capsule Take 100 mg by mouth 2 (two) times daily.        Marland Kitchen donepezil (ARICEPT) 10 MG tablet Take 10 mg by mouth daily.        Marland Kitchen FLUoxetine (PROZAC) 40  MG capsule Take 40 mg by mouth daily.        Marland Kitchen guaiFENesin (MUCINEX) 600 MG 12 hr tablet Take 1,200 mg by mouth. Daily as needed       . l-methylfolate-b2-b6-b12 (CEREFOLIN) 04-08-49-5 MG TABS Take 1 tablet by mouth daily.        Marland Kitchen levocetirizine (XYZAL) 5 MG tablet Take 1 tablet (5 mg total) by mouth every evening.  90 tablet  2  . Multiple Vitamin (MULTIVITAMINS PO) Take by mouth daily.        . Olopatadine HCl (PATADAY) 0.2 % SOLN Apply to eye. Use as directed OU two times a day as needed       . sildenafil (VIAGRA) 100 MG tablet Take 100 mg by mouth daily as needed.        . warfarin (COUMADIN) 5 MG tablet Take by mouth as directed.        Marland Kitchen DISCONTD: ALPRAZolam (XANAX) 0.5 MG tablet Take 0.5 mg by mouth at bedtime as needed.        Marland Kitchen DISCONTD: atenolol (TENORMIN) 50 MG tablet Take 50 mg by mouth daily.        Marland Kitchen DISCONTD: busPIRone (BUSPAR) 5 MG tablet Take 5 mg by mouth daily.         Review of Systems All otherwise neg per pt, but limited due to dementa     Objective:   Physical Exam BP 122/70  Pulse 60  Temp(Src) 99 F (37.2 C) (Oral)  Ht 5\' 9"  (1.753 m)  Wt 222 lb 4 oz (100.812 kg)  BMI 32.82 kg/m2  SpO2 95% Physical Exam  VS noted Constitutional: Pt appears well-developed and well-nourished.  HENT: Head: Normocephalic.  Right Ear: External ear normal.  Left Ear: External ear normal.  Eyes: Conjunctivae and EOM are normal. Pupils are equal, round, and reactive to light.  Neck: Normal range of motion. Neck supple.  Cardiovascular: Normal rate and regular rhythm.   Pulmonary/Chest: Effort normal and breath sounds normal.  Abd:  Soft, NT, non-distended, + BS Neurological: Pt is alert. No cranial nerve deficit.  Skin: Skin is warm. No erythema.  Psychiatric: Pt behavior is normal. Thought content normal.  No tremor noted today       Assessment & Plan:

## 2011-04-05 ENCOUNTER — Encounter: Payer: Self-pay | Admitting: Internal Medicine

## 2011-04-05 NOTE — Assessment & Plan Note (Signed)
stable overall by hx and exam, most recent data reviewed with pt, and pt to continue medical treatment as before  Lab Results  Component Value Date   WBC 7.9 10/27/2010   HGB 15.4 10/27/2010   HCT 44.0 10/27/2010   PLT 145* 10/27/2010   CHOL 133 03/04/2010   TRIG 267.0* 03/04/2010   HDL 34.10* 03/04/2010   LDLDIRECT 54.5 03/04/2010   ALT 22 10/27/2010   AST 38* 10/27/2010   NA 138 10/27/2010   K 3.6 10/27/2010   CL 108 10/27/2010   CREATININE 0.93 10/27/2010   BUN 25* 10/27/2010   CO2 21 10/27/2010   TSH 1.83 07/14/2010   PSA 0.03* 03/04/2010   INR 2.1 04/01/2011   HGBA1C 5.3 03/04/2010   BP Readings from Last 3 Encounters:  04/01/11 122/70  09/24/10 130/70  08/14/10 124/70

## 2011-04-05 NOTE — Assessment & Plan Note (Addendum)
stable overall by hx and exam, and pt to continue medical treatment as before 

## 2011-04-05 NOTE — Assessment & Plan Note (Signed)
stable overall by hx and exam, most recent data reviewed with pt, and pt to continue medical treatment as before  Lab Results  Component Value Date   WBC 7.9 10/27/2010   HGB 15.4 10/27/2010   HCT 44.0 10/27/2010   PLT 145* 10/27/2010   CHOL 133 03/04/2010   TRIG 267.0* 03/04/2010   HDL 34.10* 03/04/2010   LDLDIRECT 54.5 03/04/2010   ALT 22 10/27/2010   AST 38* 10/27/2010   NA 138 10/27/2010   K 3.6 10/27/2010   CL 108 10/27/2010   CREATININE 0.93 10/27/2010   BUN 25* 10/27/2010   CO2 21 10/27/2010   TSH 1.83 07/14/2010   PSA 0.03* 03/04/2010   INR 2.1 04/01/2011   HGBA1C 5.3 03/04/2010

## 2011-04-05 NOTE — Assessment & Plan Note (Signed)
stable overall by hx and exam, most recent data reviewed with pt, and pt to continue medical treatment as before   Lab Results  Component Value Date   LDLCALC 58 01/01/2008  will need f/u LDL check

## 2011-04-06 ENCOUNTER — Telehealth: Payer: Self-pay

## 2011-04-06 MED ORDER — FLUTICASONE FUROATE 27.5 MCG/SPRAY NA SUSP: 2.0000 | Freq: Every day | NASAL | Status: DC

## 2011-04-06 NOTE — Telephone Encounter (Signed)
Ok for veramyst 2 spray/side qd   To robin to handle

## 2011-04-06 NOTE — Telephone Encounter (Signed)
[facility] to inform that [name] is mfr. Back ordered. Nothing is available with the same formula. Please provide alternative, the pharmacy informed that Veramyst  27.56mcg spr is available.

## 2011-04-29 ENCOUNTER — Encounter: Payer: Medicare Other | Admitting: *Deleted

## 2011-05-06 ENCOUNTER — Ambulatory Visit (INDEPENDENT_AMBULATORY_CARE_PROVIDER_SITE_OTHER): Payer: Medicare Other | Admitting: *Deleted

## 2011-05-06 DIAGNOSIS — Z7901 Long term (current) use of anticoagulants: Secondary | ICD-10-CM

## 2011-05-06 DIAGNOSIS — I4891 Unspecified atrial fibrillation: Secondary | ICD-10-CM

## 2011-05-06 LAB — POCT INR: INR: 2.2

## 2011-06-03 ENCOUNTER — Encounter: Payer: Medicare Other | Admitting: *Deleted

## 2011-06-08 ENCOUNTER — Ambulatory Visit (INDEPENDENT_AMBULATORY_CARE_PROVIDER_SITE_OTHER): Payer: Medicare Other | Admitting: *Deleted

## 2011-06-08 DIAGNOSIS — I4891 Unspecified atrial fibrillation: Secondary | ICD-10-CM

## 2011-06-08 DIAGNOSIS — Z7901 Long term (current) use of anticoagulants: Secondary | ICD-10-CM

## 2011-07-05 ENCOUNTER — Encounter: Payer: Medicare Other | Admitting: *Deleted

## 2011-07-05 ENCOUNTER — Ambulatory Visit: Payer: Medicare Other | Admitting: Internal Medicine

## 2011-07-07 ENCOUNTER — Encounter: Payer: Medicare Other | Admitting: *Deleted

## 2011-07-14 ENCOUNTER — Telehealth: Payer: Self-pay | Admitting: Internal Medicine

## 2011-07-14 ENCOUNTER — Ambulatory Visit (INDEPENDENT_AMBULATORY_CARE_PROVIDER_SITE_OTHER): Payer: Medicare Other | Admitting: Internal Medicine

## 2011-07-14 ENCOUNTER — Encounter: Payer: Self-pay | Admitting: Internal Medicine

## 2011-07-14 VITALS — BP 112/68 | HR 56 | Temp 98.4°F | Ht 69.0 in | Wt 229.5 lb

## 2011-07-14 DIAGNOSIS — N3281 Overactive bladder: Secondary | ICD-10-CM

## 2011-07-14 DIAGNOSIS — M5416 Radiculopathy, lumbar region: Secondary | ICD-10-CM | POA: Insufficient documentation

## 2011-07-14 DIAGNOSIS — Z23 Encounter for immunization: Secondary | ICD-10-CM

## 2011-07-14 DIAGNOSIS — F068 Other specified mental disorders due to known physiological condition: Secondary | ICD-10-CM

## 2011-07-14 DIAGNOSIS — J45909 Unspecified asthma, uncomplicated: Secondary | ICD-10-CM

## 2011-07-14 DIAGNOSIS — N318 Other neuromuscular dysfunction of bladder: Secondary | ICD-10-CM

## 2011-07-14 DIAGNOSIS — IMO0002 Reserved for concepts with insufficient information to code with codable children: Secondary | ICD-10-CM

## 2011-07-14 MED ORDER — FLUTICASONE-SALMETEROL 250-50 MCG/DOSE IN AEPB: 1.0000 | INHALATION_SPRAY | Freq: Every day | RESPIRATORY_TRACT | Status: DC

## 2011-07-14 MED ORDER — SOLIFENACIN SUCCINATE 5 MG PO TABS: 5.0000 mg | ORAL_TABLET | Freq: Every day | ORAL | Status: DC

## 2011-07-14 NOTE — Telephone Encounter (Signed)
Done per emr 

## 2011-07-14 NOTE — Patient Instructions (Signed)
Take all new medications as prescribed Continue all other medications as before You will be contacted regarding the referral for: MRI for the lumbar spine Please return in 6 months, or sooner if needed

## 2011-07-14 NOTE — Progress Notes (Signed)
Subjective:    Patient ID: [name] [name], male    DOB: 13-Jun-1935, 76 y.o.   MRN: [id number]  HPI  Here with wife for f/u;  Has some new LLE weakness (no pain) about 6 mo, noticed after doing excercises, and also pain to the RLE  - not sure if assoc with the lower back but has pain to the prox right leg as well ongoing for approx 6 mo as well;  Biggest problem is trying to stand up especially if the buttocks are lower than the knees - often has to push off ;  Has known 'draggin of the right leg" per neurology;  Brain MRI neg per wife per neurology, tends to fall once per month;  Tends to walk bent at the waist with center of gravity becoming off balance; sees chiropracter for recurrent lower back pain at least every 1-2 months, falls every 1-2 mo as well;  Has been wheezing more lately 3 times per day inhaler use. Tends to sleep a lot most days  - about 15 hrs.  Also tends to have fairly freq urinary incont episodes assoc with sudden urgency, without pain, fever, hematuria or other GU symptoms  Dementia overall stable symptomatically with gradual worsening at best, and not assoc with behavioral changes such as hallucinations, paranoia, or agitation.  Past Medical History  Diagnosis Date  . ONYCHOMYCOSIS, TOENAILS 06/27/2008  . HYPERLIPIDEMIA 07/28/2007  . DEMENTIA 09/28/2007  . ANXIETY 09/28/2007  . DEPRESSION 07/28/2007  . OBSTRUCTIVE SLEEP APNEA 01/01/2008  . RESTLESS LEGS SYNDROME 12/26/2007  . HEARING LOSS 06/27/2008  . HYPERTENSION 07/28/2007  . MYOCARDIAL INFARCTION, HX OF 07/28/2007  . CORONARY ARTERY DISEASE 07/28/2007  . Atrial fibrillation 09/28/2007  . SINUSITIS- ACUTE-NOS 10/10/2007  . ALLERGIC RHINITIS 09/28/2007  . CHOLELITHIASIS 09/28/2007  . BENIGN PROSTATIC HYPERTROPHY 09/28/2007  . 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 CELLULITIS, HAND, LEFT 09/24/2010  . SYNCOPE 07/28/2007  . INSOMNIA-SLEEP DISORDER-UNSPEC 04/03/2009  . HYPERSOMNIA WITH SLEEP APNEA UNSPECIFIED 12/26/2007  . FATIGUE  01/01/2008  . DYSPNEA 04/03/2009  . Wheezing 11/06/2009  . ANGIOEDEMA 11/06/2009  . COLON CANCER, HX OF 07/28/2007  . PROSTATE CANCER, HX OF 07/28/2007  . COLONIC POLYPS, HX OF 07/28/2007  . Long term current use of anticoagulant 12/22/2010   Past Surgical History  Procedure Date  . Coronary artery bypass graft 1996    x 3  . Prostatectomy 2001  . Cardioversion 2007  . Stress cardiolite 02/08/2007  . Schocardiograph 10/07/2006  . Right thyroid lobectomy     reports that he has never smoked. He does not have any smokeless tobacco history on file. He reports that he does not drink alcohol or use illicit drugs. family history includes Coronary artery disease in his father and sister and Hypertension in his other. Allergies  Allergen Reactions  . Lisinopril    Current Outpatient Prescriptions on File Prior to Visit  Medication Sig Dispense Refill  . albuterol (PROAIR HFA) 108 (90 BASE) MCG/ACT inhaler Inhale 2 puffs into the lungs every 4 (four) hours as needed. For wheezing  3 Inhaler  3  . ALPRAZolam (XANAX) 0.5 MG tablet Take 1 tablet (0.5 mg total) by mouth daily as needed.  90 tablet  3  . amLODipine (NORVASC) 5 MG tablet Take 1 tablet (5 mg total) by mouth daily.  90 tablet  3  . atenolol (TENORMIN) 50 MG tablet Take 1 tablet (50 mg total) by mouth daily.  90 tablet  3  . atorvastatin (LIPITOR) 40  MG tablet Take 1 tablet (40 mg total) by mouth daily.  90 tablet  2  . buPROPion (WELLBUTRIN XL) 150 MG 24 hr tablet Take 1 tablet (150 mg total) by mouth daily.  90 tablet  3  . celecoxib (CELEBREX) 200 MG capsule Take 1 capsule (200 mg total) by mouth 2 (two) times daily.  180 capsule  3  . Cholecalciferol (VITAMIN D3) 1000 UNITS CAPS Take by mouth daily.        . ciclesonide (OMNARIS) 50 MCG/ACT nasal spray 2 sprays by Each Nare route 2 (two) times daily.  360 Act  3  . clonazePAM (KLONOPIN) 1 MG tablet Take 1 tablet (1 mg total) by mouth at bedtime as needed for anxiety.  30 tablet  0  .  desloratadine (CLARINEX) 5 MG tablet Take 1 tablet (5 mg total) by mouth daily.  90 tablet  2  . docusate sodium (COLACE) 100 MG capsule Take 100 mg by mouth 2 (two) times daily.        Marland Kitchen donepezil (ARICEPT) 10 MG tablet Take 10 mg by mouth daily.        Marland Kitchen FLUoxetine (PROZAC) 40 MG capsule Take 1 capsule (40 mg total) by mouth daily.  90 capsule  3  . fluticasone (VERAMYST) 27.5 MCG/SPRAY nasal spray Place 2 sprays into the nose daily.  10 g  3  . guaiFENesin (MUCINEX) 600 MG 12 hr tablet Take 1,200 mg by mouth. Daily as needed       . l-methylfolate-b2-b6-b12 (CEREFOLIN) 04-08-49-5 MG TABS Take 1 tablet by mouth daily.  90 tablet  3  . levocetirizine (XYZAL) 5 MG tablet Take 1 tablet (5 mg total) by mouth every evening.  90 tablet  2  . Multiple Vitamin (MULTIVITAMINS PO) Take by mouth daily.        . Olopatadine HCl (PATADAY) 0.2 % SOLN Apply 1 drop to eye 2 (two) times daily. Use as directed OU two times a day as needed  3 Bottle  3  . sildenafil (VIAGRA) 100 MG tablet Take 100 mg by mouth daily as needed.        . warfarin (COUMADIN) 5 MG tablet Take 1 tablet (5 mg total) by mouth as directed.  100 tablet  3   Review of Systems Review of Systems  Constitutional: Negative for diaphoresis and unexpected weight change.  HENT: Negative for drooling and tinnitus.   Eyes: Negative for photophobia and visual disturbance.  Respiratory: Negative for choking and stridor.   Gastrointestinal: Negative for vomiting and blood in stool.  Genitourinary: Negative for hematuria and decreased urine volume.      Objective:   Physical Exam BP 112/68  Pulse 56  Temp(Src) 98.4 F (36.9 C) (Oral)  Ht 5\' 9"  (1.753 m)  Wt 229 lb 8 oz (104.101 kg)  BMI 33.89 kg/m2  SpO2 95% Physical Exam  VS noted Constitutional: Pt appears well-developed and well-nourished.  HENT: Head: Normocephalic.  Right Ear: External ear normal.  Left Ear: External ear normal.  Eyes: Conjunctivae and EOM are normal. Pupils are  equal, round, and reactive to light.  Neck: Normal range of motion. Neck supple.  Cardiovascular: Normal rate and regular rhythm.   Pulmonary/Chest: Effort normal and breath sounds normal.  Abd:  Soft, NT, non-distended, + BS Neurological: Pt is alert. No cranial nerve deficit. motor 4+/5 LLE Skin: Skin is warm. No erythema.  Psychiatric: Pt behavior is normal. Thought content c/w mod to severe dementia.  Spine nontender today  Of note total couseling and history taking, review of records > 40 min due to dementia     Assessment & Plan:

## 2011-07-14 NOTE — Assessment & Plan Note (Signed)
With urge incontinence - for vesicare 5 qd,  to f/u any worsening symptoms or concerns

## 2011-07-14 NOTE — Assessment & Plan Note (Signed)
With pain to the right, and weakness of left; with assoc with back pain - for ls spine mri

## 2011-07-14 NOTE — Assessment & Plan Note (Signed)
Uncontrolled - to add the advair asd,  to f/u any worsening symptoms or concerns

## 2011-07-14 NOTE — Assessment & Plan Note (Signed)
stable overall by hx and exam, most recent data reviewed with pt, and pt to continue medical treatment as before  Lab Results  Component Value Date   WBC 7.9 10/27/2010   HGB 15.4 10/27/2010   HCT 44.0 10/27/2010   PLT 145* 10/27/2010   CHOL 133 03/04/2010   TRIG 267.0* 03/04/2010   HDL 34.10* 03/04/2010   LDLDIRECT 54.5 03/04/2010   ALT 22 10/27/2010   AST 38* 10/27/2010   NA 138 10/27/2010   K 3.6 10/27/2010   CL 108 10/27/2010   CREATININE 0.93 10/27/2010   BUN 25* 10/27/2010   CO2 21 10/27/2010   TSH 1.83 07/14/2010   PSA 0.03* 03/04/2010   INR 2.2 06/08/2011   HGBA1C 5.3 03/04/2010

## 2011-07-14 NOTE — Progress Notes (Signed)
Addended by: Scharlene Gloss B on: 07/14/2011 04:18 PM   Modules accepted: Orders

## 2011-07-19 ENCOUNTER — Encounter: Payer: Medicare Other | Admitting: *Deleted

## 2011-07-21 ENCOUNTER — Ambulatory Visit (INDEPENDENT_AMBULATORY_CARE_PROVIDER_SITE_OTHER): Payer: Medicare Other | Admitting: *Deleted

## 2011-07-21 DIAGNOSIS — Z7901 Long term (current) use of anticoagulants: Secondary | ICD-10-CM

## 2011-07-21 DIAGNOSIS — I4891 Unspecified atrial fibrillation: Secondary | ICD-10-CM

## 2011-07-21 LAB — POCT INR: INR: 1.4

## 2011-07-29 ENCOUNTER — Ambulatory Visit
Admission: RE | Admit: 2011-07-29 | Discharge: 2011-07-29 | Disposition: A | Discharge status: Home / Self Care | Payer: Medicare Other | Source: Ambulatory Visit | Attending: Internal Medicine | Admitting: Internal Medicine

## 2011-07-29 ENCOUNTER — Other Ambulatory Visit: Payer: Self-pay | Admitting: Internal Medicine

## 2011-07-29 DIAGNOSIS — M5416 Radiculopathy, lumbar region: Secondary | ICD-10-CM

## 2011-08-04 ENCOUNTER — Other Ambulatory Visit: Payer: Self-pay

## 2011-08-04 ENCOUNTER — Ambulatory Visit (INDEPENDENT_AMBULATORY_CARE_PROVIDER_SITE_OTHER): Payer: Medicare Other | Admitting: *Deleted

## 2011-08-04 DIAGNOSIS — I4891 Unspecified atrial fibrillation: Secondary | ICD-10-CM

## 2011-08-04 DIAGNOSIS — Z7901 Long term (current) use of anticoagulants: Secondary | ICD-10-CM

## 2011-08-04 LAB — POCT INR: INR: 2.8

## 2011-08-04 MED ORDER — ALPRAZOLAM 0.5 MG PO TABS: 0.5000 mg | ORAL_TABLET | Freq: Three times a day (TID) | ORAL | Status: DC | PRN

## 2011-08-04 NOTE — Telephone Encounter (Signed)
Done hardcopy to robin  

## 2011-08-04 NOTE — Telephone Encounter (Signed)
Faxed hardcopy to [facility]

## 2011-08-17 LAB — CBC
HCT: 37.3 — ABNORMAL LOW
Hemoglobin: 12.9 — ABNORMAL LOW
RBC: 4.1 — ABNORMAL LOW
WBC: 6.2

## 2011-08-17 LAB — DIFFERENTIAL
Basophils Absolute: 0.1
Basophils Relative: 1
Monocytes Absolute: 1
Neutro Abs: 4
Neutrophils Relative %: 65

## 2011-08-17 LAB — COMPREHENSIVE METABOLIC PANEL
Alkaline Phosphatase: 51
BUN: 11
Chloride: 103
Glucose, Bld: 129 — ABNORMAL HIGH
Potassium: 4.1
Total Bilirubin: 1.3 — ABNORMAL HIGH

## 2011-08-17 LAB — PROTIME-INR: INR: 2.6 — ABNORMAL HIGH

## 2011-08-19 ENCOUNTER — Other Ambulatory Visit: Payer: Self-pay

## 2011-08-19 MED ORDER — ATORVASTATIN CALCIUM 40 MG PO TABS: 40.0000 mg | ORAL_TABLET | Freq: Every day | ORAL | Status: DC

## 2011-08-19 MED ORDER — CLONAZEPAM 1 MG PO TABS: 1.0000 mg | ORAL_TABLET | Freq: Every evening | ORAL | Status: DC | PRN

## 2011-08-19 NOTE — Telephone Encounter (Signed)
Done hardcopy to robin  

## 2011-08-19 NOTE — Telephone Encounter (Signed)
Faxed hardcopy to [facility] [phone]

## 2011-08-25 LAB — PROTIME-INR: Prothrombin Time: 12.7

## 2011-08-26 LAB — URINALYSIS, ROUTINE W REFLEX MICROSCOPIC
Bilirubin Urine: NEGATIVE
Glucose, UA: NEGATIVE
Hgb urine dipstick: NEGATIVE
Ketones, ur: NEGATIVE
Leukocytes, UA: NEGATIVE
Protein, ur: NEGATIVE
pH: 7

## 2011-08-26 LAB — DIFFERENTIAL
Basophils Absolute: 0.1
Basophils Relative: 1
Eosinophils Absolute: 0.2
Eosinophils Relative: 3
Monocytes Absolute: 0.7
Neutro Abs: 3.8

## 2011-08-26 LAB — BASIC METABOLIC PANEL
CO2: 30
Calcium: 9
Chloride: 105
Glucose, Bld: 92
Sodium: 139

## 2011-08-26 LAB — CBC
Hemoglobin: 14.9
MCHC: 34.4
MCV: 89.2
RDW: 14.2 — ABNORMAL HIGH

## 2011-08-31 ENCOUNTER — Ambulatory Visit (INDEPENDENT_AMBULATORY_CARE_PROVIDER_SITE_OTHER): Payer: Medicare Other | Admitting: *Deleted

## 2011-08-31 ENCOUNTER — Telehealth: Payer: Self-pay

## 2011-08-31 DIAGNOSIS — Z7901 Long term (current) use of anticoagulants: Secondary | ICD-10-CM

## 2011-08-31 DIAGNOSIS — I4891 Unspecified atrial fibrillation: Secondary | ICD-10-CM

## 2011-08-31 LAB — POCT INR: INR: 3.4

## 2011-08-31 NOTE — Telephone Encounter (Signed)
Will forward to MD for recommendations and orders

## 2011-08-31 NOTE — Telephone Encounter (Signed)
Pt's wife called states pt needs Cortisone injection in his back for spinal stenosis.  Needs clearance to hold Coumadin x 5 days prior to.  Please advise if ok to hold Coumadin.

## 2011-09-01 ENCOUNTER — Encounter: Payer: Medicare Other | Admitting: *Deleted

## 2011-09-03 ENCOUNTER — Telehealth: Payer: Self-pay | Admitting: Cardiology

## 2011-09-03 NOTE — Telephone Encounter (Signed)
Spoke with Diane and reported to her that I do not have an answer for her yet however I don't think it will be a problem.  Pt has held coumadin in the past for other procedures without any problems.  She is aware I will call her back as soon as I have an answer.

## 2011-09-03 NOTE — Telephone Encounter (Signed)
Pt called wanted to know about stopping coumadin for five days so he can injection in his spine

## 2011-09-03 NOTE — Telephone Encounter (Signed)
There is another phone message about pts need to hold coumadin 5 days prior to his injection.  Awaiting Dr Hochrein's orders.

## 2011-09-06 NOTE — Telephone Encounter (Signed)
Ok to hold warfarin as needed for a cortisone injection.  He will hold it for six days prior to the procedure.  They already have plans to get the PT checked by the injecting MD.  They will not do it unless the INR is less than 1.5.  I asked them to get instructions from that MD about when to start it back.

## 2011-09-07 ENCOUNTER — Telehealth: Payer: Self-pay | Admitting: Cardiology

## 2011-09-07 NOTE — Telephone Encounter (Signed)
Pt wife calling stating that [employer] needs an order in writing that pt needs to stop coumadin for six days prior to receiving shot in his back.   Please send order to [facility], Fax Number [phone]

## 2011-09-07 NOTE — Telephone Encounter (Signed)
Wife aware order faxed to [facility].

## 2011-09-07 NOTE — Telephone Encounter (Signed)
Left message for Dianne and pt to make sure they did receive the instructions given by Dr Antoine Poche.  Requested they call back if any questions or concerns

## 2011-09-13 ENCOUNTER — Ambulatory Visit (INDEPENDENT_AMBULATORY_CARE_PROVIDER_SITE_OTHER): Payer: Medicare Other | Admitting: *Deleted

## 2011-09-13 DIAGNOSIS — I4891 Unspecified atrial fibrillation: Secondary | ICD-10-CM

## 2011-09-13 DIAGNOSIS — Z7901 Long term (current) use of anticoagulants: Secondary | ICD-10-CM

## 2011-09-13 LAB — POCT INR: INR: 1

## 2011-09-16 ENCOUNTER — Other Ambulatory Visit: Payer: Self-pay | Admitting: Internal Medicine

## 2011-09-16 MED ORDER — DESLORATADINE 5 MG PO TABS: 5.0000 mg | ORAL_TABLET | Freq: Every day | ORAL | Status: DC

## 2011-09-16 MED ORDER — LEVOCETIRIZINE DIHYDROCHLORIDE 5 MG PO TABS: 5.0000 mg | ORAL_TABLET | Freq: Every evening | ORAL | Status: DC

## 2011-09-20 ENCOUNTER — Other Ambulatory Visit: Payer: Self-pay | Admitting: Internal Medicine

## 2011-09-21 ENCOUNTER — Encounter: Payer: Medicare Other | Admitting: *Deleted

## 2011-09-21 ENCOUNTER — Inpatient Hospital Stay (HOSPITAL_COMMUNITY)
Admission: EM | Admit: 2011-09-21 | Discharge: 2011-09-27 | DRG: 871 | Disposition: A | Discharge status: Skilled Nursing Facility | Payer: Medicare Other | Attending: Internal Medicine | Admitting: Internal Medicine

## 2011-09-21 ENCOUNTER — Other Ambulatory Visit: Payer: Self-pay

## 2011-09-21 ENCOUNTER — Emergency Department (HOSPITAL_COMMUNITY): Payer: Medicare Other

## 2011-09-21 ENCOUNTER — Encounter (HOSPITAL_COMMUNITY): Payer: Self-pay | Admitting: Emergency Medicine

## 2011-09-21 DIAGNOSIS — A419 Sepsis, unspecified organism: Secondary | ICD-10-CM

## 2011-09-21 DIAGNOSIS — G4733 Obstructive sleep apnea (adult) (pediatric): Secondary | ICD-10-CM

## 2011-09-21 DIAGNOSIS — Z8546 Personal history of malignant neoplasm of prostate: Secondary | ICD-10-CM

## 2011-09-21 DIAGNOSIS — F068 Other specified mental disorders due to known physiological condition: Secondary | ICD-10-CM

## 2011-09-21 DIAGNOSIS — N529 Male erectile dysfunction, unspecified: Secondary | ICD-10-CM

## 2011-09-21 DIAGNOSIS — J309 Allergic rhinitis, unspecified: Secondary | ICD-10-CM

## 2011-09-21 DIAGNOSIS — R791 Abnormal coagulation profile: Secondary | ICD-10-CM | POA: Diagnosis present

## 2011-09-21 DIAGNOSIS — J45909 Unspecified asthma, uncomplicated: Secondary | ICD-10-CM

## 2011-09-21 DIAGNOSIS — R6520 Severe sepsis without septic shock: Secondary | ICD-10-CM | POA: Diagnosis present

## 2011-09-21 DIAGNOSIS — J189 Pneumonia, unspecified organism: Secondary | ICD-10-CM

## 2011-09-21 DIAGNOSIS — G4710 Hypersomnia, unspecified: Secondary | ICD-10-CM

## 2011-09-21 DIAGNOSIS — Z860100 Personal history of colon polyps, unspecified: Secondary | ICD-10-CM

## 2011-09-21 DIAGNOSIS — N3281 Overactive bladder: Secondary | ICD-10-CM

## 2011-09-21 DIAGNOSIS — G4700 Insomnia, unspecified: Secondary | ICD-10-CM

## 2011-09-21 DIAGNOSIS — H9190 Unspecified hearing loss, unspecified ear: Secondary | ICD-10-CM

## 2011-09-21 DIAGNOSIS — Z951 Presence of aortocoronary bypass graft: Secondary | ICD-10-CM

## 2011-09-21 DIAGNOSIS — Z7901 Long term (current) use of anticoagulants: Secondary | ICD-10-CM

## 2011-09-21 DIAGNOSIS — T45515A Adverse effect of anticoagulants, initial encounter: Secondary | ICD-10-CM | POA: Diagnosis present

## 2011-09-21 DIAGNOSIS — F3289 Other specified depressive episodes: Secondary | ICD-10-CM

## 2011-09-21 DIAGNOSIS — F411 Generalized anxiety disorder: Secondary | ICD-10-CM

## 2011-09-21 DIAGNOSIS — I10 Essential (primary) hypertension: Secondary | ICD-10-CM

## 2011-09-21 DIAGNOSIS — N400 Enlarged prostate without lower urinary tract symptoms: Secondary | ICD-10-CM

## 2011-09-21 DIAGNOSIS — R5381 Other malaise: Secondary | ICD-10-CM

## 2011-09-21 DIAGNOSIS — J9600 Acute respiratory failure, unspecified whether with hypoxia or hypercapnia: Secondary | ICD-10-CM | POA: Diagnosis present

## 2011-09-21 DIAGNOSIS — K8020 Calculus of gallbladder without cholecystitis without obstruction: Secondary | ICD-10-CM

## 2011-09-21 DIAGNOSIS — Z79899 Other long term (current) drug therapy: Secondary | ICD-10-CM

## 2011-09-21 DIAGNOSIS — R5383 Other fatigue: Secondary | ICD-10-CM

## 2011-09-21 DIAGNOSIS — F0390 Unspecified dementia without behavioral disturbance: Secondary | ICD-10-CM | POA: Diagnosis present

## 2011-09-21 DIAGNOSIS — I2510 Atherosclerotic heart disease of native coronary artery without angina pectoris: Secondary | ICD-10-CM

## 2011-09-21 DIAGNOSIS — K5909 Other constipation: Secondary | ICD-10-CM | POA: Diagnosis present

## 2011-09-21 DIAGNOSIS — R55 Syncope and collapse: Secondary | ICD-10-CM

## 2011-09-21 DIAGNOSIS — Z85038 Personal history of other malignant neoplasm of large intestine: Secondary | ICD-10-CM

## 2011-09-21 DIAGNOSIS — Z0000 Encounter for general adult medical examination without abnormal findings: Secondary | ICD-10-CM

## 2011-09-21 DIAGNOSIS — K644 Residual hemorrhoidal skin tags: Secondary | ICD-10-CM | POA: Diagnosis not present

## 2011-09-21 DIAGNOSIS — G2581 Restless legs syndrome: Secondary | ICD-10-CM

## 2011-09-21 DIAGNOSIS — F329 Major depressive disorder, single episode, unspecified: Secondary | ICD-10-CM

## 2011-09-21 DIAGNOSIS — I252 Old myocardial infarction: Secondary | ICD-10-CM

## 2011-09-21 DIAGNOSIS — M5416 Radiculopathy, lumbar region: Secondary | ICD-10-CM

## 2011-09-21 DIAGNOSIS — J9690 Respiratory failure, unspecified, unspecified whether with hypoxia or hypercapnia: Secondary | ICD-10-CM

## 2011-09-21 DIAGNOSIS — E785 Hyperlipidemia, unspecified: Secondary | ICD-10-CM

## 2011-09-21 DIAGNOSIS — I4891 Unspecified atrial fibrillation: Secondary | ICD-10-CM

## 2011-09-21 HISTORY — DX: Sepsis, unspecified organism: A41.9

## 2011-09-21 HISTORY — DX: Spinal stenosis, site unspecified: M48.00

## 2011-09-21 HISTORY — DX: Personal history of other diseases of the circulatory system: Z86.79

## 2011-09-21 LAB — URINALYSIS, ROUTINE W REFLEX MICROSCOPIC
Glucose, UA: NEGATIVE mg/dL
Ketones, ur: NEGATIVE mg/dL
Leukocytes, UA: NEGATIVE
Nitrite: NEGATIVE
Specific Gravity, Urine: 1.03 (ref 1.005–1.030)
pH: 6 (ref 5.0–8.0)

## 2011-09-21 LAB — COMPREHENSIVE METABOLIC PANEL
AST: 25 U/L (ref 0–37)
Albumin: 3.7 g/dL (ref 3.5–5.2)
BUN: 16 mg/dL (ref 6–23)
Calcium: 9 mg/dL (ref 8.4–10.5)
Chloride: 102 mEq/L (ref 96–112)
Creatinine, Ser: 0.89 mg/dL (ref 0.50–1.35)
Total Bilirubin: 1.4 mg/dL — ABNORMAL HIGH (ref 0.3–1.2)
Total Protein: 6.6 g/dL (ref 6.0–8.3)

## 2011-09-21 LAB — TROPONIN I: Troponin I: <0.3 ng/mL (ref <0.30)

## 2011-09-21 LAB — LACTIC ACID, PLASMA: Lactic Acid, Venous: 2.4 mmol/L — ABNORMAL HIGH (ref 0.5–2.2)

## 2011-09-21 LAB — CBC
HCT: 41.8 % (ref 39.0–52.0)
MCHC: 34.7 g/dL (ref 30.0–36.0)
Platelets: 140 10*3/uL — ABNORMAL LOW (ref 150–400)
RDW: 14.5 % (ref 11.5–15.5)

## 2011-09-21 LAB — PROTIME-INR: Prothrombin Time: 18.8 seconds — ABNORMAL HIGH (ref 11.6–15.2)

## 2011-09-21 MED ORDER — LORATADINE 10 MG PO TABS
10.0000 mg | ORAL_TABLET | Freq: Every day | ORAL | Status: DC
  Administered 2011-09-21 – 2011-09-27 (×7): 10 mg via ORAL
  Filled 2011-09-21 (×8): qty 1

## 2011-09-21 MED ORDER — FA-PYRIDOXINE-CYANOCOBALAMIN 2.5-25-2 MG PO TABS
2.0000 | ORAL_TABLET | Freq: Every day | ORAL | Status: DC
  Administered 2011-09-21 – 2011-09-27 (×7): 2 via ORAL
  Filled 2011-09-21 (×7): qty 2

## 2011-09-21 MED ORDER — L-METHYLFOLATE-B12-B6-B2 6-1-50-5 MG PO TABS: 1.0000 | ORAL_TABLET | Freq: Every day | ORAL | Status: DC

## 2011-09-21 MED ORDER — AZITHROMYCIN 500 MG IV SOLR
500.0000 mg | INTRAVENOUS | Status: DC
  Administered 2011-09-22 – 2011-09-23 (×2): 500 mg via INTRAVENOUS
  Filled 2011-09-21 (×3): qty 500

## 2011-09-21 MED ORDER — FLUTICASONE PROPIONATE 50 MCG/ACT NA SUSP
2.0000 | Freq: Every day | NASAL | Status: DC
  Administered 2011-09-21 – 2011-09-27 (×7): 2 via NASAL
  Filled 2011-09-21: qty 16

## 2011-09-21 MED ORDER — VANCOMYCIN HCL IN DEXTROSE 1-5 GM/200ML-% IV SOLN
1000.0000 mg | Freq: Once | INTRAVENOUS | Status: AC
  Administered 2011-09-21: 1000 mg via INTRAVENOUS
  Filled 2011-09-21: qty 200

## 2011-09-21 MED ORDER — ONDANSETRON HCL 4 MG/2ML IJ SOLN
INTRAMUSCULAR | Status: AC
  Administered 2011-09-21: 03:00:00
  Filled 2011-09-21: qty 2

## 2011-09-21 MED ORDER — ALBUTEROL SULFATE (5 MG/ML) 0.5% IN NEBU: 2.5000 mg | INHALATION_SOLUTION | RESPIRATORY_TRACT | Status: DC | PRN

## 2011-09-21 MED ORDER — FLUTICASONE-SALMETEROL 250-50 MCG/DOSE IN AEPB
1.0000 | INHALATION_SPRAY | Freq: Every day | RESPIRATORY_TRACT | Status: DC
  Administered 2011-09-21 – 2011-09-27 (×7): 1 via RESPIRATORY_TRACT
  Filled 2011-09-21: qty 14

## 2011-09-21 MED ORDER — DONEPEZIL HCL 10 MG PO TABS
10.0000 mg | ORAL_TABLET | Freq: Every day | ORAL | Status: DC
  Administered 2011-09-21 – 2011-09-27 (×7): 10 mg via ORAL
  Filled 2011-09-21 (×8): qty 1

## 2011-09-21 MED ORDER — FUROSEMIDE 10 MG/ML IJ SOLN
20.0000 mg | Freq: Two times a day (BID) | INTRAMUSCULAR | Status: DC
  Administered 2011-09-21 – 2011-09-26 (×11): 20 mg via INTRAVENOUS
  Filled 2011-09-21 (×12): qty 2

## 2011-09-21 MED ORDER — PIPERACILLIN-TAZOBACTAM 3.375 G IVPB
3.3750 g | Freq: Once | INTRAVENOUS | Status: AC
  Administered 2011-09-21: 3.375 g via INTRAVENOUS
  Filled 2011-09-21: qty 50

## 2011-09-21 MED ORDER — AMLODIPINE BESYLATE 5 MG PO TABS
5.0000 mg | ORAL_TABLET | Freq: Every day | ORAL | Status: DC
  Administered 2011-09-21 – 2011-09-27 (×7): 5 mg via ORAL
  Filled 2011-09-21 (×8): qty 1

## 2011-09-21 MED ORDER — CEFTRIAXONE SODIUM 1 G IJ SOLR
1.0000 g | INTRAMUSCULAR | Status: DC
  Administered 2011-09-21 – 2011-09-23 (×3): 1 g via INTRAVENOUS
  Filled 2011-09-21 (×3): qty 10

## 2011-09-21 MED ORDER — WARFARIN SODIUM 7.5 MG PO TABS
7.5000 mg | ORAL_TABLET | Freq: Once | ORAL | Status: AC
  Administered 2011-09-21: 7.5 mg via ORAL
  Filled 2011-09-21: qty 1

## 2011-09-21 MED ORDER — DEXTROSE 5 % IV SOLN
1.0000 g | INTRAVENOUS | Status: DC
  Administered 2011-09-21: 1 g via INTRAVENOUS
  Filled 2011-09-21: qty 10

## 2011-09-21 MED ORDER — ALPRAZOLAM 0.5 MG PO TABS
0.5000 mg | ORAL_TABLET | Freq: Three times a day (TID) | ORAL | Status: DC | PRN
  Administered 2011-09-23: 0.5 mg via ORAL
  Filled 2011-09-21: qty 1

## 2011-09-21 MED ORDER — IPRATROPIUM BROMIDE 0.02 % IN SOLN
RESPIRATORY_TRACT | Status: AC
  Administered 2011-09-21: 0.5 mg via RESPIRATORY_TRACT
  Filled 2011-09-21: qty 2.5

## 2011-09-21 MED ORDER — ALBUTEROL SULFATE (5 MG/ML) 0.5% IN NEBU
INHALATION_SOLUTION | RESPIRATORY_TRACT | Status: AC
  Administered 2011-09-21: 2.5 mg via RESPIRATORY_TRACT
  Filled 2011-09-21: qty 0.5

## 2011-09-21 MED ORDER — FLUTICASONE FUROATE 27.5 MCG/SPRAY NA SUSP: 2.0000 | Freq: Every day | NASAL | Status: DC

## 2011-09-21 MED ORDER — SODIUM CHLORIDE 0.9 % IJ SOLN
3.0000 mL | Freq: Two times a day (BID) | INTRAMUSCULAR | Status: DC
  Administered 2011-09-21 – 2011-09-27 (×13): 3 mL via INTRAVENOUS

## 2011-09-21 MED ORDER — ALBUTEROL SULFATE (5 MG/ML) 0.5% IN NEBU
2.5000 mg | INHALATION_SOLUTION | Freq: Four times a day (QID) | RESPIRATORY_TRACT | Status: DC
Start: 2011-09-21 — End: 2011-09-22
  Administered 2011-09-21 – 2011-09-22 (×6): 2.5 mg via RESPIRATORY_TRACT
  Filled 2011-09-21 (×5): qty 0.5

## 2011-09-21 MED ORDER — IBUPROFEN 400 MG PO TABS
400.0000 mg | ORAL_TABLET | Freq: Four times a day (QID) | ORAL | Status: DC | PRN
  Filled 2011-09-21: qty 1

## 2011-09-21 MED ORDER — DEXTROSE 5 % IV SOLN
500.0000 mg | INTRAVENOUS | Status: DC
  Filled 2011-09-21: qty 500

## 2011-09-21 MED ORDER — ROSUVASTATIN CALCIUM 20 MG PO TABS
20.0000 mg | ORAL_TABLET | Freq: Every day | ORAL | Status: DC
  Administered 2011-09-21 – 2011-09-27 (×7): 20 mg via ORAL
  Filled 2011-09-21 (×8): qty 1

## 2011-09-21 MED ORDER — SODIUM CHLORIDE 0.9 % IV SOLN: 250.0000 mL | INTRAVENOUS | Status: DC

## 2011-09-21 MED ORDER — WARFARIN SODIUM 5 MG PO TABS: 5.0000 mg | ORAL_TABLET | Freq: Every day | ORAL | Status: DC

## 2011-09-21 MED ORDER — VANCOMYCIN HCL IN DEXTROSE 1-5 GM/200ML-% IV SOLN: 1000.0000 mg | Freq: Two times a day (BID) | INTRAVENOUS | Status: DC

## 2011-09-21 MED ORDER — SODIUM CHLORIDE 0.9 % IJ SOLN: 3.0000 mL | INTRAMUSCULAR | Status: DC | PRN

## 2011-09-21 MED ORDER — ONDANSETRON HCL 4 MG/2ML IJ SOLN: 4.0000 mg | Freq: Four times a day (QID) | INTRAMUSCULAR | Status: DC

## 2011-09-21 MED ORDER — ATENOLOL 50 MG PO TABS
50.0000 mg | ORAL_TABLET | Freq: Every day | ORAL | Status: DC
  Administered 2011-09-21 – 2011-09-27 (×7): 50 mg via ORAL
  Filled 2011-09-21 (×8): qty 1

## 2011-09-21 MED ORDER — CLONAZEPAM 1 MG PO TABS
1.0000 mg | ORAL_TABLET | Freq: Every evening | ORAL | Status: DC | PRN
  Administered 2011-09-23: 1 mg via ORAL
  Filled 2011-09-21: qty 1

## 2011-09-21 MED ORDER — ACETAMINOPHEN 325 MG PO TABS: 650.0000 mg | ORAL_TABLET | Freq: Four times a day (QID) | ORAL | Status: DC | PRN

## 2011-09-21 MED ORDER — MULTIVITAMINS PO TABS
1.0000 | ORAL_TABLET | Freq: Every day | ORAL | Status: DC
  Administered 2011-09-21 – 2011-09-23 (×3): 1 via ORAL
  Filled 2011-09-21 (×4): qty 1

## 2011-09-21 MED ORDER — IPRATROPIUM BROMIDE 0.02 % IN SOLN
0.5000 mg | Freq: Four times a day (QID) | RESPIRATORY_TRACT | Status: DC
  Administered 2011-09-21 – 2011-09-22 (×6): 0.5 mg via RESPIRATORY_TRACT
  Filled 2011-09-21 (×5): qty 2.5

## 2011-09-21 MED ORDER — ONDANSETRON HCL 4 MG/2ML IJ SOLN: 4.0000 mg | Freq: Four times a day (QID) | INTRAMUSCULAR | Status: DC | PRN

## 2011-09-21 MED ORDER — VITAMIN D3 25 MCG (1000 UT) PO CAPS
1000.0000 [IU] | ORAL_CAPSULE | Freq: Every day | ORAL | Status: DC
  Administered 2011-09-21: 1000 [IU] via ORAL
  Filled 2011-09-21 (×2): qty 1

## 2011-09-21 MED ORDER — DARIFENACIN HYDROBROMIDE ER 7.5 MG PO TB24
7.5000 mg | ORAL_TABLET | Freq: Every day | ORAL | Status: DC
  Administered 2011-09-21 – 2011-09-27 (×7): 7.5 mg via ORAL
  Filled 2011-09-21 (×8): qty 1

## 2011-09-21 MED ORDER — LEVOCETIRIZINE DIHYDROCHLORIDE 5 MG PO TABS: 5.0000 mg | ORAL_TABLET | Freq: Every evening | ORAL | Status: DC

## 2011-09-21 MED ORDER — GUAIFENESIN ER 600 MG PO TB12
1200.0000 mg | ORAL_TABLET | Freq: Two times a day (BID) | ORAL | Status: DC
  Administered 2011-09-21 – 2011-09-27 (×13): 1200 mg via ORAL
  Filled 2011-09-21 (×15): qty 2

## 2011-09-21 MED ORDER — ACETAMINOPHEN 325 MG PO TABS
ORAL_TABLET | ORAL | Status: AC
  Administered 2011-09-21: 975 mg
  Filled 2011-09-21: qty 3

## 2011-09-21 MED ORDER — BUPROPION HCL ER (XL) 150 MG PO TB24
150.0000 mg | ORAL_TABLET | Freq: Every day | ORAL | Status: DC
  Administered 2011-09-21 – 2011-09-27 (×7): 150 mg via ORAL
  Filled 2011-09-21 (×8): qty 1

## 2011-09-21 MED ORDER — DIPHENHYDRAMINE HCL 25 MG PO CAPS: 25.0000 mg | ORAL_CAPSULE | Freq: Four times a day (QID) | ORAL | Status: DC | PRN

## 2011-09-21 MED ORDER — VANCOMYCIN HCL IN DEXTROSE 1-5 GM/200ML-% IV SOLN
1000.0000 mg | Freq: Two times a day (BID) | INTRAVENOUS | Status: DC
  Administered 2011-09-21 – 2011-09-22 (×2): 1000 mg via INTRAVENOUS
  Filled 2011-09-21 (×3): qty 200

## 2011-09-21 MED ORDER — CELECOXIB 200 MG PO CAPS
200.0000 mg | ORAL_CAPSULE | Freq: Two times a day (BID) | ORAL | Status: DC
  Administered 2011-09-21 – 2011-09-27 (×13): 200 mg via ORAL
  Filled 2011-09-21 (×15): qty 1

## 2011-09-21 MED ORDER — DOCUSATE SODIUM 100 MG PO CAPS
100.0000 mg | ORAL_CAPSULE | Freq: Two times a day (BID) | ORAL | Status: DC
  Administered 2011-09-21 – 2011-09-27 (×13): 100 mg via ORAL
  Filled 2011-09-21 (×15): qty 1

## 2011-09-21 MED ORDER — AZITHROMYCIN 500 MG IV SOLR
500.0000 mg | INTRAVENOUS | Status: DC
  Administered 2011-09-21: 500 mg via INTRAVENOUS
  Filled 2011-09-21: qty 500

## 2011-09-21 MED ORDER — FLUOXETINE HCL 20 MG PO CAPS
40.0000 mg | ORAL_CAPSULE | Freq: Every day | ORAL | Status: DC
  Administered 2011-09-21 – 2011-09-27 (×7): 40 mg via ORAL
  Filled 2011-09-21 (×8): qty 2

## 2011-09-21 NOTE — Progress Notes (Signed)
CONSULT NOTE - INITIAL  Pharmacy Consult for Vancomycin and Coumadin Indication: rule out pneumonia, history of A. Fib  Allergies  Allergen Reactions  . Lisinopril Swelling    Patient Measurements: Height: 5\' 9"  (175.3 cm) Weight: 230 lb 6.1 oz (104.5 kg) IBW/kg (Calculated) : 70.7   Vital Signs: Temp: 99.9 F (37.7 C) (11/13 0645) Temp src: Oral (11/13 0429) BP: 98/58 mmHg (11/13 0645) Pulse Rate: 70  (11/13 0645)  Labs:  Basename 09/21/11 0303  WBC 9.7  HGB 14.5  PLT 140*  LABCREA --  CREATININE 0.89   Estimated Creatinine Clearance: 86.7 ml/min (by C-G formula based on Cr of 0.89).  Microbiology: No results found for this or any previous visit (from the past 720 hour(s)).  Medical History: Past Medical History  Diagnosis Date  . ONYCHOMYCOSIS, TOENAILS 06/27/2008  . HYPERLIPIDEMIA 07/28/2007  . ANXIETY 09/28/2007  . OBSTRUCTIVE SLEEP APNEA 01/01/2008  . RESTLESS LEGS SYNDROME 12/26/2007  . HEARING LOSS 06/27/2008  . HYPERTENSION 07/28/2007  . MYOCARDIAL INFARCTION, HX OF 07/28/2007  . CORONARY ARTERY DISEASE 07/28/2007  . Atrial fibrillation 09/28/2007  . SINUSITIS- ACUTE-NOS 10/10/2007  . ALLERGIC RHINITIS 09/28/2007  . CHOLELITHIASIS 09/28/2007  . BENIGN PROSTATIC HYPERTROPHY 09/28/2007  . CELLULITIS, HAND, LEFT 09/24/2010  . SYNCOPE 07/28/2007  . INSOMNIA-SLEEP DISORDER-UNSPEC 04/03/2009  . HYPERSOMNIA WITH SLEEP APNEA UNSPECIFIED 12/26/2007  . FATIGUE 01/01/2008  . DYSPNEA 04/03/2009  . Wheezing 11/06/2009  . ANGIOEDEMA 11/06/2009  . COLON CANCER, HX OF 07/28/2007  . PROSTATE CANCER, HX OF 07/28/2007  . COLONIC POLYPS, HX OF 07/28/2007  . Long term current use of anticoagulant 12/22/2010  . Asthma   . DEMENTIA   . Spinal stenosis   . History of atrial fibrillation     Medications:  Scheduled:    . acetaminophen      . albuterol  2.5 mg Nebulization Q6H  . amLODipine  5 mg Oral Daily  . atenolol  50 mg Oral Daily  . azithromycin  500 mg Intravenous  Q24H  . buPROPion  150 mg Oral Daily  . cefTRIAXone (ROCEPHIN) IV  1 g Intravenous Q24H  . celecoxib  200 mg Oral BID  . darifenacin  7.5 mg Oral Daily  . docusate sodium  100 mg Oral BID  . donepezil  10 mg Oral Daily  . FLUoxetine  40 mg Oral Daily  . fluticasone  2 spray Nasal Daily  . Fluticasone-Salmeterol  1 puff Inhalation Daily  . furosemide  20 mg Intravenous Q12H  . guaiFENesin  1,200 mg Oral BID  . ipratropium  0.5 mg Nebulization Q6H  . l-methylfolate-b2-b6-b12  1 tablet Oral Daily  . loratadine  10 mg Oral Daily  . multivitamin  1 tablet Oral Daily  . ondansetron      . ondansetron (ZOFRAN) IV  4 mg Intravenous Q6H  . piperacillin-tazobactam (ZOSYN)  IV  3.375 g Intravenous Once  . rosuvastatin  20 mg Oral Daily  . sodium chloride  3 mL Intravenous Q12H  . vancomycin  1,000 mg Intravenous Once  . Vitamin D3  1,000 Units Oral Daily  . warfarin  5-7.5 mg Oral Daily  . DISCONTD: azithromycin  500 mg Intravenous Q24H  . DISCONTD: cefTRIAXone (ROCEPHIN) IV  1 g Intravenous Q24H  . DISCONTD: levocetirizine  5 mg Oral QPM   Assessment: 76 yo M to begin Vancomycin/Azithromycin/Rocephin for suspected PNA.  Pt reported a fever of 102.3 at home.  Current temp 99.9.  CrCl ~80.  Patient received Vancomycin  1gm IVPB ~0350 this morning in the ED.  Pt also on Coumadin PTA with admission INR  Of 1.54 (goal 2-3).  Pt on Coumadin 5mg  Tue, Thur, Sat, Sun and 7.5mg  Mon, Wed, Fri managed by [facility].  Goal of Therapy:  Vancomycin trough level 15-20 mcg/ml INR 2-3  Plan:  Vancomycin 1gm IV x 1 now (to complete total loading dose of 2gm ~20mg /kg).  Then begin maintenance regimen of Vancomycin 1gm IV Q12 hours.  Azithromycin and Rocephin doses are appropriate.  Will check Vancomycin trough at steady state if therapy continues.  Will follow up, renal function and clinical progress as well.  With regards to Coumadin, INR subtherapeutic.  Will begin with higher home dose of  7.5mg  tonight.  Daily CBC, INR.  Pharmacy will follow.   Racheal Mathurin, Judie Bonus 09/21/2011,8:51 AM

## 2011-09-21 NOTE — Progress Notes (Signed)
CSW filed complete assessment in shadow chart. Pt reports that he lives at home with wife, who is retired and able to assist him as needed. Pt reports no concerns with care or access to care at home.  Hospitalist CSW will f/u with tx team to address any potential concerns.

## 2011-09-21 NOTE — H&P (Signed)
[name] [name] is an 76 y.o. male.   Chief Complaint: Fever, rigors, nausea and vomiting HPI: Pt is a 76 yr old male with moderate to severe dementia who was found to be having violent shaking last night at home.  She checked his temperature and found it to be 102.3.  He then had emesis x1.   The patient's wife is the historian for this patient.  She states that she had noticed that the patient had had a slight cough, but had not noticed any SOB, sputum production, or fevers until last evening.  Past Medical History  Diagnosis Date  . ONYCHOMYCOSIS, TOENAILS 06/27/2008  . HYPERLIPIDEMIA 07/28/2007  . DEMENTIA 09/28/2007  . ANXIETY 09/28/2007  . DEPRESSION 07/28/2007  . OBSTRUCTIVE SLEEP APNEA 01/01/2008  . RESTLESS LEGS SYNDROME 12/26/2007  . HEARING LOSS 06/27/2008  . HYPERTENSION 07/28/2007  . MYOCARDIAL INFARCTION, HX OF 07/28/2007  . CORONARY ARTERY DISEASE 07/28/2007  . Atrial fibrillation 09/28/2007  . SINUSITIS- ACUTE-NOS 10/10/2007  . ALLERGIC RHINITIS 09/28/2007  . CHOLELITHIASIS 09/28/2007  . BENIGN PROSTATIC HYPERTROPHY 09/28/2007  . 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 CELLULITIS, HAND, LEFT 09/24/2010  . SYNCOPE 07/28/2007  . INSOMNIA-SLEEP DISORDER-UNSPEC 04/03/2009  . HYPERSOMNIA WITH SLEEP APNEA UNSPECIFIED 12/26/2007  . FATIGUE 01/01/2008  . DYSPNEA 04/03/2009  . Wheezing 11/06/2009  . ANGIOEDEMA 11/06/2009  . COLON CANCER, HX OF 07/28/2007  . PROSTATE CANCER, HX OF 07/28/2007  . COLONIC POLYPS, HX OF 07/28/2007  . Long term current use of anticoagulant 12/22/2010  . Asthma     Past Surgical History  Procedure Date  . Coronary artery bypass graft 1996    x 3  . Prostatectomy 2001  . Cardioversion 2007  . Stress cardiolite 02/08/2007  . Schocardiograph 10/07/2006  . Right thyroid lobectomy     Family History  Problem Relation Age of Onset  . Coronary artery disease Father     before 3 yo  . Coronary artery disease Sister     before 47 yo  . Hypertension Other    Social  History:  reports that he has never smoked. He does not have any smokeless tobacco history on file. He reports that he does not drink alcohol or use illicit drugs. Medications Prior to Admission  Medication Dose Route Frequency Provider Last Rate Last Dose  . acetaminophen (TYLENOL) 325 MG tablet        975 mg at 09/21/11 0252  . acetaminophen (TYLENOL) tablet 650 mg  650 mg Oral Q6H PRN Lyanne Co, MD      . azithromycin [facility]) 500 mg in dextrose 5 % 250 mL IVPB  500 mg Intravenous Q24H Lyanne Co, MD   500 mg at 09/21/11 0543  . cefTRIAXone (ROCEPHIN) 1 g in dextrose 5 % 50 mL IVPB  1 g Intravenous Q24H Lyanne Co, MD   1 g at 09/21/11 0458  . ondansetron (ZOFRAN) 4 MG/2ML injection           . ondansetron (ZOFRAN) injection 4 mg  4 mg Intravenous Q6H Lyanne Co, MD      . piperacillin-tazobactam (ZOSYN) IVPB 3.375 g  3.375 g Intravenous Once Lyanne Co, MD   3.375 g at 09/21/11 0408  . vancomycin (VANCOCIN) IVPB 1000 mg/200 mL premix  1,000 mg Intravenous Once Lyanne Co, MD   1,000 mg at 09/21/11 0350   Medications Prior to Admission  Medication Sig Dispense Refill  . albuterol (PROAIR HFA) 108 (90 BASE) MCG/ACT inhaler  Inhale 2 puffs into the lungs every 4 (four) hours as needed. For wheezing  3 Inhaler  3  . ALPRAZolam (XANAX) 0.5 MG tablet Take 0.5 mg by mouth 3 (three) times daily as needed. For anxiety       . amLODipine (NORVASC) 5 MG tablet Take 1 tablet (5 mg total) by mouth daily.  90 tablet  3  . atenolol (TENORMIN) 50 MG tablet Take 1 tablet (50 mg total) by mouth daily.  90 tablet  3  . atorvastatin (LIPITOR) 40 MG tablet Take 1 tablet (40 mg total) by mouth daily.  90 tablet  3  . buPROPion (WELLBUTRIN XL) 150 MG 24 hr tablet Take 1 tablet (150 mg total) by mouth daily.  90 tablet  3  . celecoxib (CELEBREX) 200 MG capsule Take 1 capsule (200 mg total) by mouth 2 (two) times daily.  180 capsule  3  . Cholecalciferol (VITAMIN D3) 1000 UNITS CAPS Take  by mouth daily.        . clonazePAM (KLONOPIN) 1 MG tablet Take 1 tablet (1 mg total) by mouth at bedtime as needed for anxiety.  30 tablet  5  . desloratadine (CLARINEX) 5 MG tablet Take 1 tablet (5 mg total) by mouth daily.  90 tablet  3  . docusate sodium (COLACE) 100 MG capsule Take 100 mg by mouth 2 (two) times daily.        Marland Kitchen donepezil (ARICEPT) 10 MG tablet Take 10 mg by mouth daily.        Marland Kitchen FLUoxetine (PROZAC) 40 MG capsule Take 1 capsule (40 mg total) by mouth daily.  90 capsule  3  . fluticasone (VERAMYST) 27.5 MCG/SPRAY nasal spray Place 2 sprays into the nose daily.  10 g  3  . Fluticasone-Salmeterol (ADVAIR) 250-50 MCG/DOSE AEPB Inhale 1 puff into the lungs daily.        Marland Kitchen l-methylfolate-b2-b6-b12 (CEREFOLIN) 04-08-49-5 MG TABS Take 1 tablet by mouth daily.  90 tablet  3  . levocetirizine (XYZAL) 5 MG tablet TAKE 1 TABLET EVERY EVENING  90 tablet  2  . Multiple Vitamin (MULTIVITAMINS PO) Take by mouth daily.        . solifenacin (VESICARE) 5 MG tablet Take 5 mg by mouth daily.        Marland Kitchen warfarin (COUMADIN) 5 MG tablet Take 5-7.5 mg by mouth daily. Take 5 mg on Saturday, Tuesday, Thursday and Saturday then take 7.5 mg on Monday Wednesday and Friday         Allergies:  Allergies  Allergen Reactions  . Lisinopril Swelling    Constitutional: positive for chills and fevers, negative for anorexia, malaise and weight loss Eyes: positive for redness, negative for icterus, irritation and visual disturbance Ears, nose, mouth, throat, and face: positive for facial trauma, nasal congestion and left maxillary sinus pain and congestion, negative for earaches, sore throat and voice change Respiratory: positive for cough, negative for pleurisy/chest pain, sputum and wheezing Cardiovascular: negative for chest pain, chest pressure/discomfort, dyspnea, irregular heart beat, palpitations and syncope Gastrointestinal: positive for nausea and vomiting, negative for constipation, diarrhea, dysphagia  and reflux symptoms Genitourinary:negative for dysuria, frequency and hesitancy Integument/breast: positive for skin lesion(s) and multiple fire ant bites on left leg, negative for dryness, rash and skin color change Hematologic/lymphatic: positive for easy bruising, negative for bleeding, lymphadenopathy and petechiae Musculoskeletal:positive for stiff joints, negative for back pain and muscle weakness Neurological: positive for memory problems and tremors, negative for dizziness, headaches and seizures Behavioral/Psych:  positive for anxiety, depression and sleep disturbance, negative for behavior problems, irritability and loss of interest in favorite activities Endocrine: positive for temperature intolerance, negative for diabetic symptoms including blurry vision, increased fatigue, polydipsia, polyphagia and polyuria and temperature intolerance   General appearance: appears stated age, no distress, moderately obese and slowed mentation Head: Normocephalic, without obvious abnormality, patient has an injury to his rt brown from a fall last week. Eyes: negative findings: lids and lashes normal, pupils equal, round, reactive to light and accomodation and EOMBI, positive findings: conjunctiva: 2+ injection, 2+ subconjunctival hemorrhage and sclera injected. Throat: lips, mucosa, and tongue normal; teeth and gums normal Neck: no adenopathy, no carotid bruit, no JVD, supple, symmetrical, trachea midline and thyroid not enlarged, symmetric, no tenderness/mass/nodules Resp: dullness to percussion bibasilar, rales bilaterally and increased work of breathing and tactile fremitus at the left base. Chest wall: no tenderness Cardio: regular rate and rhythm, S1, S2 normal, no murmur, click, rub or gallop and normal apical impulse GI: soft, non-tender; bowel sounds normal; no masses,  no organomegaly Extremities: edema 1+ bilaterally, Homans sign is negative, no sign of DVT and pt with multiple pupular  lesions on left leg due to fire ant bites. Pulses: 2+ and symmetric Skin: Skin color, texture, turgor normal. No rashes or lesions or with the exception of the afore mentioned ant bites. Lymph nodes: Cervical, supraclavicular, and axillary nodes normal. Neurologic: Alert and oriented X 3, normal strength and tone. Normal symmetric reflexes. Normal coordination and gait   Results for orders placed during the hospital encounter of 09/21/11 (from the past 48 hour(s))  CBC     Status: Abnormal   Collection Time   09/21/11  3:03 AM      Component Value Range Comment   WBC 9.7  4.0 - 10.5 (K/uL)    RBC 4.62  4.22 - 5.81 (MIL/uL)    Hemoglobin 14.5  13.0 - 17.0 (g/dL)    HCT 16.1  09.6 - 04.5 (%)    MCV 90.5  78.0 - 100.0 (fL)    MCH 31.4  26.0 - 34.0 (pg)    MCHC 34.7  30.0 - 36.0 (g/dL)    RDW 40.9  81.1 - 91.4 (%)    Platelets 140 (*) 150 - 400 (K/uL)   COMPREHENSIVE METABOLIC PANEL     Status: Abnormal   Collection Time   09/21/11  3:03 AM      Component Value Range Comment   Sodium 138  135 - 145 (mEq/L)    Potassium 3.5  3.5 - 5.1 (mEq/L)    Chloride 102  96 - 112 (mEq/L)    CO2 24  19 - 32 (mEq/L)    Glucose, Bld 127 (*) 70 - 99 (mg/dL)    BUN 16  6 - 23 (mg/dL)    Creatinine, Ser 7.82  0.50 - 1.35 (mg/dL)    Calcium 9.0  8.4 - 10.5 (mg/dL)    Total Protein 6.6  6.0 - 8.3 (g/dL)    Albumin 3.7  3.5 - 5.2 (g/dL)    AST 25  0 - 37 (U/L)    ALT 18  0 - 53 (U/L)    Alkaline Phosphatase 78  39 - 117 (U/L)    Total Bilirubin 1.4 (*) 0.3 - 1.2 (mg/dL)    GFR calc non Af Amer 82 (*) >90 (mL/min)    GFR calc Af Amer >90  >90 (mL/min)   TROPONIN I     Status: Normal   Collection  Time   09/21/11  3:06 AM      Component Value Range Comment   Troponin I <0.30  <0.30 (ng/mL)   PROCALCITONIN     Status: Normal   Collection Time   09/21/11  3:07 AM      Component Value Range Comment   Procalcitonin 0.75     LACTIC ACID, PLASMA     Status: Abnormal   Collection Time   09/21/11   3:07 AM      Component Value Range Comment   Lactic Acid, Venous 2.4 (*) 0.5 - 2.2 (mmol/L)   PROTIME-INR     Status: Abnormal   Collection Time   09/21/11  3:45 AM      Component Value Range Comment   Prothrombin Time 18.8 (*) 11.6 - 15.2 (seconds)    INR 1.54 (*) 0.00 - 1.49     @[id number] @  Blood pressure 125/67, pulse 89, temperature 100.1 F (37.8 C), temperature source Oral, resp. rate 28, SpO2 96.00%.    Assessment/Plan 1. Severe sepsis with respiratory failure, fever, rigors, and elevated lactic acid.  Pt will be admitted to step down status.  BP ok thus far, but we will monitor it and give pressors if necessary. 2. Respiratory failure.  Pt's Oxygen saturation is 96% on 6 L O2.  He is conversationally dyspneic with conversation or even slight effort.  Pt will receive O2 supplementation, breathing treatments and mucolytics. 3. Pneumonia with an Lt basilar infiltrate.  Pt will be given antibiotics appropriate for his severe infection.   4. Exacerbation of DiastolicDysfunction with b/l vascular congestion and rales.  Pt will be diuresed. 5. Coagulopathy due to coumadin for CVA risk reduction for Atrial Fibrillation.  S/P cardioversion.  Monitor PT/INR carefully.  Avoid fluoroquinolones to maintain a therapeutic INR. 6. CAD  Bypass in 1986, most recent LHC in 2008 showed patent grafts.   Continue home meds. 7.Dementia - Moderate to severe, but according to wife still quite functional at home. Continue home meds. 8. Code status was discussed with the wife and the patient.  The patient stated that he could not make a decision on this at this time.  He will remain a full code until such time as he makes a decision to request DNR status.   Jadi Deyarmin 09/21/2011, 5:48 AM

## 2011-09-21 NOTE — ED Notes (Signed)
[place] Saturday.  Patient now with fever.

## 2011-09-21 NOTE — Progress Notes (Signed)
Patient lives with spouse, await pt eval for further dc planning.

## 2011-09-21 NOTE — Progress Notes (Signed)
Patient seen and examined, database reviewed.  Patient admitted earlier this morning for pneumonia and sepsis. Clinically he appears to be improving. He has been afebrile since admission. He is continued on antibiotic coverage with Rocephin and azithromycin. Will continue pulmonary hygiene and continue to wean down his oxygen.  He is continued on beta blocker for atrial fibrillation as well as Coumadin for stroke prophylaxis.  Patient feels that he is generally weak and would benefit from physical therapy consult. This will be ordered.  We'll repeat labs in the morning including lactic acid, CBC and bmet.  Followup cultures. Repeat chest x-ray in the morning.  Disposition will depend on how much care he requires with physical therapy.

## 2011-09-21 NOTE — ED Notes (Signed)
Pt fell in [place] 2 days ago; lots of bite marks on left leg that look infected. Pt's family reports fever, chills, nauseam vomiting x 2.

## 2011-09-21 NOTE — ED Provider Notes (Signed)
History     CSN: [id number] Arrival date & time: 09/21/2011  2:20 AM   First MD Initiated Contact with Patient 09/21/11 0253      Chief Complaint  Patient presents with  . Emesis  . Fever    (Consider location/radiation/quality/duration/timing/severity/associated sxs/prior treatment) The history is provided by the patient and the spouse.   level V caveat because of dementia.  The majority of the history is obtained from the spouse who is fluent in the patient's past medical history.  Reports normal the last several days and then this evening developed right appears as well as in the fever 103 at home.  He became nauseated he vomited oncewas brought to the emergency department for evaluation.  In the ER he was found to have oxygen saturations of 85% nasal cannula oxygen.  No recent history of cough.  The family denies diarrhea.  The vomit was described as food and with nonbloody and nonbilious.  His prior history of dementia hyperlipidemia MI coronary artery disease atrial fibrillation on chronic Coumadin as well as sleep apnea.   Past Medical History  Diagnosis Date  . ONYCHOMYCOSIS, TOENAILS 06/27/2008  . HYPERLIPIDEMIA 07/28/2007  . DEMENTIA 09/28/2007  . ANXIETY 09/28/2007  . DEPRESSION 07/28/2007  . OBSTRUCTIVE SLEEP APNEA 01/01/2008  . RESTLESS LEGS SYNDROME 12/26/2007  . HEARING LOSS 06/27/2008  . HYPERTENSION 07/28/2007  . MYOCARDIAL INFARCTION, HX OF 07/28/2007  . CORONARY ARTERY DISEASE 07/28/2007  . Atrial fibrillation 09/28/2007  . SINUSITIS- ACUTE-NOS 10/10/2007  . ALLERGIC RHINITIS 09/28/2007  . CHOLELITHIASIS 09/28/2007  . BENIGN PROSTATIC HYPERTROPHY 09/28/2007  . 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 CELLULITIS, HAND, LEFT 09/24/2010  . SYNCOPE 07/28/2007  . INSOMNIA-SLEEP DISORDER-UNSPEC 04/03/2009  . HYPERSOMNIA WITH SLEEP APNEA UNSPECIFIED 12/26/2007  . FATIGUE 01/01/2008  . DYSPNEA 04/03/2009  . Wheezing 11/06/2009  . ANGIOEDEMA 11/06/2009  . COLON CANCER, HX OF 07/28/2007    . PROSTATE CANCER, HX OF 07/28/2007  . COLONIC POLYPS, HX OF 07/28/2007  . Long term current use of anticoagulant 12/22/2010    Past Surgical History  Procedure Date  . Coronary artery bypass graft 1996    x 3  . Prostatectomy 2001  . Cardioversion 2007  . Stress cardiolite 02/08/2007  . Schocardiograph 10/07/2006  . Right thyroid lobectomy     Family History  Problem Relation Age of Onset  . Coronary artery disease Father     before 46 yo  . Coronary artery disease Sister     before 54 yo  . Hypertension Other     History  Substance Use Topics  . Smoking status: Never Smoker   . Smokeless tobacco: Not on file  . Alcohol Use: No      Review of Systems  Unable to perform ROS Constitutional: Positive for fever.  Gastrointestinal: Positive for vomiting.    Allergies  Lisinopril  Home Medications   Current Outpatient Rx  Name Route Sig Dispense Refill  . ALBUTEROL SULFATE HFA 108 (90 BASE) MCG/ACT IN AERS Inhalation Inhale 2 puffs into the lungs every 4 (four) hours as needed. For wheezing 3 Inhaler 3  . ALPRAZOLAM 0.5 MG PO TABS Oral Take 0.5 mg by mouth 3 (three) times daily as needed. For anxiety     . AMLODIPINE BESYLATE 5 MG PO TABS Oral Take 1 tablet (5 mg total) by mouth daily. 90 tablet 3  . ATENOLOL 50 MG PO TABS Oral Take 1 tablet (50 mg total) by mouth daily. 90 tablet 3  . ATORVASTATIN  CALCIUM 40 MG PO TABS Oral Take 1 tablet (40 mg total) by mouth daily. 90 tablet 3  . BUPROPION HCL ER (XL) 150 MG PO TB24 Oral Take 1 tablet (150 mg total) by mouth daily. 90 tablet 3  . CELECOXIB 200 MG PO CAPS Oral Take 1 capsule (200 mg total) by mouth 2 (two) times daily. 180 capsule 3  . VITAMIN D3 1000 UNITS PO CAPS Oral Take by mouth daily.      Marland Kitchen CLONAZEPAM 1 MG PO TABS Oral Take 1 tablet (1 mg total) by mouth at bedtime as needed for anxiety. 30 tablet 5  . DESLORATADINE 5 MG PO TABS Oral Take 1 tablet (5 mg total) by mouth daily. 90 tablet 3  . DIPHENHYDRAMINE  HCL 25 MG PO CAPS Oral Take 25 mg by mouth every 6 (six) hours as needed. For itching     . DOCUSATE SODIUM 100 MG PO CAPS Oral Take 100 mg by mouth 2 (two) times daily.      . DONEPEZIL HCL 10 MG PO TABS Oral Take 10 mg by mouth daily.      Marland Kitchen FLUOXETINE HCL 40 MG PO CAPS Oral Take 1 capsule (40 mg total) by mouth daily. 90 capsule 3  . FLUTICASONE FUROATE 27.5 MCG/SPRAY NA SUSP Nasal Place 2 sprays into the nose daily. 10 g 3  . FLUTICASONE-SALMETEROL 250-50 MCG/DOSE IN AEPB Inhalation Inhale 1 puff into the lungs daily.      . GUAIFENESIN 400 MG PO TABS Oral Take 400 mg by mouth every 4 (four) hours.      . IBUPROFEN 200 MG PO TABS Oral Take 400 mg by mouth every 6 (six) hours as needed. For pain     . L-METHYLFOLATE-B12-B6-B2 04-08-49-5 MG PO TABS Oral Take 1 tablet by mouth daily. 90 tablet 3  . LEVOCETIRIZINE DIHYDROCHLORIDE 5 MG PO TABS  TAKE 1 TABLET EVERY EVENING 90 tablet 2  . MULTIVITAMINS PO Oral Take by mouth daily.      Marland Kitchen SOLIFENACIN SUCCINATE 5 MG PO TABS Oral Take 5 mg by mouth daily.      . WARFARIN SODIUM 5 MG PO TABS Oral Take 5-7.5 mg by mouth daily. Take 5 mg on Saturday, Tuesday, Thursday and Saturday then take 7.5 mg on Monday Wednesday and Friday       BP 125/67  Pulse 89  Temp(Src) 100.1 F (37.8 C) (Oral)  Resp 28  SpO2 96%  Physical Exam  Nursing note and vitals reviewed. Constitutional: He appears well-developed and well-nourished.  HENT:  Head: Normocephalic and atraumatic.  Eyes: EOM are normal.  Neck: Normal range of motion.  Cardiovascular: Normal rate, regular rhythm, normal heart sounds and intact distal pulses.   Pulmonary/Chest: Effort normal and breath sounds normal. No respiratory distress.  Abdominal: Soft. He exhibits no distension. There is no tenderness.  Musculoskeletal: Normal range of motion.  Neurological: He is alert.       Answers simple questions and follows commands  Skin: Skin is warm and dry.  Psychiatric: He has a normal mood and  affect. Judgment normal.    ED Course  Procedures (including critical care time)   Date: 09/21/2011  Rate: 91  Rhythm: normal sinus rhythm  QRS Axis: normal  Intervals: normal  ST/T Wave abnormalities: normal  Conduction Disutrbances:none  Narrative Interpretation:   Old EKG Reviewed: unchanged    Labs Reviewed  CBC - Abnormal; Notable for the following:    Platelets 140 (*)  All other components within normal limits  COMPREHENSIVE METABOLIC PANEL - Abnormal; Notable for the following:    Glucose, Bld 127 (*)    Total Bilirubin 1.4 (*)    GFR calc non Af Amer 82 (*)    All other components within normal limits  LACTIC ACID, PLASMA - Abnormal; Notable for the following:    Lactic Acid, Venous 2.4 (*)    All other components within normal limits  PROTIME-INR - Abnormal; Notable for the following:    Prothrombin Time 18.8 (*)    INR 1.54 (*)    All other components within normal limits  TROPONIN I  PROCALCITONIN  URINALYSIS, ROUTINE W REFLEX MICROSCOPIC  URINE CULTURE  CULTURE, BLOOD (ROUTINE X 2)  CULTURE, BLOOD (ROUTINE X 2)   Dg Chest Port 1 View  09/21/2011  *RADIOLOGY REPORT*  Clinical Data: Shortness of breath, fever; hypoxia.  PORTABLE CHEST - 1 VIEW  Comparison: Chest radiograph performed 03/04/2010  Findings: The lungs are hypoexpanded.  Patchy left basilar airspace opacification is noted, concerning for pneumonia.  Underlying mild vascular crowding and vascular congestion are noted.  There is no evidence of pleural effusion or pneumothorax.  The cardiomediastinal silhouette is mildly enlarged; the patient is status post median sternotomy.  No acute osseous abnormalities are seen.  IMPRESSION: Patchy left basilar airspace opacification, concerning for pneumonia.  Lungs hypoexpanded; underlying vascular congestion noted.  Original Report Authenticated By: Tonia Ghent, M.D.   Personally reviewed the patient's chest x-ray  1. Community acquired pneumonia        MDM  Symptoms fever and chest x-ray concerning for community-acquired pneumonia.  Rocephin and azithromycin given.  Vanc and Zosyn ordered early in the patient's ER course.  The Zosyn was canceled and the patient appears her obtained thank Rocephin and azithromycin.  He is on nasal cannula oxygen at this time.  His tachypnea is improving.  His fever is being reduced he has no hypotension or tachycardia.  There is no signs of occult sepsis as his lactate is 2.75.  We'll admit the patient to hospitalist for ongoing care.   5:00 AM Spoke with [facility]. Will be admitted to team 1. 5 liters [place].   Lyanne Co, MD 09/21/11 0500

## 2011-09-22 ENCOUNTER — Inpatient Hospital Stay (HOSPITAL_COMMUNITY): Payer: Medicare Other

## 2011-09-22 LAB — LEGIONELLA ANTIGEN, URINE

## 2011-09-22 LAB — COMPREHENSIVE METABOLIC PANEL
ALT: 21 U/L (ref 0–53)
CO2: 25 mEq/L (ref 19–32)
Calcium: 8.6 mg/dL (ref 8.4–10.5)
Chloride: 102 mEq/L (ref 96–112)
Creatinine, Ser: 0.96 mg/dL (ref 0.50–1.35)
GFR calc Af Amer: >90 mL/min (ref >90)
GFR calc non Af Amer: 80 mL/min — ABNORMAL LOW (ref >90)
Glucose, Bld: 110 mg/dL — ABNORMAL HIGH (ref 70–99)
Sodium: 136 mEq/L (ref 135–145)
Total Bilirubin: 1.6 mg/dL — ABNORMAL HIGH (ref 0.3–1.2)

## 2011-09-22 LAB — LACTIC ACID, PLASMA: Lactic Acid, Venous: 0.9 mmol/L (ref 0.5–2.2)

## 2011-09-22 LAB — CBC
HCT: 41.5 % (ref 39.0–52.0)
Hemoglobin: 13.9 g/dL (ref 13.0–17.0)
MCH: 30.8 pg (ref 26.0–34.0)
MCHC: 33.5 g/dL (ref 30.0–36.0)
RDW: 15 % (ref 11.5–15.5)

## 2011-09-22 LAB — URINE CULTURE
Colony Count: NO GROWTH
Culture  Setup Time: 201211131412

## 2011-09-22 MED ORDER — ALBUTEROL SULFATE (5 MG/ML) 0.5% IN NEBU
2.5000 mg | INHALATION_SOLUTION | Freq: Three times a day (TID) | RESPIRATORY_TRACT | Status: DC
  Administered 2011-09-22 – 2011-09-27 (×15): 2.5 mg via RESPIRATORY_TRACT
  Filled 2011-09-22 (×16): qty 0.5

## 2011-09-22 MED ORDER — WARFARIN SODIUM 10 MG PO TABS
10.0000 mg | ORAL_TABLET | Freq: Once | ORAL | Status: AC
  Administered 2011-09-22: 10 mg via ORAL
  Filled 2011-09-22: qty 1

## 2011-09-22 MED ORDER — VITAMIN D3 25 MCG (1000 UNIT) PO TABS
1000.0000 [IU] | ORAL_TABLET | Freq: Every day | ORAL | Status: DC
  Administered 2011-09-22 – 2011-09-27 (×6): 1000 [IU] via ORAL
  Filled 2011-09-22 (×6): qty 1

## 2011-09-22 MED ORDER — PATIENT'S GUIDE TO USING COUMADIN BOOK
Freq: Once | Status: AC
  Administered 2011-09-22: 14:00:00
  Filled 2011-09-22: qty 1

## 2011-09-22 MED ORDER — IPRATROPIUM BROMIDE 0.02 % IN SOLN
0.5000 mg | Freq: Three times a day (TID) | RESPIRATORY_TRACT | Status: DC
  Administered 2011-09-22 – 2011-09-27 (×15): 0.5 mg via RESPIRATORY_TRACT
  Filled 2011-09-22 (×16): qty 2.5

## 2011-09-22 MED ORDER — HYDROCORTISONE 2.5 % RE CREA
TOPICAL_CREAM | Freq: Two times a day (BID) | RECTAL | Status: DC
  Administered 2011-09-22 – 2011-09-27 (×9): via RECTAL
  Filled 2011-09-22: qty 28.35

## 2011-09-22 MED ORDER — WARFARIN VIDEO
Freq: Once | Status: DC
  Filled 2011-09-22: qty 1

## 2011-09-22 NOTE — Clinical Documentation Improvement (Signed)
RESPIRATORY FAILURE DOCUMENTATION CLARIFICATION QUERY   Please update your documentation within the medical record to reflect your response to this query.                                                                                     09/22/11  Dear Dr.Memon Marton Red[place],  In a better effort to capture your patient's severity of illness, reflect appropriate length of stay and utilization of resources, please clarify acuity of "Respiratory Failure" documented on H&P.    In responding to this query please exercise your independent judgment.  The fact that a query is asked, does not imply that any particular answer is desired or expected.  Possible Clinical Conditions?  _______Acute Respiratory Failure _______Acute on Chronic Respiratory Failure _______Chronic Respiratory Failure _______Other Condition________________ _______Cannot Clinically Determine    Supporting Information: Risk Factors: Lt base PNA/CAP, exacerbation Diastolic dysfunction, mod-severe dementia, Sepsis  Signs&Symptoms: Conversational dyspnea requiring O2 6L with Sats 96%, increased work of breathing, RR:28  Treatment: Oxygen: 6L  Respiratory Treatment: Albuterol, Atrovent               You may use possible, probable, or suspect with inpatient documentation. possible, probable, suspected diagnoses MUST be documented at the time of discharge  Reviewed:  no additional documentation provided  Thank You,  Sincerely, Beverley Fiedler RN  Clinical Documentation Specialist: Pager  [phone]  [facility] [employer] [facility] [place]

## 2011-09-22 NOTE — Progress Notes (Signed)
Pt's wife came to room and concerned about left leg being purple. I did not notice that his leg was purple or for that matter, any discoloration. Pulses good and equal in both legs, cool to touch on toes but equal with both feet. Skin intact, on rounds MD notified and didn't see any reason for concern as well. Orders to check feet and pulses each shift and I will do and pass along to following shift. Ulyess Mort, Rn

## 2011-09-22 NOTE — Progress Notes (Signed)
ANTICOAGULATION CONSULT NOTE - Follow Up Consult  Pharmacy Consult for Coumadin Indication: A.Fib  Allergies  Allergen Reactions  . Lisinopril Swelling    Patient Measurements: Height: 5\' 9"  (175.3 cm) Weight: 230 lb 6.1 oz (104.5 kg) IBW/kg (Calculated) : 70.7    Vital Signs: Temp: 99.7 F (37.6 C) (11/14 0647) Temp src: Oral (11/14 0647) BP: 136/60 mmHg (11/14 1013) Pulse Rate: 60  (11/14 0647)  Labs:  Basename 09/22/11 1105 09/22/11 0600 09/21/11 0345 09/21/11 0306 09/21/11 0303  HGB 13.9 -- -- -- 14.5  HCT 41.5 -- -- -- 41.8  PLT 129* -- -- -- 140*  APTT -- -- -- -- --  LABPROT -- 16.8* 18.8* -- --  INR -- 1.34 1.54* -- --  HEPARINUNFRC -- -- -- -- --  CREATININE -- 0.96 -- -- 0.89  CKTOTAL -- -- -- -- --  CKMB -- -- -- -- --  TROPONINI -- -- -- <0.30 --   Estimated Creatinine Clearance: 80.4 ml/min (by C-G formula based on Cr of 0.96).   Medications:  Scheduled:    . albuterol  2.5 mg Nebulization Q6H  . amLODipine  5 mg Oral Daily  . atenolol  50 mg Oral Daily  . azithromycin  500 mg Intravenous Q24H  . buPROPion  150 mg Oral Daily  . cefTRIAXone (ROCEPHIN) IV  1 g Intravenous Q24H  . celecoxib  200 mg Oral BID  . cholecalciferol  1,000 Units Oral Daily  . darifenacin  7.5 mg Oral Daily  . docusate sodium  100 mg Oral BID  . donepezil  10 mg Oral Daily  . FLUoxetine  40 mg Oral Daily  . fluticasone  2 spray Each Nare Daily  . Fluticasone-Salmeterol  1 puff Inhalation Daily  . folic acid-pyridoxine-cyancobalamin  2 tablet Oral Daily  . furosemide  20 mg Intravenous Q12H  . guaiFENesin  1,200 mg Oral BID  . hydrocortisone   Rectal BID  . ipratropium  0.5 mg Nebulization Q6H  . loratadine  10 mg Oral Daily  . multivitamin  1 tablet Oral Daily  . rosuvastatin  20 mg Oral Daily  . sodium chloride  3 mL Intravenous Q12H  . warfarin  7.5 mg Oral ONCE-1800  . DISCONTD: ondansetron (ZOFRAN) IV  4 mg Intravenous Q6H  . DISCONTD: vancomycin  1,000 mg  Intravenous Q12H  . DISCONTD: Vitamin D3  1,000 Units Oral Daily   Goal of Therapy:  INR 2-3   Assessment & Plan:  76yo male with AFib on Coumadin.  INR 1.34 this AM.  No problems noted.  1.  Coumadin 10mg  x 1 2.  F/U AM  [name] [name] P 09/22/2011,1:21 PM

## 2011-09-22 NOTE — Clinical Documentation Improvement (Signed)
CHF DOCUMENTATION CLARIFICATION QUERY  Please update your documentation within the medical record to reflect your response to this query.                                                                                     09/22/11  Dear Dr.Memon / Associates,  In a better effort to capture your patient's severity of illness, reflect appropriate length of stay and utilization of resources, a review of the patient medical record has revealed the following indicators the diagnosis of Heart Failure.    Based on your clinical judgment, please clarify and document in a progress note and/or discharge summary the clinical condition associated with the following supporting information:  In responding to this query please exercise your independent judgment.  The fact that a query is asked, does not imply that any particular answer is desired or expected.  Possible Clinical Conditions?  Acute Diastolic Congestive Heart Failure Acute on Chronic Diastolic Congestive Heart Failure Other Condition________________________________________ Cannot Clinically Determine  Supporting Information:  Risk Factors:   on H&P: "exacerbation Diastolic dysfunction" Signs & Symptoms:   conversational dyspnea Diagnostics:   BNP 197 Treatment:   Diurese  Reviewed:  no additional documentation provided  Thank You.  Sincerely, Beverley Fiedler RN Clinical Documentation Specialist: Pager  [phone]  Health Information Management [place]

## 2011-09-22 NOTE — Progress Notes (Signed)
Physical Therapy Evaluation Patient Details Name: [name] [name] MRN: [id number] DOB: May 12, 1935 Today's Date: 09/22/2011  Problem List:  Patient Active Problem List  Diagnoses  . HYPERLIPIDEMIA  . DEMENTIA  . ANXIETY  . DEPRESSION  . OBSTRUCTIVE SLEEP APNEA  . RESTLESS LEGS SYNDROME  . HEARING LOSS  . HYPERTENSION  . MYOCARDIAL INFARCTION, HX OF  . CORONARY ARTERY DISEASE  . Atrial fibrillation  . ALLERGIC RHINITIS  . ASTHMA  . CHOLELITHIASIS  . BENIGN PROSTATIC HYPERTROPHY  . ORGANIC IMPOTENCE  . SYNCOPE  . INSOMNIA-SLEEP DISORDER-UNSPEC  . HYPERSOMNIA WITH SLEEP APNEA UNSPECIFIED  . FATIGUE  . COLON CANCER, HX OF  . PROSTATE CANCER, HX OF  . COLONIC POLYPS, HX OF  . Long term current use of anticoagulant  . Preventative health care  . Lumbar radiculopathy  . Overactive bladder  . Pneumonia  . Respiratory failure  . Sepsis with acute organ dysfunction    Past Medical History:  Past Medical History  Diagnosis Date  . ONYCHOMYCOSIS, TOENAILS 06/27/2008  . HYPERLIPIDEMIA 07/28/2007  . ANXIETY 09/28/2007  . OBSTRUCTIVE SLEEP APNEA 01/01/2008  . RESTLESS LEGS SYNDROME 12/26/2007  . HEARING LOSS 06/27/2008  . HYPERTENSION 07/28/2007  . MYOCARDIAL INFARCTION, HX OF 07/28/2007  . CORONARY ARTERY DISEASE 07/28/2007  . Atrial fibrillation 09/28/2007  . SINUSITIS- ACUTE-NOS 10/10/2007  . ALLERGIC RHINITIS 09/28/2007  . CHOLELITHIASIS 09/28/2007  . BENIGN PROSTATIC HYPERTROPHY 09/28/2007  . CELLULITIS, HAND, LEFT 09/24/2010  . SYNCOPE 07/28/2007  . INSOMNIA-SLEEP DISORDER-UNSPEC 04/03/2009  . HYPERSOMNIA WITH SLEEP APNEA UNSPECIFIED 12/26/2007  . FATIGUE 01/01/2008  . DYSPNEA 04/03/2009  . Wheezing 11/06/2009  . ANGIOEDEMA 11/06/2009  . COLON CANCER, HX OF 07/28/2007  . PROSTATE CANCER, HX OF 07/28/2007  . COLONIC POLYPS, HX OF 07/28/2007  . Long term current use of anticoagulant 12/22/2010  . Asthma   . DEMENTIA   . Spinal stenosis   . History of atrial fibrillation     Past Surgical History:  Past Surgical History  Procedure Date  . Prostatectomy 2001  . Cardioversion 2007  . Stress cardiolite 02/08/2007  . Schocardiograph 10/07/2006  . Right thyroid lobectomy   . Coronary artery bypass graft 1995    x 3  . Hernia repair 1998  . Colonoscopy 2010    last done 2010- clear    PT Assessment/Plan/Recommendation PT Assessment Clinical Impression Statement: Pt is 76 y/o man with progressing dementa who per is wife is on multiple medications. Presents with intentional tremors and increased labor of movement during all transfers, bed mobility and decreased balance/gait fluidity during gait. Will benefit theapy in the acute setting to address the below problem list and would benefit ST-SNF to decrease the burden of care on his wife.  PT Recommendation/Assessment: Patient will need skilled PT in the acute care venue PT Problem List: Decreased activity tolerance;Decreased balance;Decreased mobility;Decreased coordination;Decreased cognition;Decreased knowledge of use of DME;Decreased safety awareness PT Therapy Diagnosis : Difficulty walking;Abnormality of gait;Generalized weakness PT Plan PT Frequency: Min 3X/week PT Treatment/Interventions: Gait training;Stair training;Functional mobility training;Therapeutic exercise;Balance training;Patient/family education;Cognitive remediation;Neuromuscular re-education;DME instruction PT Recommendation Recommendations for Other Services: OT consult Follow Up Recommendations: Skilled nursing facility Equipment Recommended: Defer to next venue PT Goals  Acute Rehab PT Goals PT Goal Formulation: With patient Time For Goal Achievement: 7 days Pt will Roll Supine to Right Side: with supervision PT Goal: Rolling Supine to Right Side - Progress: Progressing toward goal Pt will Roll Supine to Left Side: with supervision PT Goal: Rolling Supine to  Left Side - Progress: Progressing toward goal Pt will go Supine/Side to Sit:  with supervision PT Goal: Supine/Side to Sit - Progress: Progressing toward goal Pt will go Sit to Supine/Side: with supervision PT Goal: Sit to Supine/Side - Progress: Progressing toward goal Pt will Transfer Sit to Stand/Stand to Sit: with supervision PT Transfer Goal: Sit to Stand/Stand to Sit - Progress: Progressing toward goal Pt will Transfer Bed to Chair/Chair to Bed: with supervision PT Transfer Goal: Bed to Chair/Chair to Bed - Progress: Progressing toward goal Pt will Ambulate: >150 feet;with supervision;with least restrictive assistive device PT Goal: Ambulate - Progress: Progressing toward goal  PT Evaluation Precautions/Restrictions  Precautions Precautions: Fall Prior Functioning  Home Living Lives With: Spouse Receives Help From: Family Type of Home: House Home Layout: One level Home Access: Stairs to enter Secretary/administrator of Steps: 2 Bathroom Shower/Tub: Engineer, manufacturing systems: Pharmacist, community: No Home Adaptive Equipment: Paediatric nurse with back Prior Function Level of Independence: Needs assistance with tranfers Driving: No Vocation: Unemployed Financial risk analyst Arousal/Alertness: Awake/alert Overall Cognitive Status: History of cognitive impairments History of Cognitive Impairment: Appears at baseline functioning Orientation Level: Disoriented to situation Sensation/Coordination Sensation Light Touch: Appears Intact Coordination Gross Motor Movements are Fluid and Coordinated: No Coordination and Movement Description: pt very tremulous with all movement Extremity Assessment RUE Assessment RUE Assessment: Not tested RUE Strength RUE Overall Strength Comments: pt with tremulous movments difficulty using arms to scoot himself back on bed LUE Strength LUE Overall Strength Comments: same as RUE RLE Assessment RLE Assessment: Within Functional Limits LLE Assessment LLE Assessment: Within Functional Limits Mobility  (including Balance) Bed Mobility Bed Mobility: Yes Supine to Sit: 3: Mod assist Supine to Sit Details (indicate cue type and reason): wife assisting by pulling on pt's arm, I also assisted with use of pad to assist with scooting hips EOB Sit to Supine - Right: 1: +2 Total assist Sit to Supine - Right Details (indicate cue type and reason): 50%; pt with poor ability to scoot back on bed and lift legs (wife normally helps him with this at home) Transfers Transfers: Yes Sit to Stand: 1: +2 Total assist Sit to Stand Details (indicate cue type and reason): 60-70%; initially standing from EOB pt stood with +2total pt 50% with max cueing for safe sequencing, prep to stand and anterior translation and follow through; second stand from chair with max v/c's pt able to stand +2totalpt65% with better ability to propell self out of seat Stand to Sit: 2: Max assist Stand to Sit Details: pt not flexing, just extending back into chair with maxA to control descent Ambulation/Gait Ambulation/Gait: Yes Ambulation/Gait Assistance: 4: Min assist Ambulation/Gait Assistance Details (indicate cue type and reason): pt needing initial modA to sequence RW; did well once moving but needed occassional corrections (keeping RW close)  Ambulation Distance (Feet): 100 Feet Assistive device: Rolling walker Gait Pattern: Trunk flexed;Decreased stride length;Decreased hip/knee flexion - left;Decreased dorsiflexion - right;Decreased dorsiflexion - left;Decreased step length - right;Decreased step length - left;Decreased hip/knee flexion - right  Posture/Postural Control Posture/Postural Control: Postural limitations Postural Limitations: pt with baseline lumbar stenosis and decrease trunk control (flexing forward during ambulation) Sitting Balance: Poor, pt sitting EOB with feet off the floor and significant lean to right without awareness and needing modA to correct this   End of Session PT - End of Session Equipment  Utilized During Treatment: Gait belt Activity Tolerance: Patient limited by fatigue Patient left: in bed;with bed alarm set;with call bell in  reach Nurse Communication: Mobility status for transfers;Mobility status for ambulation (recommendation of ST-SNF) General Behavior During Session: [facility] for tasks performed Cognition: Impaired, at baseline  [facility] HELEN 09/22/2011, 3:38 PM

## 2011-09-22 NOTE — Progress Notes (Signed)
Pt had a bowel movement with bright red blood in it and pt does have bleeding in his underwear at home per his wife from hemmroids but she felt that this could be another problem. MD notified and orders received for hemocult and sent to lab. Will continue to monitor. Ulyess Mort, RN

## 2011-09-22 NOTE — Progress Notes (Signed)
Subjective: Patient seen and examined. Feeling much better, shortness of breath improving, denies any cough. Wife at bedside stated that she noticed some discoloration in his left foot this morning apparently resolved now.  Objective: Vital signs in last 24 hours: Temp:  [99.5 F (37.5 C)-99.8 F (37.7 C)] 99.7 F (37.6 C) (11/14 0647) Pulse Rate:  [54-60] 60  (11/14 0647) Resp:  [18-20] 20  (11/14 0647) BP: (107-136)/(54-73) 136/60 mmHg (11/14 1013) SpO2:  [89 %-100 %] 93 % (11/14 0942) Weight change:  Last BM Date: 09/18/11  Intake/Output from previous day: 11/13 0701 - 11/14 0700 In: 480 [P.O.:480] Out: 1300 [Urine:1300] Total I/O In: -  Out: 301 [Urine:300; Stool:1]   Physical Exam: General: Alert, awake, in no acute distress. Heart: Regular rate and rhythm, without murmurs, rubs, gallops. Lungs: Clear to auscultation bilaterally. Abdomen: Soft, nontender, nondistended, positive bowel sounds. Extremities: Normal skin color noted. No cyanosis or edema with positive pedal pulses bilaterally, mildly cold at the toes bilaterally the rest of the feet is warm.. Neuro: Grossly intact, nonfocal.    Lab Results: Results for orders placed during the hospital encounter of 09/21/11 (from the past 24 hour(s))  URINALYSIS, ROUTINE W REFLEX MICROSCOPIC     Status: Normal   Collection Time   09/21/11 12:32 PM      Component Value Range   Color, Urine YELLOW  YELLOW    Appearance CLEAR  CLEAR    Specific Gravity, Urine 1.030  1.005 - 1.030    pH 6.0  5.0 - 8.0    Glucose, UA NEGATIVE  NEGATIVE (mg/dL)   Hgb urine dipstick NEGATIVE  NEGATIVE    Bilirubin Urine NEGATIVE  NEGATIVE    Ketones, ur NEGATIVE  NEGATIVE (mg/dL)   Protein, ur NEGATIVE  NEGATIVE (mg/dL)   Urobilinogen, UA 0.2  0.0 - 1.0 (mg/dL)   Nitrite NEGATIVE  NEGATIVE    Leukocytes, UA NEGATIVE  NEGATIVE   STREP PNEUMONIAE URINARY ANTIGEN     Status: Normal   Collection Time   09/21/11 12:32 PM      Component  Value Range   Strep Pneumo Urinary Antigen NEGATIVE  NEGATIVE   COMPREHENSIVE METABOLIC PANEL     Status: Abnormal   Collection Time   09/22/11  6:00 AM      Component Value Range   Sodium 136  135 - 145 (mEq/L)   Potassium 3.9  3.5 - 5.1 (mEq/L)   Chloride 102  96 - 112 (mEq/L)   CO2 25  19 - 32 (mEq/L)   Glucose, Bld 110 (*) 70 - 99 (mg/dL)   BUN 19  6 - 23 (mg/dL)   Creatinine, Ser 0.45  0.50 - 1.35 (mg/dL)   Calcium 8.6  8.4 - 40.9 (mg/dL)   Total Protein 6.4  6.0 - 8.3 (g/dL)   Albumin 3.2 (*) 3.5 - 5.2 (g/dL)   AST 54 (*) 0 - 37 (U/L)   ALT 21  0 - 53 (U/L)   Alkaline Phosphatase 60  39 - 117 (U/L)   Total Bilirubin 1.6 (*) 0.3 - 1.2 (mg/dL)   GFR calc non Af Amer 80 (*) >90 (mL/min)   GFR calc Af Amer >90  >90 (mL/min)  PROTIME-INR     Status: Abnormal   Collection Time   09/22/11  6:00 AM      Component Value Range   Prothrombin Time 16.8 (*) 11.6 - 15.2 (seconds)   INR 1.34  0.00 - 1.49   LACTIC ACID, PLASMA  Status: Normal   Collection Time   09/22/11  6:00 AM      Component Value Range   Lactic Acid, Venous 0.9  0.5 - 2.2 (mmol/L)  CBC     Status: Abnormal   Collection Time   09/22/11 11:05 AM      Component Value Range   WBC 6.9  4.0 - 10.5 (K/uL)   RBC 4.52  4.22 - 5.81 (MIL/uL)   Hemoglobin 13.9  13.0 - 17.0 (g/dL)   HCT 10.2  72.5 - 36.6 (%)   MCV 91.8  78.0 - 100.0 (fL)   MCH 30.8  26.0 - 34.0 (pg)   MCHC 33.5  30.0 - 36.0 (g/dL)   RDW 44.0  34.7 - 42.5 (%)   Platelets 129 (*) 150 - 400 (K/uL)    Recent Results (from the past 240 hour(s))  CULTURE, BLOOD (ROUTINE X 2)     Status: Normal (Preliminary result)   Collection Time   09/21/11  3:15 AM      Component Value Range Status Comment   Specimen Description BLOOD RIGHT ARM   Final    Special Requests BOTTLES DRAWN AEROBIC AND ANAEROBIC 10CC EACH   Final    Setup Time [id number]   Final    Culture     Final    Value:        BLOOD CULTURE RECEIVED NO GROWTH TO DATE CULTURE WILL BE HELD FOR  5 DAYS BEFORE ISSUING A FINAL NEGATIVE REPORT   Report Status PENDING   Incomplete   CULTURE, BLOOD (ROUTINE X 2)     Status: Normal (Preliminary result)   Collection Time   09/21/11  3:25 AM      Component Value Range Status Comment   Specimen Description BLOOD RIGHT HAND   Final    Special Requests BOTTLES DRAWN AEROBIC ONLY 10CC   Final    Setup Time [id number]   Final    Culture     Final    Value:        BLOOD CULTURE RECEIVED NO GROWTH TO DATE CULTURE WILL BE HELD FOR 5 DAYS BEFORE ISSUING A FINAL NEGATIVE REPORT   Report Status PENDING   Incomplete     Studies/Results:   Medications:    . albuterol  2.5 mg Nebulization Q6H  . amLODipine  5 mg Oral Daily  . atenolol  50 mg Oral Daily  . azithromycin  500 mg Intravenous Q24H  . buPROPion  150 mg Oral Daily  . cefTRIAXone (ROCEPHIN) IV  1 g Intravenous Q24H  . celecoxib  200 mg Oral BID  . cholecalciferol  1,000 Units Oral Daily  . darifenacin  7.5 mg Oral Daily  . docusate sodium  100 mg Oral BID  . donepezil  10 mg Oral Daily  . FLUoxetine  40 mg Oral Daily  . fluticasone  2 spray Each Nare Daily  . Fluticasone-Salmeterol  1 puff Inhalation Daily  . folic acid-pyridoxine-cyancobalamin  2 tablet Oral Daily  . furosemide  20 mg Intravenous Q12H  . guaiFENesin  1,200 mg Oral BID  . ipratropium  0.5 mg Nebulization Q6H  . loratadine  10 mg Oral Daily  . multivitamin  1 tablet Oral Daily  . rosuvastatin  20 mg Oral Daily  . sodium chloride  3 mL Intravenous Q12H  . vancomycin  1,000 mg Intravenous Once  . vancomycin  1,000 mg Intravenous Q12H  . warfarin  7.5 mg Oral ONCE-1800  . DISCONTD: ondansetron (ZOFRAN)  IV  4 mg Intravenous Q6H  . DISCONTD: Vitamin D3  1,000 Units Oral Daily    acetaminophen, albuterol, ALPRAZolam, clonazePAM, diphenhydrAMINE, ibuprofen, ondansetron (ZOFRAN) IV, sodium chloride, DISCONTD: ondansetron (ZOFRAN) IV     Assessment/Plan:  Principal Problem:  *Respiratory failure Improved,  oxygen requirement is decreasing. Community acquired pneumonia. Continue with Rocephin and Zithromax, discontinue vancomycin.  Atrial fibrillation Rate controlled on atenolol, continue Coumadin as per pharmacy.  DEMENTIA Mental status much improved as per wife  was possibly exacerbated by pneumonia. Continue Aricept. Questionable left foot discoloration  Exam was completely  unremarkable. Also discussed with RN who did not notice any change in his feet color or pulse. We would continue serial feet exams for skin color and pulse and continue to monitor. Discharge planning Pending PT evaluation.    LOS: 1 day   Merisa Julio 09/22/2011, 11:30 AM

## 2011-09-23 ENCOUNTER — Encounter: Payer: Medicare Other | Admitting: *Deleted

## 2011-09-23 LAB — PROTIME-INR: Prothrombin Time: 19.1 seconds — ABNORMAL HIGH (ref 11.6–15.2)

## 2011-09-23 LAB — BASIC METABOLIC PANEL
BUN: 18 mg/dL (ref 6–23)
CO2: 32 mEq/L (ref 19–32)
Calcium: 8.5 mg/dL (ref 8.4–10.5)
Creatinine, Ser: 0.89 mg/dL (ref 0.50–1.35)
GFR calc non Af Amer: 82 mL/min — ABNORMAL LOW (ref >90)
Glucose, Bld: 103 mg/dL — ABNORMAL HIGH (ref 70–99)

## 2011-09-23 LAB — CBC
HCT: 38.8 % — ABNORMAL LOW (ref 39.0–52.0)
Hemoglobin: 12.9 g/dL — ABNORMAL LOW (ref 13.0–17.0)
MCH: 30.6 pg (ref 26.0–34.0)
MCHC: 33.2 g/dL (ref 30.0–36.0)
MCV: 91.9 fL (ref 78.0–100.0)
RBC: 4.22 MIL/uL (ref 4.22–5.81)

## 2011-09-23 LAB — GLUCOSE, CAPILLARY

## 2011-09-23 MED ORDER — WARFARIN SODIUM 7.5 MG PO TABS
7.5000 mg | ORAL_TABLET | Freq: Once | ORAL | Status: AC
  Administered 2011-09-23: 7.5 mg via ORAL
  Filled 2011-09-23: qty 1

## 2011-09-23 MED ORDER — MOXIFLOXACIN HCL 400 MG PO TABS
400.0000 mg | ORAL_TABLET | Freq: Every day | ORAL | Status: DC
  Administered 2011-09-23 – 2011-09-26 (×4): 400 mg via ORAL
  Filled 2011-09-23 (×5): qty 1

## 2011-09-23 NOTE — Progress Notes (Signed)
Clinical Social Work, 09/23/11, 1600:  CSW spoke with patient and wife multiple times throughout the day.  Wife was indecisive about patient discharging home vs discharging to a SNF.  CSW answered multiple questions and provided support.  At 1600 patient's wife stated that they are agreeable to CSW faxing out ST-SNF search.  CSW will complete FL2, fax out search, and follow up with bed offers.[name] [name], [place], [place], [phone]

## 2011-09-23 NOTE — Progress Notes (Signed)
ANTICOAGULATION CONSULT NOTE - Follow Up Consult  Pharmacy Consult for Coumadin Indication:   Allergies  Allergen Reactions  . Lisinopril Swelling    Patient Measurements: Height: 5\' 9"  (175.3 cm) Weight: 230 lb 6.1 oz (104.5 kg) IBW/kg (Calculated) : 70.7    Vital Signs: Temp: 98.7 F (37.1 C) (11/15 0500) Temp src: Oral (11/15 0500) BP: 147/74 mmHg (11/15 1013) Pulse Rate: 65  (11/15 0500)  Labs:  Basename 09/23/11 0545 09/22/11 1105 09/22/11 0600 09/21/11 0345 09/21/11 0306 09/21/11 0303  HGB 12.9* 13.9 -- -- -- --  HCT 38.8* 41.5 -- -- -- 41.8  PLT 140* 129* -- -- -- 140*  APTT -- -- -- -- -- --  LABPROT 19.1* -- 16.8* 18.8* -- --  INR 1.57* -- 1.34 1.54* -- --  HEPARINUNFRC -- -- -- -- -- --  CREATININE 0.89 -- 0.96 -- -- 0.89  CKTOTAL -- -- -- -- -- --  CKMB -- -- -- -- -- --  TROPONINI -- -- -- -- <0.30 --   Estimated Creatinine Clearance: 86.7 ml/min (by C-G formula based on Cr of 0.89).   Medications:  Scheduled:    . albuterol  2.5 mg Nebulization TID  . amLODipine  5 mg Oral Daily  . atenolol  50 mg Oral Daily  . azithromycin  500 mg Intravenous Q24H  . buPROPion  150 mg Oral Daily  . cefTRIAXone (ROCEPHIN) IV  1 g Intravenous Q24H  . celecoxib  200 mg Oral BID  . cholecalciferol  1,000 Units Oral Daily  . darifenacin  7.5 mg Oral Daily  . docusate sodium  100 mg Oral BID  . donepezil  10 mg Oral Daily  . FLUoxetine  40 mg Oral Daily  . fluticasone  2 spray Each Nare Daily  . Fluticasone-Salmeterol  1 puff Inhalation Daily  . folic acid-pyridoxine-cyancobalamin  2 tablet Oral Daily  . furosemide  20 mg Intravenous Q12H  . guaiFENesin  1,200 mg Oral BID  . hydrocortisone   Rectal BID  . ipratropium  0.5 mg Nebulization TID  . loratadine  10 mg Oral Daily  . multivitamin  1 tablet Oral Daily  . patient's guide to using coumadin book   Does not apply Once  . rosuvastatin  20 mg Oral Daily  . sodium chloride  3 mL Intravenous Q12H  . warfarin   10 mg Oral ONCE-1800  . warfarin   Does not apply Once  . DISCONTD: albuterol  2.5 mg Nebulization Q6H  . DISCONTD: ipratropium  0.5 mg Nebulization Q6H  . DISCONTD: vancomycin  1,000 mg Intravenous Q12H  . DISCONTD: Vitamin D3  1,000 Units Oral Daily   Goal of Therapy:  INR 2-3   Assessment & Plan:  76yo male with AFib, on Coumadin 7.5mg  MWF & 5mg  TTSS prior to admit.  INR 1.57 this AM- moving in right direction.  On day #3 of antibiotics for CAP.  No problems noted.  1.  Coumadin 7.5mg  today 2.  F/U am  Marisue Humble P 09/23/2011,10:52 AM

## 2011-09-23 NOTE — Progress Notes (Signed)
Subjective: Patient seen and examined. Denies any cough or fever. Reported significant improvement in his breathing.  Objective: Vital signs in last 24 hours: Temp:  [97.9 F (36.6 C)-98.7 F (37.1 C)] 98.1 F (36.7 C) (11/15 1359) Pulse Rate:  [61-65] 61  (11/15 1359) Resp:  [18-20] 20  (11/15 1359) BP: (127-147)/(67-82) 127/75 mmHg (11/15 1359) SpO2:  [94 %-98 %] 94 % (11/15 1359) Weight change:  Last BM Date: 09/22/11  Intake/Output from previous day: 11/14 0701 - 11/15 0700 In: 720 [P.O.:720] Out: 2526 [Urine:2525; Stool:1] Total I/O In: -  Out: 450 [Urine:450]   Physical Exam: General: Alert, awake, in no acute distress.  Heart: Regular rate and rhythm, without murmurs, rubs, gallops.  Lungs: Clear to auscultation bilaterally.  Abdomen: Soft, nontender, nondistended, positive bowel sounds.  Extremities: Normal skin color. No cyanosis or edema with positive pedal pulses bilaterally, warm to touch bilaterally. Neuro: Grossly intact, nonfocal.     Lab Results: Results for orders placed during the hospital encounter of 09/21/11 (from the past 24 hour(s))  PROTIME-INR     Status: Abnormal   Collection Time   09/23/11  5:45 AM      Component Value Range   Prothrombin Time 19.1 (*) 11.6 - 15.2 (seconds)   INR 1.57 (*) 0.00 - 1.49   CBC     Status: Abnormal   Collection Time   09/23/11  5:45 AM      Component Value Range   WBC 6.4  4.0 - 10.5 (K/uL)   RBC 4.22  4.22 - 5.81 (MIL/uL)   Hemoglobin 12.9 (*) 13.0 - 17.0 (g/dL)   HCT 04.5 (*) 40.9 - 52.0 (%)   MCV 91.9  78.0 - 100.0 (fL)   MCH 30.6  26.0 - 34.0 (pg)   MCHC 33.2  30.0 - 36.0 (g/dL)   RDW 81.1  91.4 - 78.2 (%)   Platelets 140 (*) 150 - 400 (K/uL)  BASIC METABOLIC PANEL     Status: Abnormal   Collection Time   09/23/11  5:45 AM      Component Value Range   Sodium 140  135 - 145 (mEq/L)   Potassium 3.8  3.5 - 5.1 (mEq/L)   Chloride 100  96 - 112 (mEq/L)   CO2 32  19 - 32 (mEq/L)   Glucose, Bld 103  (*) 70 - 99 (mg/dL)   BUN 18  6 - 23 (mg/dL)   Creatinine, Ser 9.56  0.50 - 1.35 (mg/dL)   Calcium 8.5  8.4 - 21.3 (mg/dL)   GFR calc non Af Amer 82 (*) >90 (mL/min)   GFR calc Af Amer >90  >90 (mL/min)    Studies/Results:  Medicatios:    . albuterol  2.5 mg Nebulization TID  . amLODipine  5 mg Oral Daily  . atenolol  50 mg Oral Daily  . azithromycin  500 mg Intravenous Q24H  . buPROPion  150 mg Oral Daily  . cefTRIAXone (ROCEPHIN) IV  1 g Intravenous Q24H  . celecoxib  200 mg Oral BID  . cholecalciferol  1,000 Units Oral Daily  . darifenacin  7.5 mg Oral Daily  . docusate sodium  100 mg Oral BID  . donepezil  10 mg Oral Daily  . FLUoxetine  40 mg Oral Daily  . fluticasone  2 spray Each Nare Daily  . Fluticasone-Salmeterol  1 puff Inhalation Daily  . folic acid-pyridoxine-cyancobalamin  2 tablet Oral Daily  . furosemide  20 mg Intravenous Q12H  . guaiFENesin  1,200 mg Oral BID  . hydrocortisone   Rectal BID  . ipratropium  0.5 mg Nebulization TID  . loratadine  10 mg Oral Daily  . multivitamin  1 tablet Oral Daily  . rosuvastatin  20 mg Oral Daily  . sodium chloride  3 mL Intravenous Q12H  . warfarin  10 mg Oral ONCE-1800  . warfarin  7.5 mg Oral ONCE-1800  . warfarin   Does not apply Once  . DISCONTD: albuterol  2.5 mg Nebulization Q6H  . DISCONTD: ipratropium  0.5 mg Nebulization Q6H    acetaminophen, albuterol, ALPRAZolam, clonazePAM, diphenhydrAMINE, ibuprofen, ondansetron (ZOFRAN) IV, sodium chloride     Assessment/Plan: Community acquired pneumonia.  Switch Rocephin and Zithromax   To by mouth Avelox,  Titrate O2 down as tolerated, currently saturating 94% on room air. Atrial fibrillation  Rate controlled on atenolol, continue Coumadin as per pharmacy.  DEMENTIA  Mental status at baseline Continue Aricept.  Questionable left foot discoloration  Patient had serial lower extremities exams with no concerning findings by me or staff. Exam was completely  unremarkable.  Transient hemorrhoidal bleed Resolved, Anusol ointment ordered. Stool for occult blood was checked and it was negative. As per wife it has been a chronic problem. Discharge planning  PT recommending short-term rehabilitation, social worker was informed.    LOS: 2 days   Kayonna Lawniczak 09/23/2011, 2:06 PM

## 2011-09-24 ENCOUNTER — Encounter: Payer: Medicare Other | Admitting: *Deleted

## 2011-09-24 DIAGNOSIS — K644 Residual hemorrhoidal skin tags: Secondary | ICD-10-CM

## 2011-09-24 LAB — PROTIME-INR: Prothrombin Time: 24.7 seconds — ABNORMAL HIGH (ref 11.6–15.2)

## 2011-09-24 MED ORDER — SENNA 8.6 MG PO TABS
2.0000 | ORAL_TABLET | Freq: Every day | ORAL | Status: DC | PRN
  Filled 2011-09-24: qty 2

## 2011-09-24 MED ORDER — THERA M PLUS PO TABS
1.0000 | ORAL_TABLET | Freq: Every day | ORAL | Status: DC
  Administered 2011-09-24 – 2011-09-27 (×4): 1 via ORAL
  Filled 2011-09-24 (×4): qty 1

## 2011-09-24 MED ORDER — POLYETHYLENE GLYCOL 3350 17 G PO PACK
17.0000 g | PACK | Freq: Every day | ORAL | Status: DC | PRN
  Filled 2011-09-24: qty 1

## 2011-09-24 MED ORDER — WARFARIN SODIUM 5 MG PO TABS
5.0000 mg | ORAL_TABLET | Freq: Once | ORAL | Status: AC
  Administered 2011-09-24: 5 mg via ORAL
  Filled 2011-09-24: qty 1

## 2011-09-24 NOTE — Progress Notes (Signed)
Clinical Social Worker continuing to follow for SNF placement. Pt was provided bed offers and chose bed at [facility]. Per MD, pt not yet medically ready for D/C because pt requesting consult from surgery. Clinical Social Worker contacted [facility] who stated that they could not accept pt over the weekend, but could admit pt on Monday 11/19. Clinical Social Worker notified MD and family. Clinical Social Worker to facilitate pt D/C needs on Monday 11/19.  Jacklynn Lewis, MSW, LCSWA  Clinical Social Work [phone]

## 2011-09-24 NOTE — Progress Notes (Signed)
Physical Therapy Treatment Patient Details Name: [name] [name] MRN: [id number] DOB: October 07, 1935 Today's Date: 09/24/2011  PT Assessment/Plan  PT - Assessment/Plan Comments on Treatment Session: Pt did well today. Still requires increased assist to rise from lower surface and for bed mobility. Family agreeable to ST-SNF and awaiting placement. Ed. wife on HEP of stretching heel cords and body mechanics for assisting husband at home.  PT Plan: Discharge plan remains appropriate PT Goals  Acute Rehab PT Goals PT Goal: Rolling Supine to Right Side - Progress: Progressing toward goal PT Goal: Rolling Supine to Left Side - Progress:  (not addressed) PT Goal: Supine/Side to Sit - Progress: Progressing toward goal PT Goal: Sit to Supine/Side - Progress: Progressing toward goal PT Transfer Goal: Sit to Stand/Stand to Sit - Progress: Progressing toward goal PT Transfer Goal: Bed to Chair/Chair to Bed - Progress: Progressing toward goal PT Goal: Ambulate - Progress: Progressing toward goal  PT Treatment Precautions/Restrictions  Precautions Precautions: Fall Restrictions Weight Bearing Restrictions: No Mobility (including Balance) Bed Mobility Bed Mobility: Yes Rolling Right: 4: Min assist;With rail Rolling Right Details (indicate cue type and reason): pt requires very specific verbal and tactile cueing for sequencing roll secondary to dementia (wife talking through the session)  Right Sidelying to Sit: 3: Mod assist;HOB flat Right Sidelying to Sit Details (indicate cue type and reason): pt able to initiate sidelying->sit but gets stuck about halfway with elbow on bed and legs in the air; specific cueing especially manual facilitation for trunk flexion and weight bearing into left hip to bring self up and extend through LUE; once sitting pt again needing manual facilitation to flex at hips/trunk and to bring feet down to the ground as they tend to stay up  Transfers Sit to Stand: 4: Min  assist;From elevated surface;From chair/3-in-1;With armrests;From bed Sit to Stand Details (indicate cue type and reason): pt requiring set up cues in prep to stand; cueing for safe hand placement; intiates sit->stand from elevated bed with assist to push from bed; cues for anterior translation and follow through Stand to Sit: 3: Mod assist Stand to Sit Details: poor control even with hands on armrests Ambulation/Gait Ambulation/Gait Assistance: 4: Min assist Ambulation/Gait Assistance Details (indicate cue type and reason): pt with very shuffled gait; mod v/cs and demonstrative cueing for exagerated step height and heel/toe progression; had pt perform standing marching with knee extension into heel strike; cueing for attention to task as environment very busy and pt easily distracted; continuous v/'cs for heel/toe during gait Ambulation Distance (Feet): 260 Feet Assistive device: Rolling walker Gait Pattern: Trunk flexed;Shuffle;Decreased step length - left;Decreased step length - right;Decreased hip/knee flexion - left;Decreased hip/knee flexion - right;Decreased dorsiflexion - right;Decreased dorsiflexion - left (dragging of right foot more prominent that left)    Exercise  Total Joint Exercises Ankle Circles/Pumps: PROM;Both;5 reps;Seated (passive stretching for heel cords) End of Session PT - End of Session Equipment Utilized During Treatment: Gait belt Activity Tolerance: Patient tolerated treatment well Patient left: in chair Nurse Communication: Mobility status for transfers;Mobility status for ambulation General Behavior During Session: [facility] for tasks performed Cognition: Impaired, at baseline  [facility] HELEN 09/24/2011, 12:42 PM

## 2011-09-24 NOTE — Progress Notes (Signed)
Utilization review complete 

## 2011-09-24 NOTE — Consult Note (Signed)
Reason for Consult: Hemorrhoids Referring Physician: Cleotis Lema   HPI: [name] [name] is an 76 y.o. male with multiple medical hx admitted for resp issues and pneumonia. On chronic Coumadin for a.fib and has history of hemorrhoids. No prior hems surgery. He stays fairly constipated despite Colace, though he states he's actually been looser than normal while here. He had some BRBPR earlier and he and his wife are concerned that something may urgently need to be done. He's had bleeding in the past though it stops on it's own and he has never become severely anemic from it. He's had colonoscopy ion 2010 with no sig findings to be of concern for the source and he's had 2 stools neg for heme. They are requesting surgery eval while inpt.  Past Medical History:  Past Medical History  Diagnosis Date  . ONYCHOMYCOSIS, TOENAILS 06/27/2008  . HYPERLIPIDEMIA 07/28/2007  . ANXIETY 09/28/2007  . OBSTRUCTIVE SLEEP APNEA 01/01/2008  . RESTLESS LEGS SYNDROME 12/26/2007  . HEARING LOSS 06/27/2008  . HYPERTENSION 07/28/2007  . MYOCARDIAL INFARCTION, HX OF 07/28/2007  . CORONARY ARTERY DISEASE 07/28/2007  . Atrial fibrillation 09/28/2007  . SINUSITIS- ACUTE-NOS 10/10/2007  . ALLERGIC RHINITIS 09/28/2007  . CHOLELITHIASIS 09/28/2007  . BENIGN PROSTATIC HYPERTROPHY 09/28/2007  . CELLULITIS, HAND, LEFT 09/24/2010  . SYNCOPE 07/28/2007  . INSOMNIA-SLEEP DISORDER-UNSPEC 04/03/2009  . HYPERSOMNIA WITH SLEEP APNEA UNSPECIFIED 12/26/2007  . FATIGUE 01/01/2008  . DYSPNEA 04/03/2009  . Wheezing 11/06/2009  . ANGIOEDEMA 11/06/2009  . COLON CANCER, HX OF 07/28/2007  . PROSTATE CANCER, HX OF 07/28/2007  . COLONIC POLYPS, HX OF 07/28/2007  . Long term current use of anticoagulant 12/22/2010  . Asthma   . DEMENTIA   . Spinal stenosis   . History of atrial fibrillation     Surgical History:  Past Surgical History  Procedure Date  . Prostatectomy 2001  . Cardioversion 2007  . Stress cardiolite 02/08/2007  . Schocardiograph  10/07/2006  . Right thyroid lobectomy   . Coronary artery bypass graft 1995    x 3  . Hernia repair 1998  . Colonoscopy 2010    last done 2010- clear    Family History:  Family History  Problem Relation Age of Onset  . Coronary artery disease Father     before 93 yo  . Coronary artery disease Sister     before 77 yo  . Hypertension Other     Social History:  reports that he has never smoked. He does not have any smokeless tobacco history on file. He reports that he does not drink alcohol or use illicit drugs.  Allergies:  Allergies  Allergen Reactions  . Lisinopril Swelling    Medications: I have reviewed the patient's current medications.  ROS: See HPI for pertinent findings, otherwise complete 10 system review negative.  Physical Exam: Blood pressure 128/80, pulse 93, temperature 98.4 F (36.9 C), temperature source Oral, resp. rate 20, height 5\' 9"  (1.753 m), weight 230 lb 6.1 oz (104.5 kg), SpO2 93.00%. Rectal: No erythema or fresh blood seen on external observation. 1cm soft external hem at 9 o'clock position. NT. Sphincter tone intact, small int hem palpable, NT. No blood found. No fissures seen Labs: CBC  Basename 09/23/11 0545 09/22/11 1105  WBC 6.4 6.9  HGB 12.9* 13.9  HCT 38.8* 41.5  PLT 140* 129*   MET  Basename 09/23/11 0545 09/22/11 0600  NA 140 136  K 3.8 3.9  CL 100 102  CO2 32 25  GLUCOSE 103* 110*  BUN 18 19  CREATININE 0.89 0.96  CALCIUM 8.5 8.6    Basename 09/22/11 0600  PROT 6.4  ALBUMIN 3.2*  AST 54*  ALT 21  ALKPHOS 60  BILITOT 1.6*  BILIDIR --  IBILI --  LIPASE --   PT/INR  Basename 09/24/11 0650 09/23/11 0545  LABPROT 24.7* 19.1*  INR 2.19* 1.57*   ABG No results found for this basename: PHART:2,PCO2:2,PO2:2,HCO3:2 in the last 72 hours    No results found.  Assessment/Plan: 1.Hemorrhoids secondary to chronic constipation 2.Intermittent bleeding from hems secondary to chronic Coumadin use. No need for any acute  surgical intervention. Needs to improve constipation tx, increase water, fiber intake, stool softeners. D/W pt and wife that he needs to f/u in office where appropriate exam/anascopy can be performed. They are agreeable and will follow up once recovered from this acute event. Thanks for consult.  Markan Cazarez F 09/24/2011, 3:00 PM

## 2011-09-24 NOTE — Progress Notes (Signed)
ANTICOAGULATION CONSULT NOTE - Follow Up Consult  Pharmacy Consult for Warfarin Indication: atrial fibrillation  Allergies  Allergen Reactions  . Lisinopril Swelling    Patient Measurements: Height: 5\' 9"  (175.3 cm) Weight: 230 lb 6.1 oz (104.5 kg) IBW/kg (Calculated) : 70.7   Vital Signs: Temp: 98 F (36.7 C) (11/16 0600) Temp src: Oral (11/16 0600) BP: 153/79 mmHg (11/16 1011) Pulse Rate: 67  (11/16 1011)  Labs:  Basename 09/24/11 0650 09/23/11 0545 09/22/11 1105 09/22/11 0600  HGB -- 12.9* 13.9 --  HCT -- 38.8* 41.5 --  PLT -- 140* 129* --  APTT -- -- -- --  LABPROT 24.7* 19.1* -- 16.8*  INR 2.19* 1.57* -- 1.34  HEPARINUNFRC -- -- -- --  CREATININE -- 0.89 -- 0.96  CKTOTAL -- -- -- --  CKMB -- -- -- --  TROPONINI -- -- -- --   Estimated Creatinine Clearance: 86.7 ml/min (by C-G formula based on Cr of 0.89).   Medications:  Prescriptions prior to admission  Medication Sig Dispense Refill  . albuterol (PROAIR HFA) 108 (90 BASE) MCG/ACT inhaler Inhale 2 puffs into the lungs every 4 (four) hours as needed. For wheezing  3 Inhaler  3  . ALPRAZolam (XANAX) 0.5 MG tablet Take 0.5 mg by mouth 3 (three) times daily as needed. For anxiety       . amLODipine (NORVASC) 5 MG tablet Take 1 tablet (5 mg total) by mouth daily.  90 tablet  3  . atenolol (TENORMIN) 50 MG tablet Take 1 tablet (50 mg total) by mouth daily.  90 tablet  3  . atorvastatin (LIPITOR) 40 MG tablet Take 1 tablet (40 mg total) by mouth daily.  90 tablet  3  . buPROPion (WELLBUTRIN XL) 150 MG 24 hr tablet Take 1 tablet (150 mg total) by mouth daily.  90 tablet  3  . celecoxib (CELEBREX) 200 MG capsule Take 1 capsule (200 mg total) by mouth 2 (two) times daily.  180 capsule  3  . Cholecalciferol (VITAMIN D3) 1000 UNITS CAPS Take by mouth daily.        . clonazePAM (KLONOPIN) 1 MG tablet Take 1 tablet (1 mg total) by mouth at bedtime as needed for anxiety.  30 tablet  5  . desloratadine (CLARINEX) 5 MG tablet  Take 1 tablet (5 mg total) by mouth daily.  90 tablet  3  . diphenhydrAMINE (BENADRYL) 25 mg capsule Take 25 mg by mouth every 6 (six) hours as needed. For itching       . docusate sodium (COLACE) 100 MG capsule Take 100 mg by mouth 2 (two) times daily.        Marland Kitchen donepezil (ARICEPT) 10 MG tablet Take 10 mg by mouth daily.        Marland Kitchen FLUoxetine (PROZAC) 40 MG capsule Take 1 capsule (40 mg total) by mouth daily.  90 capsule  3  . fluticasone (VERAMYST) 27.5 MCG/SPRAY nasal spray Place 2 sprays into the nose daily.  10 g  3  . Fluticasone-Salmeterol (ADVAIR) 250-50 MCG/DOSE AEPB Inhale 1 puff into the lungs daily.        Marland Kitchen guaifenesin (HUMIBID E) 400 MG TABS Take 400 mg by mouth every 4 (four) hours.        Marland Kitchen ibuprofen (ADVIL,MOTRIN) 200 MG tablet Take 400 mg by mouth every 6 (six) hours as needed. For pain       . l-methylfolate-b2-b6-b12 (CEREFOLIN) 04-08-49-5 MG TABS Take 1 tablet by mouth daily.  90  tablet  3  . levocetirizine (XYZAL) 5 MG tablet TAKE 1 TABLET EVERY EVENING  90 tablet  2  . Multiple Vitamin (MULTIVITAMINS PO) Take by mouth daily.        . solifenacin (VESICARE) 5 MG tablet Take 5 mg by mouth daily.        Marland Kitchen warfarin (COUMADIN) 5 MG tablet Take 5-7.5 mg by mouth daily. Take 5 mg on Saturday, Tuesday, Thursday and Saturday then take 7.5 mg on Monday Wednesday and Friday       Scheduled:    . albuterol  2.5 mg Nebulization TID  . amLODipine  5 mg Oral Daily  . atenolol  50 mg Oral Daily  . buPROPion  150 mg Oral Daily  . celecoxib  200 mg Oral BID  . cholecalciferol  1,000 Units Oral Daily  . darifenacin  7.5 mg Oral Daily  . docusate sodium  100 mg Oral BID  . donepezil  10 mg Oral Daily  . FLUoxetine  40 mg Oral Daily  . fluticasone  2 spray Each Nare Daily  . Fluticasone-Salmeterol  1 puff Inhalation Daily  . folic acid-pyridoxine-cyancobalamin  2 tablet Oral Daily  . furosemide  20 mg Intravenous Q12H  . guaiFENesin  1,200 mg Oral BID  . hydrocortisone   Rectal BID  .  ipratropium  0.5 mg Nebulization TID  . loratadine  10 mg Oral Daily  . moxifloxacin  400 mg Oral q1800  . multivitamins ther. w/minerals  1 tablet Oral Daily  . rosuvastatin  20 mg Oral Daily  . sodium chloride  3 mL Intravenous Q12H  . warfarin  7.5 mg Oral ONCE-1800  . warfarin   Does not apply Once  . DISCONTD: azithromycin  500 mg Intravenous Q24H  . DISCONTD: cefTRIAXone (ROCEPHIN) IV  1 g Intravenous Q24H  . DISCONTD: multivitamin  1 tablet Oral Daily    Assessment: 76 yo M admitted with PNA on warfarin PTA for Afib  Pharmacy Problem List: Atrial fibrillation: on warfarin CAD, HYPERLIPIDEMIA, HTN: On Amlodipine, IV lasix, crestor.  Atenolol  PTA not active currently DEMENTIA, ANXIETY, DEPRESSION: on multiple agents: buproprion, donepizil, fluoxetine Pneumonia: D#4  Antibiotics for CAP, switched to moxifloxacine 11/15 DVT Prophylaxsis: therapeutic INR   Goal of Therapy:  INR 2-3   Plan:  1. Warfarin 7.5 mg daily except 5 mg MWF PTA, INR jump from 1.57 to 2.19 in last 24h will give 5 mg today , continue daily INR 2. Follow up length of antibiotic therapy for CAP  [employer] Pharm.D., BCPS 09/24/2011 11:40 AM [phone]  Nancie Bocanegra Merrily Brittle 09/24/2011,11:34 AM

## 2011-09-24 NOTE — Progress Notes (Signed)
Subjective: Patient seen and examined ,had an episode of bleeding from his hemorrhoids again last night .wife is very concerned.  Objective: Vital signs in last 24 hours: Temp:  [98 F (36.7 C)] 98 F (36.7 C) (11/16 0600) Pulse Rate:  [64-142] 67  (11/16 1011) Resp:  [18-20] 18  (11/16 0600) BP: (115-165)/(68-79) 153/79 mmHg (11/16 1011) SpO2:  [91 %-96 %] 93 % (11/16 0724) Weight change:  Last BM Date: 09/30/11  Intake/Output from previous day: 11/15 0701 - 11/16 0700 In: 852 [P.O.:840; IV Piggyback:12] Out: 1450 [Urine:1450] Total I/O In: 360 [P.O.:360] Out: 600 [Urine:600]   Physical Exam: General: Alert, awake, in no acute distress.  Heart: Regular rate and rhythm, without murmurs, rubs, gallops.  Lungs: Clear to auscultation bilaterally.  Abdomen: Soft, nontender, nondistended, positive bowel sounds.  Rectal exam:external hemorrhoids ,no active bleed noted Extremities: Normal skin color. No cyanosis or edema with positive pedal pulses bilaterally, warm to touch bilaterally.       Lab Results: Results for orders placed during the hospital encounter of 09/21/11 (from the past 24 hour(s))  OCCULT BLOOD X 1 CARD TO LAB, STOOL     Status: Normal   Collection Time   09/23/11  3:57 PM      Component Value Range   Fecal Occult Bld NEGATIVE    GLUCOSE, CAPILLARY     Status: Abnormal   Collection Time   09/23/11  6:25 PM      Component Value Range   Glucose-Capillary 175 (*) 70 - 99 (mg/dL)   Comment 1 Notify RN    PROTIME-INR     Status: Abnormal   Collection Time   09/24/11  6:50 AM      Component Value Range   Prothrombin Time 24.7 (*) 11.6 - 15.2 (seconds)   INR 2.19 (*) 0.00 - 1.49     Studies/Results: No results found.  Medications:    . albuterol  2.5 mg Nebulization TID  . amLODipine  5 mg Oral Daily  . atenolol  50 mg Oral Daily  . buPROPion  150 mg Oral Daily  . celecoxib  200 mg Oral BID  . cholecalciferol  1,000 Units Oral Daily  .  darifenacin  7.5 mg Oral Daily  . docusate sodium  100 mg Oral BID  . donepezil  10 mg Oral Daily  . FLUoxetine  40 mg Oral Daily  . fluticasone  2 spray Each Nare Daily  . Fluticasone-Salmeterol  1 puff Inhalation Daily  . folic acid-pyridoxine-cyancobalamin  2 tablet Oral Daily  . furosemide  20 mg Intravenous Q12H  . guaiFENesin  1,200 mg Oral BID  . hydrocortisone   Rectal BID  . ipratropium  0.5 mg Nebulization TID  . loratadine  10 mg Oral Daily  . moxifloxacin  400 mg Oral q1800  . multivitamins ther. w/minerals  1 tablet Oral Daily  . rosuvastatin  20 mg Oral Daily  . sodium chloride  3 mL Intravenous Q12H  . warfarin  5 mg Oral ONCE-1800  . warfarin  7.5 mg Oral ONCE-1800  . warfarin   Does not apply Once  . DISCONTD: azithromycin  500 mg Intravenous Q24H  . DISCONTD: cefTRIAXone (ROCEPHIN) IV  1 g Intravenous Q24H  . DISCONTD: multivitamin  1 tablet Oral Daily    acetaminophen, albuterol, ALPRAZolam, clonazePAM, diphenhydrAMINE, ibuprofen, ondansetron (ZOFRAN) IV, sodium chloride     Assessment/Plan: Hemorrhoidal bleed  On Anusol ointment ,wife is very concerned and requesting inpatient surgical consult  Will add  senna and miralax Community acquired pneumonia.  improved Continue  Avelox,  Atrial fibrillation  Rate controlled on atenolol, continue Coumadin as per pharmacy.  DEMENTIA  Mental status at baseline  Continue Aricept.  Discharge planning  To SNF when acute issues resolved   Wife updated with treatment plan,all questions and concerns addressed.         LOS: 3 days   Trayon Krantz 09/24/2011, 2:20 PM

## 2011-09-24 NOTE — Consult Note (Signed)
I have seen and examined the patient and agree with the assessment and plans  

## 2011-09-25 LAB — CBC
MCH: 30.3 pg (ref 26.0–34.0)
Platelets: 169 10*3/uL (ref 150–400)
RBC: 4.46 MIL/uL (ref 4.22–5.81)
WBC: 7.8 10*3/uL (ref 4.0–10.5)

## 2011-09-25 LAB — PROTIME-INR
INR: 2.12 — ABNORMAL HIGH (ref 0.00–1.49)
Prothrombin Time: 24.1 seconds — ABNORMAL HIGH (ref 11.6–15.2)

## 2011-09-25 MED ORDER — WARFARIN SODIUM 5 MG PO TABS
5.0000 mg | ORAL_TABLET | Freq: Once | ORAL | Status: AC
  Administered 2011-09-25: 5 mg via ORAL
  Filled 2011-09-25: qty 1

## 2011-09-25 NOTE — Progress Notes (Signed)
ANTICOAGULATION CONSULT NOTE - Follow Up Consult  Pharmacy Consult for Warfarin Indication: atrial fibrillation  Allergies  Allergen Reactions  . Lisinopril Swelling    Patient Measurements: Height: 5\' 9"  (175.3 cm) Weight: 230 lb 6.1 oz (104.5 kg) IBW/kg (Calculated) : 70.7   Vital Signs: Temp: 97.3 F (36.3 C) (11/17 0536) Temp src: Axillary (11/17 0536) BP: 161/80 mmHg (11/17 1011) Pulse Rate: 70  (11/17 1011)  Labs:  Basename 09/25/11 0600 09/24/11 0650 09/23/11 0545  HGB 13.5 -- 12.9*  HCT 40.5 -- 38.8*  PLT 169 -- 140*  APTT -- -- --  LABPROT 24.1* 24.7* 19.1*  INR 2.12* 2.19* 1.57*  HEPARINUNFRC -- -- --  CREATININE -- -- 0.89  CKTOTAL -- -- --  CKMB -- -- --  TROPONINI -- -- --   Estimated Creatinine Clearance: 86.7 ml/min (by C-G formula based on Cr of 0.89).   Medications:  Prescriptions prior to admission  Medication Sig Dispense Refill  . albuterol (PROAIR HFA) 108 (90 BASE) MCG/ACT inhaler Inhale 2 puffs into the lungs every 4 (four) hours as needed. For wheezing  3 Inhaler  3  . ALPRAZolam (XANAX) 0.5 MG tablet Take 0.5 mg by mouth 3 (three) times daily as needed. For anxiety       . amLODipine (NORVASC) 5 MG tablet Take 1 tablet (5 mg total) by mouth daily.  90 tablet  3  . atenolol (TENORMIN) 50 MG tablet Take 1 tablet (50 mg total) by mouth daily.  90 tablet  3  . atorvastatin (LIPITOR) 40 MG tablet Take 1 tablet (40 mg total) by mouth daily.  90 tablet  3  . buPROPion (WELLBUTRIN XL) 150 MG 24 hr tablet Take 1 tablet (150 mg total) by mouth daily.  90 tablet  3  . celecoxib (CELEBREX) 200 MG capsule Take 1 capsule (200 mg total) by mouth 2 (two) times daily.  180 capsule  3  . Cholecalciferol (VITAMIN D3) 1000 UNITS CAPS Take by mouth daily.        . clonazePAM (KLONOPIN) 1 MG tablet Take 1 tablet (1 mg total) by mouth at bedtime as needed for anxiety.  30 tablet  5  . desloratadine (CLARINEX) 5 MG tablet Take 1 tablet (5 mg total) by mouth daily.   90 tablet  3  . diphenhydrAMINE (BENADRYL) 25 mg capsule Take 25 mg by mouth every 6 (six) hours as needed. For itching       . docusate sodium (COLACE) 100 MG capsule Take 100 mg by mouth 2 (two) times daily.        Marland Kitchen donepezil (ARICEPT) 10 MG tablet Take 10 mg by mouth daily.        Marland Kitchen FLUoxetine (PROZAC) 40 MG capsule Take 1 capsule (40 mg total) by mouth daily.  90 capsule  3  . fluticasone (VERAMYST) 27.5 MCG/SPRAY nasal spray Place 2 sprays into the nose daily.  10 g  3  . Fluticasone-Salmeterol (ADVAIR) 250-50 MCG/DOSE AEPB Inhale 1 puff into the lungs daily.        Marland Kitchen guaifenesin (HUMIBID E) 400 MG TABS Take 400 mg by mouth every 4 (four) hours.        Marland Kitchen ibuprofen (ADVIL,MOTRIN) 200 MG tablet Take 400 mg by mouth every 6 (six) hours as needed. For pain       . l-methylfolate-b2-b6-b12 (CEREFOLIN) 04-08-49-5 MG TABS Take 1 tablet by mouth daily.  90 tablet  3  . levocetirizine (XYZAL) 5 MG tablet TAKE 1 TABLET  EVERY EVENING  90 tablet  2  . Multiple Vitamin (MULTIVITAMINS PO) Take by mouth daily.        . solifenacin (VESICARE) 5 MG tablet Take 5 mg by mouth daily.        Marland Kitchen warfarin (COUMADIN) 5 MG tablet Take 5-7.5 mg by mouth daily. Take 5 mg on Saturday, Tuesday, Thursday and Saturday then take 7.5 mg on Monday Wednesday and Friday       Scheduled:    . albuterol  2.5 mg Nebulization TID  . amLODipine  5 mg Oral Daily  . atenolol  50 mg Oral Daily  . buPROPion  150 mg Oral Daily  . celecoxib  200 mg Oral BID  . cholecalciferol  1,000 Units Oral Daily  . darifenacin  7.5 mg Oral Daily  . docusate sodium  100 mg Oral BID  . donepezil  10 mg Oral Daily  . FLUoxetine  40 mg Oral Daily  . fluticasone  2 spray Each Nare Daily  . Fluticasone-Salmeterol  1 puff Inhalation Daily  . folic acid-pyridoxine-cyancobalamin  2 tablet Oral Daily  . furosemide  20 mg Intravenous Q12H  . guaiFENesin  1,200 mg Oral BID  . hydrocortisone   Rectal BID  . ipratropium  0.5 mg Nebulization TID  .  loratadine  10 mg Oral Daily  . moxifloxacin  400 mg Oral q1800  . multivitamins ther. w/minerals  1 tablet Oral Daily  . rosuvastatin  20 mg Oral Daily  . sodium chloride  3 mL Intravenous Q12H  . warfarin  5 mg Oral ONCE-1800  . warfarin   Does not apply Once    Assessment: 76 yo M admitted with PNA (CAP, treating with moxifloxacin) on warfarin PTA for Afib. INR today continues to be therapeutic though slightly decreased from yesterday.   Goal of Therapy:  INR 2-3   Plan:  Coumadin 5mg  PO x1 today (will follow home regimen) F/u daily PT/INR  Maudry Mayhew N 09/25/2011,1:05 PM

## 2011-09-25 NOTE — Progress Notes (Signed)
Subjective: Patient seen and examined, denies any complaints. Feeling better no shortness of breath or cough.  Objective: Vital signs in last 24 hours: Temp:  [97.3 F (36.3 C)-98.6 F (37 C)] 97.3 F (36.3 C) (11/17 0536) Pulse Rate:  [57-93] 70  (11/17 1011) Resp:  [16-20] 18  (11/17 0536) BP: (122-161)/(53-81) 161/80 mmHg (11/17 1011) SpO2:  [93 %-94 %] 93 % (11/17 0536) Weight change:  Last BM Date: 09/24/11  Intake/Output from previous day: 11/16 0701 - 11/17 0700 In: 840 [P.O.:840] Out: 1000 [Urine:1000] Total I/O In: 240 [P.O.:240] Out: -    Physical Exam: General: Alert, awake, in no acute distress.  Heart: Regular rate and rhythm, without murmurs, rubs, gallops.  Lungs: Clear to auscultation bilaterally.  Abdomen: Soft, nontender, nondistended, positive bowel sounds.  Rectal exam:external hemorrhoids ,no active bleed noted  Extremities: Normal skin color. No cyanosis or edema with positive pedal pulses bilaterally, warm to touch bilaterally.      Lab Results: Results for orders placed during the hospital encounter of 09/21/11 (from the past 24 hour(s))  PROTIME-INR     Status: Abnormal   Collection Time   09/25/11  6:00 AM      Component Value Range   Prothrombin Time 24.1 (*) 11.6 - 15.2 (seconds)   INR 2.12 (*) 0.00 - 1.49   CBC     Status: Normal   Collection Time   09/25/11  6:00 AM      Component Value Range   WBC 7.8  4.0 - 10.5 (K/uL)   RBC 4.46  4.22 - 5.81 (MIL/uL)   Hemoglobin 13.5  13.0 - 17.0 (g/dL)   HCT 54.0  98.1 - 19.1 (%)   MCV 90.8  78.0 - 100.0 (fL)   MCH 30.3  26.0 - 34.0 (pg)   MCHC 33.3  30.0 - 36.0 (g/dL)   RDW 47.8  29.5 - 62.1 (%)   Platelets 169  150 - 400 (K/uL)    Studies/Results: No results found.  Medications:    . albuterol  2.5 mg Nebulization TID  . amLODipine  5 mg Oral Daily  . atenolol  50 mg Oral Daily  . buPROPion  150 mg Oral Daily  . celecoxib  200 mg Oral BID  . cholecalciferol  1,000 Units Oral  Daily  . darifenacin  7.5 mg Oral Daily  . docusate sodium  100 mg Oral BID  . donepezil  10 mg Oral Daily  . FLUoxetine  40 mg Oral Daily  . fluticasone  2 spray Each Nare Daily  . Fluticasone-Salmeterol  1 puff Inhalation Daily  . folic acid-pyridoxine-cyancobalamin  2 tablet Oral Daily  . furosemide  20 mg Intravenous Q12H  . guaiFENesin  1,200 mg Oral BID  . hydrocortisone   Rectal BID  . ipratropium  0.5 mg Nebulization TID  . loratadine  10 mg Oral Daily  . moxifloxacin  400 mg Oral q1800  . multivitamins ther. w/minerals  1 tablet Oral Daily  . rosuvastatin  20 mg Oral Daily  . sodium chloride  3 mL Intravenous Q12H  . warfarin  5 mg Oral ONCE-1800  . warfarin   Does not apply Once    acetaminophen, albuterol, ALPRAZolam, clonazePAM, diphenhydrAMINE, ondansetron (ZOFRAN) IV, polyethylene glycol, senna, sodium chloride, DISCONTD: ibuprofen     Assessment/Plan: Hemorrhoidal bleed  Appreciate CCS input. Continue Anusol ointment and laxatives. Community acquired pneumonia.  improved  Continue Avelox, to complete 7 days of therapy. Atrial fibrillation  Rate controlled on atenolol,  continue Coumadin as per pharmacy.  DEMENTIA  Mental status at baseline  Continue Aricept.  Discharge planning  To SNF on Monday.  Wife updated with treatment plan,all questions and concerns addressed on daily basis.     LOS: 4 days   Edahi Kroening 09/25/2011, 12:43 PM

## 2011-09-26 LAB — PROTIME-INR: Prothrombin Time: 23.3 seconds — ABNORMAL HIGH (ref 11.6–15.2)

## 2011-09-26 MED ORDER — FUROSEMIDE 40 MG PO TABS
40.0000 mg | ORAL_TABLET | Freq: Every day | ORAL | Status: DC
  Administered 2011-09-27: 40 mg via ORAL
  Filled 2011-09-26 (×2): qty 1

## 2011-09-26 MED ORDER — WARFARIN SODIUM 7.5 MG PO TABS
7.5000 mg | ORAL_TABLET | Freq: Once | ORAL | Status: AC
  Administered 2011-09-26: 7.5 mg via ORAL
  Filled 2011-09-26: qty 1

## 2011-09-26 NOTE — Progress Notes (Signed)
ANTICOAGULATION CONSULT NOTE - Follow Up Consult  Pharmacy Consult for Warfarin Indication: atrial fibrillation  Allergies  Allergen Reactions  . Lisinopril Swelling    Patient Measurements: Height: 5\' 9"  (175.3 cm) Weight: 225 lb 4.8 oz (102.195 kg) IBW/kg (Calculated) : 70.7   Vital Signs: Temp: 98.6 F (37 C) (11/18 0551) Temp src: Oral (11/17 2200) BP: 144/61 mmHg (11/18 0551) Pulse Rate: 58  (11/18 0551)  Labs:  Basename 09/26/11 0635 09/25/11 0600 09/24/11 0650  HGB -- 13.5 --  HCT -- 40.5 --  PLT -- 169 --  APTT -- -- --  LABPROT 23.3* 24.1* 24.7*  INR 2.03* 2.12* 2.19*  HEPARINUNFRC -- -- --  CREATININE -- -- --  CKTOTAL -- -- --  CKMB -- -- --  TROPONINI -- -- --   Estimated Creatinine Clearance: 85.8 ml/min (by C-G formula based on Cr of 0.89).   Medications:  Prescriptions prior to admission  Medication Sig Dispense Refill  . albuterol (PROAIR HFA) 108 (90 BASE) MCG/ACT inhaler Inhale 2 puffs into the lungs every 4 (four) hours as needed. For wheezing  3 Inhaler  3  . ALPRAZolam (XANAX) 0.5 MG tablet Take 0.5 mg by mouth 3 (three) times daily as needed. For anxiety       . amLODipine (NORVASC) 5 MG tablet Take 1 tablet (5 mg total) by mouth daily.  90 tablet  3  . atenolol (TENORMIN) 50 MG tablet Take 1 tablet (50 mg total) by mouth daily.  90 tablet  3  . atorvastatin (LIPITOR) 40 MG tablet Take 1 tablet (40 mg total) by mouth daily.  90 tablet  3  . buPROPion (WELLBUTRIN XL) 150 MG 24 hr tablet Take 1 tablet (150 mg total) by mouth daily.  90 tablet  3  . celecoxib (CELEBREX) 200 MG capsule Take 1 capsule (200 mg total) by mouth 2 (two) times daily.  180 capsule  3  . Cholecalciferol (VITAMIN D3) 1000 UNITS CAPS Take by mouth daily.        . clonazePAM (KLONOPIN) 1 MG tablet Take 1 tablet (1 mg total) by mouth at bedtime as needed for anxiety.  30 tablet  5  . desloratadine (CLARINEX) 5 MG tablet Take 1 tablet (5 mg total) by mouth daily.  90 tablet  3   . diphenhydrAMINE (BENADRYL) 25 mg capsule Take 25 mg by mouth every 6 (six) hours as needed. For itching       . docusate sodium (COLACE) 100 MG capsule Take 100 mg by mouth 2 (two) times daily.        Marland Kitchen donepezil (ARICEPT) 10 MG tablet Take 10 mg by mouth daily.        Marland Kitchen FLUoxetine (PROZAC) 40 MG capsule Take 1 capsule (40 mg total) by mouth daily.  90 capsule  3  . fluticasone (VERAMYST) 27.5 MCG/SPRAY nasal spray Place 2 sprays into the nose daily.  10 g  3  . Fluticasone-Salmeterol (ADVAIR) 250-50 MCG/DOSE AEPB Inhale 1 puff into the lungs daily.        Marland Kitchen guaifenesin (HUMIBID E) 400 MG TABS Take 400 mg by mouth every 4 (four) hours.        Marland Kitchen ibuprofen (ADVIL,MOTRIN) 200 MG tablet Take 400 mg by mouth every 6 (six) hours as needed. For pain       . l-methylfolate-b2-b6-b12 (CEREFOLIN) 04-08-49-5 MG TABS Take 1 tablet by mouth daily.  90 tablet  3  . levocetirizine (XYZAL) 5 MG tablet TAKE 1 TABLET  EVERY EVENING  90 tablet  2  . Multiple Vitamin (MULTIVITAMINS PO) Take by mouth daily.        . solifenacin (VESICARE) 5 MG tablet Take 5 mg by mouth daily.        Marland Kitchen warfarin (COUMADIN) 5 MG tablet Take 5-7.5 mg by mouth daily. Take 5 mg on Saturday, Tuesday, Thursday and Saturday then take 7.5 mg on Monday Wednesday and Friday       Scheduled:     . albuterol  2.5 mg Nebulization TID  . amLODipine  5 mg Oral Daily  . atenolol  50 mg Oral Daily  . buPROPion  150 mg Oral Daily  . celecoxib  200 mg Oral BID  . cholecalciferol  1,000 Units Oral Daily  . darifenacin  7.5 mg Oral Daily  . docusate sodium  100 mg Oral BID  . donepezil  10 mg Oral Daily  . FLUoxetine  40 mg Oral Daily  . fluticasone  2 spray Each Nare Daily  . Fluticasone-Salmeterol  1 puff Inhalation Daily  . folic acid-pyridoxine-cyancobalamin  2 tablet Oral Daily  . furosemide  20 mg Intravenous Q12H  . guaiFENesin  1,200 mg Oral BID  . hydrocortisone   Rectal BID  . ipratropium  0.5 mg Nebulization TID  . loratadine  10  mg Oral Daily  . moxifloxacin  400 mg Oral q1800  . multivitamins ther. w/minerals  1 tablet Oral Daily  . rosuvastatin  20 mg Oral Daily  . sodium chloride  3 mL Intravenous Q12H  . warfarin  5 mg Oral ONCE-1800  . warfarin  7.5 mg Oral ONCE-1800  . warfarin   Does not apply Once    Assessment: 76 yo M admitted with PNA (CAP, treating with moxifloxacin) on warfarin PTA for Afib. INR today continues to be therapeutic though has been slowly trending down. No CBC today, has been stable.   Goal of Therapy:  INR 2-3   Plan:  Coumadin 7.5 mg PO x1 today F/u daily PT/INR  [name] [name] N 09/26/2011,8:40 AM

## 2011-09-26 NOTE — Progress Notes (Signed)
Subjective: Patient seen and examined, denies any complaints. Objective: Vital signs in last 24 hours: Temp:  [98.3 F (36.8 C)-98.6 F (37 C)] 98.6 F (37 C) (11/18 0551) Pulse Rate:  [58-73] 73  (11/18 0951) Resp:  [16-18] 16  (11/18 0551) BP: (138-145)/(61-78) 138/68 mmHg (11/18 0949) SpO2:  [89 %-94 %] 89 % (11/18 0746) Weight:  [102.195 kg (225 lb 4.8 oz)] 225 lb 4.8 oz (102.195 kg) (11/17 1404) Weight change:  Last BM Date: 09/24/11  Intake/Output from previous day: 11/17 0701 - 11/18 0700 In: 480 [P.O.:480] Out: -      Physical Exam: General: Alert, awake, in no acute distress.  Heart: Regular rate and rhythm, without murmurs, rubs, gallops.  Lungs: Clear to auscultation bilaterally.  Abdomen: Soft, nontender, nondistended, positive bowel sounds.  Extremities: Normal skin color. No cyanosis or edema with positive pedal pulses bilaterally, warm to touch bilaterally.     Lab Results: Results for orders placed during the hospital encounter of 09/21/11 (from the past 24 hour(s))  PROTIME-INR     Status: Abnormal   Collection Time   09/26/11  6:35 AM      Component Value Range   Prothrombin Time 23.3 (*) 11.6 - 15.2 (seconds)   INR 2.03 (*) 0.00 - 1.49     Studies/Results: No results found.  Medications:    . albuterol  2.5 mg Nebulization TID  . amLODipine  5 mg Oral Daily  . atenolol  50 mg Oral Daily  . buPROPion  150 mg Oral Daily  . celecoxib  200 mg Oral BID  . cholecalciferol  1,000 Units Oral Daily  . darifenacin  7.5 mg Oral Daily  . docusate sodium  100 mg Oral BID  . donepezil  10 mg Oral Daily  . FLUoxetine  40 mg Oral Daily  . fluticasone  2 spray Each Nare Daily  . Fluticasone-Salmeterol  1 puff Inhalation Daily  . folic acid-pyridoxine-cyancobalamin  2 tablet Oral Daily  . furosemide  40 mg Oral Daily  . guaiFENesin  1,200 mg Oral BID  . hydrocortisone   Rectal BID  . ipratropium  0.5 mg Nebulization TID  . loratadine  10 mg Oral Daily   . moxifloxacin  400 mg Oral q1800  . multivitamins ther. w/minerals  1 tablet Oral Daily  . rosuvastatin  20 mg Oral Daily  . sodium chloride  3 mL Intravenous Q12H  . warfarin  5 mg Oral ONCE-1800  . warfarin  7.5 mg Oral ONCE-1800  . warfarin   Does not apply Once  . DISCONTD: furosemide  20 mg Intravenous Q12H    acetaminophen, albuterol, ALPRAZolam, clonazePAM, diphenhydrAMINE, ondansetron (ZOFRAN) IV, polyethylene glycol, senna, sodium chloride     Assessment/Plan: Community acquired pneumonia.  improved  Continue Avelox, to complete 7 days of therapy.  Atrial fibrillation  Rate controlled on atenolol, continue Coumadin as per pharmacy.  DEMENTIA  Mental status at baseline  Continue Aricept.  Hemorrhoidal bleed  Continue Anusol ointment and laxatives. Discharge planning  To SNF on Monday.  Wife updated with treatment plan,all questions and concerns addressed on daily basis.   LOS: 5 days   [name] [name] 09/26/2011, 1:42 PM

## 2011-09-27 LAB — CULTURE, BLOOD (ROUTINE X 2)
Culture  Setup Time: 201211130855
Culture: NO GROWTH

## 2011-09-27 LAB — PROTIME-INR: INR: 2.19 — ABNORMAL HIGH (ref 0.00–1.49)

## 2011-09-27 MED ORDER — POLYETHYLENE GLYCOL 3350 17 G PO PACK: 17.0000 g | PACK | Freq: Every day | ORAL | Status: AC | PRN

## 2011-09-27 MED ORDER — MOXIFLOXACIN HCL 400 MG PO TABS
400.0000 mg | ORAL_TABLET | ORAL | Status: DC
  Administered 2011-09-27: 400 mg via ORAL
  Filled 2011-09-27: qty 1

## 2011-09-27 MED ORDER — SENNA 8.6 MG PO TABS: 2.0000 | ORAL_TABLET | Freq: Every day | ORAL | Status: DC | PRN

## 2011-09-27 MED ORDER — POTASSIUM CHLORIDE ER 10 MEQ PO TBCR: 20.0000 meq | EXTENDED_RELEASE_TABLET | Freq: Every day | ORAL | Status: DC

## 2011-09-27 MED ORDER — HYDROCORTISONE 2.5 % RE CREA: TOPICAL_CREAM | Freq: Two times a day (BID) | RECTAL | Status: AC

## 2011-09-27 MED ORDER — POTASSIUM CHLORIDE ER 10 MEQ PO TBCR: 20.0000 meq | EXTENDED_RELEASE_TABLET | Freq: Two times a day (BID) | ORAL | Status: DC

## 2011-09-27 MED ORDER — ACETAMINOPHEN 325 MG PO TABS: 650.0000 mg | ORAL_TABLET | Freq: Four times a day (QID) | ORAL | Status: AC | PRN

## 2011-09-27 MED ORDER — WARFARIN SODIUM 7.5 MG PO TABS
7.5000 mg | ORAL_TABLET | Freq: Once | ORAL | Status: DC
Start: 2011-09-27 — End: 2011-09-27
  Filled 2011-09-27: qty 1

## 2011-09-27 MED ORDER — FUROSEMIDE 40 MG PO TABS: 40.0000 mg | ORAL_TABLET | Freq: Every day | ORAL | Status: DC

## 2011-09-27 NOTE — Progress Notes (Signed)
ANTICOAGULATION CONSULT NOTE - Follow Up Consult  Pharmacy Consult for Warfarin Indication: atrial fibrillation  Allergies  Allergen Reactions  . Lisinopril Swelling    Patient Measurements: Height: 5\' 9"  (175.3 cm) Weight: 225 lb 4.8 oz (102.195 kg) IBW/kg (Calculated) : 70.7   Vital Signs: Temp: 98.2 F (36.8 C) (11/19 0559) Temp src: Axillary (11/19 0559) BP: 132/75 mmHg (11/19 0559) Pulse Rate: 61  (11/19 0559)  Labs:  Basename 09/27/11 0522 09/26/11 0635 09/25/11 0600  HGB -- -- 13.5  HCT -- -- 40.5  PLT -- -- 169  APTT -- -- --  LABPROT 24.7* 23.3* 24.1*  INR 2.19* 2.03* 2.12*  HEPARINUNFRC -- -- --  CREATININE -- -- --  CKTOTAL -- -- --  CKMB -- -- --  TROPONINI -- -- --   Estimated Creatinine Clearance: 85.8 ml/min (by C-G formula based on Cr of 0.89).   Scheduled:     . albuterol  2.5 mg Nebulization TID  . amLODipine  5 mg Oral Daily  . atenolol  50 mg Oral Daily  . buPROPion  150 mg Oral Daily  . celecoxib  200 mg Oral BID  . cholecalciferol  1,000 Units Oral Daily  . darifenacin  7.5 mg Oral Daily  . docusate sodium  100 mg Oral BID  . donepezil  10 mg Oral Daily  . FLUoxetine  40 mg Oral Daily  . fluticasone  2 spray Each Nare Daily  . Fluticasone-Salmeterol  1 puff Inhalation Daily  . folic acid-pyridoxine-cyancobalamin  2 tablet Oral Daily  . furosemide  40 mg Oral Daily  . guaiFENesin  1,200 mg Oral BID  . hydrocortisone   Rectal BID  . ipratropium  0.5 mg Nebulization TID  . loratadine  10 mg Oral Daily  . moxifloxacin  400 mg Oral q1800  . multivitamins ther. w/minerals  1 tablet Oral Daily  . rosuvastatin  20 mg Oral Daily  . sodium chloride  3 mL Intravenous Q12H  . warfarin  7.5 mg Oral ONCE-1800  . warfarin   Does not apply Once  . DISCONTD: furosemide  20 mg Intravenous Q12H    Assessment: 76 yo M admitted with PNA (CAP, treating with moxifloxacin) on warfarin PTA for Afib. INR today continues to be therapeutic though has  been slowly trending down.  Noted for SNF today- recommend discharge on home coumadin regimen (7.5mg  MWF & 5mg  STTS)  Goal of Therapy:  INR 2-3   Plan:  Coumadin 7.5 mg PO x1 today F/u daily PT/INR  Benny Lennert 09/27/2011,8:50 AM

## 2011-09-27 NOTE — Progress Notes (Signed)
Clinical Social Work, 09/27/11, 1500:  Patient will be discharged to [facility]  CSW faxed facility information and arranged ambulance transportation.  Pt and wife are agreeable to plan.Kayleena Eke, [place], [place], [phone]

## 2011-09-27 NOTE — Discharge Summary (Addendum)
Patient ID: [name] [name] MRN: [id number] DOB/AGE: Aug 14, 1935 76 y.o.  Admit date: 09/21/2011 Discharge date: 09/27/2011  Primary Care Physician:  Oliver Barre, MD, MD   Discharge Diagnoses:    Present on Admission:  . community acquired Pneumonia .Respiratory failure, resolved  .Sepsis with acute organ dysfunction Hemorrhoids  .Atrial fibrillation  Secondary discharge diagnoses  .OBSTRUCTIVE SLEEP APNEA .ANXIETY .DEPRESSION .DEMENTIA .RESTLESS LEGS SYNDROME .CORONARY ARTERY DISEASE .INSOMNIA-SLEEP DISORDER-UNSPEC  Current Discharge Medication List    START taking these medications   Details  acetaminophen (TYLENOL) 325 MG tablet Take 2 tablets (650 mg total) by mouth every 6 (six) hours as needed for fever. Qty: 30 tablet, Refills: 0    furosemide (LASIX) 40 MG tablet Take 1 tablet (40 mg total) by mouth daily. Qty: 30 tablet, Refills: 0    hydrocortisone (ANUSOL-HC) 2.5 % rectal cream Place rectally 2 (two) times daily. Qty: 30 g, Refills: 0    polyethylene glycol (MIRALAX / GLYCOLAX) packet Take 17 g by mouth daily as needed. Qty: 14 each, Refills: 0    potassium chloride (K-DUR) 10 MEQ tablet Take 2 tablets (20 mEq total) by mouth  daily. Qty: 30 tablet, Refills: 0    senna (SENOKOT) 8.6 MG TABS Take 2 tablets (17.2 mg total) by mouth daily as needed. Qty: 120 each, Refills: 0      CONTINUE these medications which have NOT CHANGED   Details  albuterol (PROAIR HFA) 108 (90 BASE) MCG/ACT inhaler Inhale 2 puffs into the lungs every 4 (four) hours as needed. For wheezing Qty: 3 Inhaler, Refills: 3    ALPRAZolam (XANAX) 0.5 MG tablet Take 0.5 mg by mouth 3 (three) times daily as needed. For anxiety     amLODipine (NORVASC) 5 MG tablet Take 1 tablet (5 mg total) by mouth daily. Qty: 90 tablet, Refills: 3    atenolol (TENORMIN) 50 MG tablet Take 1 tablet (50 mg total) by mouth daily. Qty: 90 tablet, Refills: 3    atorvastatin (LIPITOR) 40 MG tablet Take 1  tablet (40 mg total) by mouth daily. Qty: 90 tablet, Refills: 3    buPROPion (WELLBUTRIN XL) 150 MG 24 hr tablet Take 1 tablet (150 mg total) by mouth daily. Qty: 90 tablet, Refills: 3    Cholecalciferol (VITAMIN D3) 1000 UNITS CAPS Take by mouth daily.      clonazePAM (KLONOPIN) 1 MG tablet Take 1 tablet (1 mg total) by mouth at bedtime as needed for anxiety. Qty: 30 tablet, Refills: 5    desloratadine (CLARINEX) 5 MG tablet Take 1 tablet (5 mg total) by mouth daily. Qty: 90 tablet, Refills: 3    diphenhydrAMINE (BENADRYL) 25 mg capsule Take 25 mg by mouth every 6 (six) hours as needed. For itching     docusate sodium (COLACE) 100 MG capsule Take 100 mg by mouth 2 (two) times daily.      donepezil (ARICEPT) 10 MG tablet Take 10 mg by mouth daily.      FLUoxetine (PROZAC) 40 MG capsule Take 1 capsule (40 mg total) by mouth daily. Qty: 90 capsule, Refills: 3    fluticasone (VERAMYST) 27.5 MCG/SPRAY nasal spray Place 2 sprays into the nose daily. Qty: 10 g, Refills: 3    Fluticasone-Salmeterol (ADVAIR) 250-50 MCG/DOSE AEPB Inhale 1 puff into the lungs daily.      guaifenesin (HUMIBID E) 400 MG TABS Take 400 mg by mouth every 4 (four) hours.      l-methylfolate-b2-b6-b12 (CEREFOLIN) 04-08-49-5 MG TABS Take 1 tablet by mouth  daily. Qty: 90 tablet, Refills: 3    levocetirizine (XYZAL) 5 MG tablet TAKE 1 TABLET EVERY EVENING Qty: 90 tablet, Refills: 2    Multiple Vitamin (MULTIVITAMINS PO) Take by mouth daily.      solifenacin (VESICARE) 5 MG tablet Take 5 mg by mouth daily.      warfarin (COUMADIN) 5 MG tablet Take 5-7.5 mg by mouth daily. Take 5 mg on Saturday, Tuesday, Thursday and Saturday then take 7.5 mg on Monday Wednesday and Friday      STOP taking these medications     celecoxib (CELEBREX) 200 MG capsule      ibuprofen (ADVIL,MOTRIN) 200 MG tablet          Consults:   General surgery  Significant Diagnostic Studies:  Chest x-ray IMPRESSION:  Mild patchy  bilateral lower lobe opacities, likely atelectasis.  Left lower lobe pneumonia is not entirely excluded.   Brief H and P: For complete details please refer to admission H and P, but in brief  Pt is a 76 yr old male with moderate to severe dementia who was found to be having violent shaking last night at home. She checked his temperature and found it to be 102.3. He then had emesis x1. he patient's wife is the historian for this patient. She states that she had noticed that the patient had had a slight cough, but had not noticed any SOB, sputum production, or fevers until last evening.   Hospital Course:   *Respiratory failure Resolved. Sepsis : Resolved, urine and blood culture showed no growth. Community acquired pneumonia.  Treated initially Rocephin and Zithromax, then transitioned to by mouth Avelox to complete 7 days of therapy last dose was received today. Atrial fibrillation  Rate controlled on atenolol,  Pharmacy was adjusting Coumadin. INR is therapeutic Hemorrhoids:  Patient noted to have bleeding from his external hemorrhoids, he was evaluated by surgical service as per wife request who recommended to continue with Anusol cream and laxative. Stool for occult blood was checked and it was negative. DEMENTIA  Mental status at baseline  Continue Aricept.  Physical deconditioning: Patient seen by PT and discharge to a skilled nursing facility is recommended.  Wife updated with treatment plan,all questions and concerns addressed on daily basis.   Subjective: Patient seen and examined by me today he denies any complaints.   Filed Vitals:   09/27/11 0939  BP: 156/76  Pulse: 60  Temp:   Resp:     Physical Exam:  General: Alert, awake, in no acute distress.  Heart: Regular rate and rhythm, without murmurs, rubs, gallops.  Lungs: Clear to auscultation bilaterally.  Abdomen: Soft, nontender, nondistended, positive bowel sounds.  Extremities: Normal skin color. No cyanosis or  edema with positive pedal pulses bilaterally, warm to touch bilaterally.     Disposition and Follow-up:  To SNF Repeat PT/ INR  in 2 days and adjust Coumadin accordingly. Will defer to  the rounding physician at the nursing home facility. Follow with PCP within 1-2 weeks. Continue with CPAP for OSA.  Time spent on Discharge: 50 Minutes    Signed: Petrona Wyeth 09/27/2011, 1:31 PM

## 2011-09-27 NOTE — Progress Notes (Signed)
Utilization review complete 

## 2011-09-27 NOTE — Progress Notes (Signed)
Patient Dc to SNF for rehab.  Patient is stable and discharged home with wife, transported via ambulance.  Lethea Killings, RN

## 2011-10-07 ENCOUNTER — Ambulatory Visit: Payer: Medicare Other | Admitting: Internal Medicine

## 2011-10-08 ENCOUNTER — Ambulatory Visit: Payer: Medicare Other | Admitting: Internal Medicine

## 2011-10-12 ENCOUNTER — Telehealth: Payer: Self-pay

## 2011-10-12 NOTE — Telephone Encounter (Signed)
Chart reviewed; pt discharged to SNF nov 19 after hospn for acute resp failure, pna, and hemorrhoidal bleeding  Unclear to me how his coumadin was adjusted while hospd (start dose vs d/c dose)  INR on admit 2.1, and now 3.0 as per note  Robin to contact wife (pt with dementia);  Need to know pt current situation; is he in SNF and to be followed by MD there (including INR's) or is pt at home;  Need to know coumadin dosing at time of going into hosp, and current coumadin dose as well;   Also is there still hemorrhoidal bleeding?  Does pt have any upcoming appt at [facility] for f/u INR?

## 2011-10-12 NOTE — Telephone Encounter (Signed)
Eliseo Squires RN called to inform the patients INR 3.0 and PT 30.3, Please advise when to check again. Erica's call back number is [phone] please advise

## 2011-10-13 ENCOUNTER — Ambulatory Visit (INDEPENDENT_AMBULATORY_CARE_PROVIDER_SITE_OTHER): Payer: Self-pay | Admitting: Internal Medicine

## 2011-10-13 DIAGNOSIS — R0989 Other specified symptoms and signs involving the circulatory and respiratory systems: Secondary | ICD-10-CM

## 2011-10-13 DIAGNOSIS — I4891 Unspecified atrial fibrillation: Secondary | ICD-10-CM

## 2011-10-13 DIAGNOSIS — Z7901 Long term (current) use of anticoagulants: Secondary | ICD-10-CM

## 2011-10-13 NOTE — Telephone Encounter (Signed)
Spoke to the patients wife. He is not having any bleeding at this time. He was off coumadin 6 days prior to going into the hospital due to injection in spine. He is taking 5 mg Sunday, Tuesday Thursday and Saturday, then 7.5 Monday , Wednesday and Friday. He took 5 mg last night. He has been monitored by [facility] Spore, but his wife is going to schedule him to return to the coumadin clinic next week.

## 2011-10-13 NOTE — Telephone Encounter (Signed)
Spoke with pt's wife.  Addressed INR from yesterday and instructed Liberty to call Coumadin clinic with future INRs.   See Anti-Coag visit for further details.

## 2011-10-18 ENCOUNTER — Ambulatory Visit (INDEPENDENT_AMBULATORY_CARE_PROVIDER_SITE_OTHER): Payer: Self-pay | Admitting: Cardiovascular Disease

## 2011-10-18 DIAGNOSIS — I4891 Unspecified atrial fibrillation: Secondary | ICD-10-CM

## 2011-10-18 DIAGNOSIS — Z7901 Long term (current) use of anticoagulants: Secondary | ICD-10-CM

## 2011-10-18 DIAGNOSIS — R0989 Other specified symptoms and signs involving the circulatory and respiratory systems: Secondary | ICD-10-CM

## 2011-10-18 NOTE — Patient Instructions (Signed)
The patient should avoid any OTC items containing aspirin or ibuprofen, and should avoid great swings in general diet.  Avoid alcohol consumption.  Call if any signs of abnormal bleeding.

## 2011-10-19 ENCOUNTER — Telehealth: Payer: Self-pay | Admitting: Internal Medicine

## 2011-10-19 NOTE — Telephone Encounter (Signed)
Ok for verbal 

## 2011-10-19 NOTE — Telephone Encounter (Signed)
Ok for letter

## 2011-10-19 NOTE — Telephone Encounter (Signed)
[facility] rn called and is asking for a verbal order for physical therapy in the pt's home following hospital discharge for 3/wk for 3-4 wks.  Please advise if this is okay.    Thanks!

## 2011-10-19 NOTE — Telephone Encounter (Signed)
Informed verbal ok, but requesting a letter stating ok for PT to keep on filed as PT is already in the home. Is ok to do letter? Please advise

## 2011-10-20 NOTE — Telephone Encounter (Signed)
Completed letter and faxed to [place] at [facility] at [phone]

## 2011-10-25 ENCOUNTER — Ambulatory Visit: Payer: Medicare Other | Admitting: Internal Medicine

## 2011-10-25 ENCOUNTER — Ambulatory Visit (INDEPENDENT_AMBULATORY_CARE_PROVIDER_SITE_OTHER): Payer: Self-pay | Admitting: Cardiology

## 2011-10-25 ENCOUNTER — Telehealth: Payer: Self-pay

## 2011-10-25 DIAGNOSIS — Z7901 Long term (current) use of anticoagulants: Secondary | ICD-10-CM

## 2011-10-25 DIAGNOSIS — I4891 Unspecified atrial fibrillation: Secondary | ICD-10-CM

## 2011-10-25 DIAGNOSIS — R0989 Other specified symptoms and signs involving the circulatory and respiratory systems: Secondary | ICD-10-CM

## 2011-10-25 LAB — POCT INR: INR: 2

## 2011-10-25 NOTE — Telephone Encounter (Signed)
Pharmacist called for clarification. Patient recently filled levocetirizine 5 mg and is on as well clarinex 5 mg. Please clarify which patient is to take. [facility] reference number [id number]

## 2011-10-25 NOTE — Telephone Encounter (Signed)
Pt has dementia  Please ask wife which she would like, as they both would be ok

## 2011-10-26 NOTE — Telephone Encounter (Signed)
Ok for the levocetirizine since it is generic then - to robin to send to [facility], and take clarinex off the med list

## 2011-10-26 NOTE — Telephone Encounter (Signed)
Patients wife stated he takes both levocetirizine and clarinex, they work well for him and would like to continue on both medications

## 2011-10-26 NOTE — Telephone Encounter (Signed)
Called the patients wife and she informed both work well for the patient. Called the pharmacy and informed as well.

## 2011-10-28 ENCOUNTER — Encounter: Payer: Self-pay | Admitting: Internal Medicine

## 2011-10-28 ENCOUNTER — Ambulatory Visit (INDEPENDENT_AMBULATORY_CARE_PROVIDER_SITE_OTHER): Payer: Medicare Other | Admitting: Internal Medicine

## 2011-10-28 VITALS — BP 124/62 | HR 61 | Temp 98.2°F | Ht 69.0 in | Wt 224.2 lb

## 2011-10-28 DIAGNOSIS — J811 Chronic pulmonary edema: Secondary | ICD-10-CM

## 2011-10-28 DIAGNOSIS — I10 Essential (primary) hypertension: Secondary | ICD-10-CM

## 2011-10-28 DIAGNOSIS — J189 Pneumonia, unspecified organism: Secondary | ICD-10-CM

## 2011-10-28 DIAGNOSIS — I4891 Unspecified atrial fibrillation: Secondary | ICD-10-CM

## 2011-10-28 MED ORDER — BUPROPION HCL ER (XL) 150 MG PO TB24: 150.0000 mg | ORAL_TABLET | Freq: Every day | ORAL | Status: DC

## 2011-10-28 MED ORDER — NYSTATIN 100000 UNIT/GM EX POWD: CUTANEOUS | Status: DC

## 2011-10-28 MED ORDER — DESLORATADINE 5 MG PO TABS: 5.0000 mg | ORAL_TABLET | Freq: Every day | ORAL | Status: DC

## 2011-10-28 NOTE — Patient Instructions (Addendum)
Please continue your Physical therapy as you are doing Please keep your appointments with your specialists as you have planned - Dr Richardean Chimera for memory Take all new medications as prescribed - the powder for the rash OK to stop the lasix and the potassium Continue all other medications as before

## 2011-10-28 NOTE — Progress Notes (Signed)
Subjective:    Patient ID: [name] [name], male    DOB: 1935/08/06, 76 y.o.   MRN: [id number]  HPI  Here to f/u after 8 day hospn, and 10 day ST rehab, for acute resp failure,  Pneumonia and sepsis, and pulm edema on initial CXR only nov 13, none after that but lasix simply cont'd from the ER tx forward until today, now on cont'd coumadin, also lasix and K.  She is worred b/c she tries to limit to the Vit K for him and the potasiium bottle has K on it.  Chart review indicates echo with normal EF oct 2011, no diast dysfunction mentioned.  Pt denies chest pain, increased sob or doe, wheezing, orthopnea, PND, increased LE swelling, palpitations, dizziness or syncope.  Pt denies new neurological symptoms such as new headache, or facial or extremity weakness or numbness   Pt denies polydipsia, polyuria.   Pt denies fever, wt loss, night sweats, loss of appetite, or other constitutional symptoms since being home.  Has jan 3 appt  In f/u with ortho for lumbar stenosis. Requests nystatin powder for groin rash after recent antibx.  Sees neuro/Dr Dohmeir ongoing for memory loss/dementia Past Medical History  Diagnosis Date  . ONYCHOMYCOSIS, TOENAILS 06/27/2008  . HYPERLIPIDEMIA 07/28/2007  . ANXIETY 09/28/2007  . OBSTRUCTIVE SLEEP APNEA 01/01/2008  . RESTLESS LEGS SYNDROME 12/26/2007  . HEARING LOSS 06/27/2008  . HYPERTENSION 07/28/2007  . MYOCARDIAL INFARCTION, HX OF 07/28/2007  . CORONARY ARTERY DISEASE 07/28/2007  . Atrial fibrillation 09/28/2007  . SINUSITIS- ACUTE-NOS 10/10/2007  . ALLERGIC RHINITIS 09/28/2007  . CHOLELITHIASIS 09/28/2007  . BENIGN PROSTATIC HYPERTROPHY 09/28/2007  . CELLULITIS, HAND, LEFT 09/24/2010  . SYNCOPE 07/28/2007  . INSOMNIA-SLEEP DISORDER-UNSPEC 04/03/2009  . HYPERSOMNIA WITH SLEEP APNEA UNSPECIFIED 12/26/2007  . FATIGUE 01/01/2008  . DYSPNEA 04/03/2009  . Wheezing 11/06/2009  . ANGIOEDEMA 11/06/2009  . COLON CANCER, HX OF 07/28/2007  . PROSTATE CANCER, HX OF 07/28/2007  . COLONIC  POLYPS, HX OF 07/28/2007  . Long term current use of anticoagulant 12/22/2010  . Asthma   . DEMENTIA   . Spinal stenosis   . History of atrial fibrillation    Past Surgical History  Procedure Date  . Prostatectomy 2001  . Cardioversion 2007  . Stress cardiolite 02/08/2007  . Schocardiograph 10/07/2006  . Right thyroid lobectomy   . Coronary artery bypass graft 1995    x 3  . Hernia repair 1998  . Colonoscopy 2010    last done 2010- clear    reports that he has never smoked. He does not have any smokeless tobacco history on file. He reports that he does not drink alcohol or use illicit drugs. family history includes Coronary artery disease in his father and sister and Hypertension in his other. Allergies  Allergen Reactions  . Lisinopril Swelling   Current Outpatient Prescriptions on File Prior to Visit  Medication Sig Dispense Refill  . albuterol (PROAIR HFA) 108 (90 BASE) MCG/ACT inhaler Inhale 2 puffs into the lungs every 4 (four) hours as needed. For wheezing  3 Inhaler  3  . ALPRAZolam (XANAX) 0.5 MG tablet Take 0.5 mg by mouth 3 (three) times daily as needed. For anxiety       . amLODipine (NORVASC) 5 MG tablet Take 1 tablet (5 mg total) by mouth daily.  90 tablet  3  . atenolol (TENORMIN) 50 MG tablet Take 1 tablet (50 mg total) by mouth daily.  90 tablet  3  . atorvastatin (LIPITOR)  40 MG tablet Take 1 tablet (40 mg total) by mouth daily.  90 tablet  3  . Cholecalciferol (VITAMIN D3) 1000 UNITS CAPS Take by mouth daily.        . clonazePAM (KLONOPIN) 1 MG tablet Take 1 tablet (1 mg total) by mouth at bedtime as needed for anxiety.  30 tablet  5  . diphenhydrAMINE (BENADRYL) 25 mg capsule Take 25 mg by mouth every 6 (six) hours as needed. For itching       . docusate sodium (COLACE) 100 MG capsule Take 100 mg by mouth 2 (two) times daily.        Marland Kitchen donepezil (ARICEPT) 10 MG tablet Take 10 mg by mouth daily.        Marland Kitchen FLUoxetine (PROZAC) 40 MG capsule Take 1 capsule (40 mg total)  by mouth daily.  90 capsule  3  . fluticasone (VERAMYST) 27.5 MCG/SPRAY nasal spray Place 2 sprays into the nose daily.  10 g  3  . Fluticasone-Salmeterol (ADVAIR) 250-50 MCG/DOSE AEPB Inhale 1 puff into the lungs daily.        . furosemide (LASIX) 40 MG tablet Take 1 tablet (40 mg total) by mouth daily.  30 tablet  0  . guaifenesin (HUMIBID E) 400 MG TABS Take 400 mg by mouth every 4 (four) hours.        Marland Kitchen l-methylfolate-b2-b6-b12 (CEREFOLIN) 04-08-49-5 MG TABS Take 1 tablet by mouth daily.  90 tablet  3  . levocetirizine (XYZAL) 5 MG tablet TAKE 1 TABLET EVERY EVENING  90 tablet  2  . Multiple Vitamin (MULTIVITAMINS PO) Take by mouth daily.        . potassium chloride (K-DUR) 10 MEQ tablet Take 2 tablets (20 mEq total) by mouth daily.  30 tablet  0  . senna (SENOKOT) 8.6 MG TABS Take 2 tablets (17.2 mg total) by mouth daily as needed.  120 each  0  . solifenacin (VESICARE) 5 MG tablet Take 5 mg by mouth daily.        Marland Kitchen warfarin (COUMADIN) 5 MG tablet Take 5-7.5 mg by mouth daily. Take 5 mg on Saturday, Tuesday, Thursday and Saturday then take 7.5 mg on Monday Wednesday and Friday       Review of Systems Review of Systems  Constitutional: Negative for diaphoresis and unexpected weight change.  HENT: Negative for drooling and tinnitus.   Eyes: Negative for photophobia and visual disturbance.  Respiratory: Negative for choking and stridor.   Gastrointestinal: Negative for vomiting and blood in stool.  Genitourinary: Negative for hematuria and decreased urine volume.  Musculoskeletal: Negative for gait problem.  Skin: Negative for color change and wound.      Objective:   Physical Exam BP 124/62  Pulse 61  Temp(Src) 98.2 F (36.8 C) (Oral)  Ht 5\' 9"  (1.753 m)  Wt 224 lb 4 oz (101.719 kg)  BMI 33.12 kg/m2  SpO2 95% Physical Exam  VS noted, not ill appearing, mild orthostatic today noted on extended VS Constitutional: Pt appears well-developed and well-nourished.  HENT: Head:  Normocephalic.  Right Ear: External ear normal.  Left Ear: External ear normal.  Eyes: Conjunctivae and EOM are normal. Pupils are equal, round, and reactive to light.  Neck: Normal range of motion. Neck supple.  Cardiovascular: Normal rate and regular rhythm. - not irregular today   Pulmonary/Chest: Effort normal and breath sounds normal.  Abd:  Soft, NT, non-distended, + BS Neurological: Pt is alert. No cranial nerve deficit.  Skin: Skin  is warm. No erythema.  Psychiatric: Pt behavior is normal. Thought content at baseline dementia    Assessment & Plan:

## 2011-10-30 ENCOUNTER — Encounter: Payer: Self-pay | Admitting: Internal Medicine

## 2011-10-30 DIAGNOSIS — M48061 Spinal stenosis, lumbar region without neurogenic claudication: Secondary | ICD-10-CM

## 2011-10-30 HISTORY — DX: Spinal stenosis, lumbar region without neurogenic claudication: M48.061

## 2011-10-30 NOTE — Assessment & Plan Note (Signed)
stable overall by hx and exam, most recent data reviewed with pt, and pt to continue medical treatment as before  BP Readings from Last 3 Encounters:  10/28/11 124/62  09/27/11 156/76  07/14/11 112/68

## 2011-10-30 NOTE — Assessment & Plan Note (Signed)
Resolved, wife reassured, no further eval or tx needed at this time

## 2011-10-30 NOTE — Assessment & Plan Note (Signed)
Sinus by exam today, Continue all other medications as before

## 2011-10-30 NOTE — Assessment & Plan Note (Signed)
On initial cxr only nov 13 with pna/sepsis, now mild orthostatic today on extended VS - to d/c lasix/k,  to f/u any worsening symptoms or concerns, doubt need for ongoing diuretic

## 2011-11-03 ENCOUNTER — Ambulatory Visit (INDEPENDENT_AMBULATORY_CARE_PROVIDER_SITE_OTHER): Payer: Self-pay | Admitting: Cardiovascular Disease

## 2011-11-03 DIAGNOSIS — R0989 Other specified symptoms and signs involving the circulatory and respiratory systems: Secondary | ICD-10-CM

## 2011-11-03 DIAGNOSIS — I4891 Unspecified atrial fibrillation: Secondary | ICD-10-CM

## 2011-11-03 DIAGNOSIS — Z7901 Long term (current) use of anticoagulants: Secondary | ICD-10-CM

## 2011-11-03 LAB — POCT INR: INR: 3.3

## 2011-11-11 DIAGNOSIS — M48061 Spinal stenosis, lumbar region without neurogenic claudication: Secondary | ICD-10-CM | POA: Diagnosis not present

## 2011-11-11 DIAGNOSIS — M19019 Primary osteoarthritis, unspecified shoulder: Secondary | ICD-10-CM | POA: Diagnosis not present

## 2011-11-15 ENCOUNTER — Ambulatory Visit (INDEPENDENT_AMBULATORY_CARE_PROVIDER_SITE_OTHER): Payer: Medicare Other | Admitting: *Deleted

## 2011-11-15 DIAGNOSIS — I4891 Unspecified atrial fibrillation: Secondary | ICD-10-CM

## 2011-11-15 DIAGNOSIS — Z7901 Long term (current) use of anticoagulants: Secondary | ICD-10-CM | POA: Diagnosis not present

## 2011-11-24 DIAGNOSIS — R269 Unspecified abnormalities of gait and mobility: Secondary | ICD-10-CM | POA: Diagnosis not present

## 2011-11-24 DIAGNOSIS — M48061 Spinal stenosis, lumbar region without neurogenic claudication: Secondary | ICD-10-CM | POA: Diagnosis not present

## 2011-11-24 DIAGNOSIS — F0281 Dementia in other diseases classified elsewhere with behavioral disturbance: Secondary | ICD-10-CM | POA: Diagnosis not present

## 2011-11-24 DIAGNOSIS — G4710 Hypersomnia, unspecified: Secondary | ICD-10-CM | POA: Diagnosis not present

## 2011-11-25 ENCOUNTER — Telehealth: Payer: Self-pay | Admitting: Cardiology

## 2011-11-25 NOTE — Telephone Encounter (Addendum)
pt have back surgery and needs to know if can get clearance and what to do re coumadin, pls call

## 2011-11-26 NOTE — Telephone Encounter (Signed)
Pt scheduled to see Dr. Antoine Poche on 12/02/11

## 2011-11-29 ENCOUNTER — Encounter: Payer: Medicare Other | Admitting: *Deleted

## 2011-12-02 ENCOUNTER — Ambulatory Visit (INDEPENDENT_AMBULATORY_CARE_PROVIDER_SITE_OTHER): Payer: Medicare Other | Admitting: Cardiology

## 2011-12-02 ENCOUNTER — Encounter: Payer: Self-pay | Admitting: Cardiology

## 2011-12-02 ENCOUNTER — Ambulatory Visit (INDEPENDENT_AMBULATORY_CARE_PROVIDER_SITE_OTHER): Payer: Medicare Other | Admitting: *Deleted

## 2011-12-02 DIAGNOSIS — Z01810 Encounter for preprocedural cardiovascular examination: Secondary | ICD-10-CM | POA: Diagnosis not present

## 2011-12-02 DIAGNOSIS — Z7901 Long term (current) use of anticoagulants: Secondary | ICD-10-CM

## 2011-12-02 DIAGNOSIS — I4891 Unspecified atrial fibrillation: Secondary | ICD-10-CM

## 2011-12-02 DIAGNOSIS — I2510 Atherosclerotic heart disease of native coronary artery without angina pectoris: Secondary | ICD-10-CM | POA: Diagnosis not present

## 2011-12-02 DIAGNOSIS — I10 Essential (primary) hypertension: Secondary | ICD-10-CM

## 2011-12-02 DIAGNOSIS — Z01818 Encounter for other preprocedural examination: Secondary | ICD-10-CM | POA: Insufficient documentation

## 2011-12-02 LAB — POCT INR: INR: 2.7

## 2011-12-02 NOTE — Assessment & Plan Note (Addendum)
He has had no symptomatic recurrence of this. No change in therapy is indicated.  Of note he could come off of Coumadin without bridging Lovenox as needed for his procedure.

## 2011-12-02 NOTE — Patient Instructions (Signed)
Continue current medications as listed.  Your physician has requested that you have a lexiscan myoview. For further information please visit [email or web address] Please follow instruction sheet, as given.  Follow up will be based on these results

## 2011-12-02 NOTE — Assessment & Plan Note (Signed)
The patient is going for a procedure that is somewhat moderate risk particularly with the rehabilitation. He has a low functional level. He has not had cardiovascular testing in some time. Given this, according to ACC/AHA guidelines preoperative stress testing is indicated. He would not be a candidate and so he will need Lexiscan Myoview.

## 2011-12-02 NOTE — Progress Notes (Signed)
HPI The patient presents for evaluation prior to a possible back surgery. He has a history of coronary disease as described below. His last catheterization was in 2008. His last stress test was 2007. He has had atrial fibrillation with his last cardioversion in 2011. He's not had any symptomatic paroxysms of this. He is somewhat limited. He gets around slowly but is limited with back pain. He denies any chest pressure, neck or arm discomfort. He's not having any palpitations, presyncope or syncope. He's had no PND or orthopnea. He did have a pneumonia and was hospitalized late last year and had an extended rehabilitation.  Allergies  Allergen Reactions  . Lisinopril Swelling    Current Outpatient Prescriptions  Medication Sig Dispense Refill  . albuterol (PROAIR HFA) 108 (90 BASE) MCG/ACT inhaler Inhale 2 puffs into the lungs every 4 (four) hours as needed. For wheezing  3 Inhaler  3  . ALPRAZolam (XANAX) 0.5 MG tablet Take 0.5 mg by mouth 3 (three) times daily as needed. For anxiety       . amLODipine (NORVASC) 5 MG tablet Take 1 tablet (5 mg total) by mouth daily.  90 tablet  3  . atenolol (TENORMIN) 50 MG tablet Take 1 tablet (50 mg total) by mouth daily.  90 tablet  3  . atorvastatin (LIPITOR) 40 MG tablet Take 1 tablet (40 mg total) by mouth daily.  90 tablet  3  . buPROPion (WELLBUTRIN XL) 150 MG 24 hr tablet Take 1 tablet (150 mg total) by mouth daily.  90 tablet  3  . Cholecalciferol (VITAMIN D3) 1000 UNITS CAPS Take by mouth daily.        . clonazePAM (KLONOPIN) 1 MG tablet Take 1 tablet (1 mg total) by mouth at bedtime as needed for anxiety.  30 tablet  5  . desloratadine (CLARINEX) 5 MG tablet Take 1 tablet (5 mg total) by mouth daily.  90 tablet  3  . docusate sodium (COLACE) 100 MG capsule Take 100 mg by mouth 2 (two) times daily.        Marland Kitchen donepezil (ARICEPT) 10 MG tablet Take 10 mg by mouth daily. 23 mg once daily       . FLUoxetine (PROZAC) 40 MG capsule Take 1 capsule (40 mg  total) by mouth daily.  90 capsule  3  . fluticasone (VERAMYST) 27.5 MCG/SPRAY nasal spray Place 2 sprays into the nose daily.  10 g  3  . Fluticasone-Salmeterol (ADVAIR) 250-50 MCG/DOSE AEPB Inhale 1 puff into the lungs daily.        Marland Kitchen guaifenesin (HUMIBID E) 400 MG TABS Take 400 mg by mouth every 4 (four) hours.        Marland Kitchen l-methylfolate-b2-b6-b12 (CEREFOLIN) 04-08-49-5 MG TABS Take 1 tablet by mouth daily.  90 tablet  3  . levocetirizine (XYZAL) 5 MG tablet TAKE 1 TABLET EVERY EVENING  90 tablet  2  . Multiple Vitamin (MULTIVITAMINS PO) Take by mouth daily.        Marland Kitchen nystatin (MYCOSTATIN) powder Use as directed twice per day as needed  15 g  1  . primidone (MYSOLINE) 50 MG tablet Take 50 mg by mouth 2 (two) times daily.       . solifenacin (VESICARE) 5 MG tablet Take 5 mg by mouth daily.        Marland Kitchen warfarin (COUMADIN) 5 MG tablet Take 5-7.5 mg by mouth daily. Take 5 mg on Saturday, Tuesday, Wednesday,  Thursday and Sunday then take 7.5 mg  on Monday  and Friday        Past Medical History  Diagnosis Date  . ONYCHOMYCOSIS, TOENAILS 06/27/2008  . HYPERLIPIDEMIA 07/28/2007  . ANXIETY 09/28/2007  . OBSTRUCTIVE SLEEP APNEA 01/01/2008  . RESTLESS LEGS SYNDROME 12/26/2007  . HEARING LOSS 06/27/2008  . HYPERTENSION 07/28/2007  . MYOCARDIAL INFARCTION, HX OF 07/28/2007  . CORONARY ARTERY DISEASE 07/28/2007  . Atrial fibrillation 09/28/2007  . SINUSITIS- ACUTE-NOS 10/10/2007  . ALLERGIC RHINITIS 09/28/2007  . CHOLELITHIASIS 09/28/2007  . BENIGN PROSTATIC HYPERTROPHY 09/28/2007  . CELLULITIS, HAND, LEFT 09/24/2010  . SYNCOPE 07/28/2007  . INSOMNIA-SLEEP DISORDER-UNSPEC 04/03/2009  . HYPERSOMNIA WITH SLEEP APNEA UNSPECIFIED 12/26/2007  . FATIGUE 01/01/2008  . DYSPNEA 04/03/2009  . Wheezing 11/06/2009  . ANGIOEDEMA 11/06/2009  . COLON CANCER, HX OF 07/28/2007  . PROSTATE CANCER, HX OF 07/28/2007  . COLONIC POLYPS, HX OF 07/28/2007  . Long term current use of anticoagulant 12/22/2010  . Asthma   . DEMENTIA   .  Spinal stenosis   . History of atrial fibrillation   . Lumbar stenosis 10/30/2011  . CAD (coronary artery disease)     Severe three-vessel coronary disease. LIMA to the LAD patent. SVG to PDA patent. SVG to OM 3 patent. Preserved ejection fraction. 2008.    Past Surgical History  Procedure Date  . Prostatectomy 2001  . Cardioversion 2007  . Stress cardiolite 02/08/2007  . Schocardiograph 10/07/2006  . Right thyroid lobectomy   . Coronary artery bypass graft 1995    x 3  . Hernia repair 1998  . Colonoscopy 2010    last done 2010- clear    ROS: As stated in the HPI and negative for all other systems.  PHYSICAL EXAM BP 130/75  Pulse 80  Ht 5\' 9"  (1.753 m)  Wt 224 lb 6.4 oz (101.787 kg)  BMI 33.14 kg/m2 GENERAL:  Chronically ill appearing. HEENT:  Pupils equal round and reactive, fundi not visualized, oral mucosa unremarkable NECK:  No jugular venous distention, waveform within normal limits, carotid upstroke brisk and symmetric, no bruits, no thyromegaly LYMPHATICS:  No cervical, inguinal adenopathy LUNGS:  Clear to auscultation bilaterally BACK:  No CVA tenderness CHEST:  Well healed sternotomy scar. HEART:  PMI not displaced or sustained,S1 and S2 within normal limits, no S3, no S4, no clicks, no rubs, no murmurs ABD:  Flat, positive bowel sounds normal in frequency in pitch, no bruits, no rebound, no guarding, no midline pulsatile mass, no hepatomegaly, no splenomegaly EXT:  2 plus pulses throughout, no edema, no cyanosis no clubbing SKIN:  No rashes no nodules NEURO:  Cranial nerves II through XII grossly intact, motor grossly intact throughout PSYCH:  Cognitively intact, oriented to person place and time  EKG:  09/21/11  Rate 67, LAD, no acute ST T wave changes.  ASSESSMENT AND PLAN

## 2011-12-02 NOTE — Assessment & Plan Note (Signed)
The blood pressure is at target. No change in medications is indicated. We will continue with therapeutic lifestyle changes (TLC).  

## 2011-12-06 ENCOUNTER — Telehealth: Payer: Self-pay

## 2011-12-06 NOTE — Telephone Encounter (Signed)
This is his usual tx - xanax during day, klonopin for night use

## 2011-12-06 NOTE — Telephone Encounter (Signed)
Pharmacy requesting clarification stating that Xanax creates duplicate therapy with the patients  Medication Clonazepam 1 mg. Please advise

## 2011-12-06 NOTE — Telephone Encounter (Signed)
Pharmacy informed of MD's instructions 

## 2011-12-07 DIAGNOSIS — M47817 Spondylosis without myelopathy or radiculopathy, lumbosacral region: Secondary | ICD-10-CM | POA: Diagnosis not present

## 2011-12-09 ENCOUNTER — Ambulatory Visit (HOSPITAL_COMMUNITY): Payer: Medicare Other | Attending: Cardiovascular Disease | Admitting: Radiology

## 2011-12-09 DIAGNOSIS — E785 Hyperlipidemia, unspecified: Secondary | ICD-10-CM | POA: Insufficient documentation

## 2011-12-09 DIAGNOSIS — R0989 Other specified symptoms and signs involving the circulatory and respiratory systems: Secondary | ICD-10-CM | POA: Insufficient documentation

## 2011-12-09 DIAGNOSIS — I10 Essential (primary) hypertension: Secondary | ICD-10-CM | POA: Diagnosis not present

## 2011-12-09 DIAGNOSIS — R0602 Shortness of breath: Secondary | ICD-10-CM

## 2011-12-09 DIAGNOSIS — Z951 Presence of aortocoronary bypass graft: Secondary | ICD-10-CM | POA: Insufficient documentation

## 2011-12-09 DIAGNOSIS — R0609 Other forms of dyspnea: Secondary | ICD-10-CM | POA: Insufficient documentation

## 2011-12-09 DIAGNOSIS — Z9861 Coronary angioplasty status: Secondary | ICD-10-CM | POA: Diagnosis not present

## 2011-12-09 DIAGNOSIS — I252 Old myocardial infarction: Secondary | ICD-10-CM | POA: Insufficient documentation

## 2011-12-09 DIAGNOSIS — Z8249 Family history of ischemic heart disease and other diseases of the circulatory system: Secondary | ICD-10-CM | POA: Insufficient documentation

## 2011-12-09 DIAGNOSIS — R0789 Other chest pain: Secondary | ICD-10-CM | POA: Insufficient documentation

## 2011-12-09 DIAGNOSIS — Z01810 Encounter for preprocedural cardiovascular examination: Secondary | ICD-10-CM | POA: Diagnosis not present

## 2011-12-09 DIAGNOSIS — I2510 Atherosclerotic heart disease of native coronary artery without angina pectoris: Secondary | ICD-10-CM

## 2011-12-09 DIAGNOSIS — R079 Chest pain, unspecified: Secondary | ICD-10-CM | POA: Diagnosis not present

## 2011-12-09 DIAGNOSIS — R5381 Other malaise: Secondary | ICD-10-CM | POA: Diagnosis not present

## 2011-12-09 MED ORDER — TECHNETIUM TC 99M TETROFOSMIN IV KIT
11.0000 | PACK | Freq: Once | INTRAVENOUS | Status: AC | PRN
  Administered 2011-12-09: 11 via INTRAVENOUS

## 2011-12-09 MED ORDER — REGADENOSON 0.4 MG/5ML IV SOLN
0.4000 mg | Freq: Once | INTRAVENOUS | Status: AC
  Administered 2011-12-09: 0.4 mg via INTRAVENOUS

## 2011-12-09 MED ORDER — TECHNETIUM TC 99M TETROFOSMIN IV KIT
33.0000 | PACK | Freq: Once | INTRAVENOUS | Status: AC | PRN
  Administered 2011-12-09: 33 via INTRAVENOUS

## 2011-12-09 NOTE — Progress Notes (Signed)
[facility] SITE 3 NUCLEAR MED [address] [place] [address] [phone]  Cardiology Nuclear Med Study  [name] [name] is a 77 y.o. male [id number] [birth year]   Nuclear Med Background Indication for Stress Test:  Evaluation for Ischemia, Pending Surgical Clearance: Back surgery- Dr. Estill Bamberg, and Graft Patency History:  1995 Angioplasty- RCA, 1995 CABG, 1995 Myocardial Infarction, 2011 Cardioversion, 2011 Echo-EF 55-60%, 2008 Heart Catheterization-patent grafts EF 65%, and 2008 Myocardial Perfusion Study EF 57%- mild distal anterior ischemia Cardiac Risk Factors: Family History - CAD, Hypertension and Lipids  Symptoms:  Chest Pressure.  (last date of chest discomfort 2 days ago), DOE, Fatigue and Fatigue with Exertion   Nuclear Pre-Procedure Caffeine/Decaff Intake:  None NPO After: 10:00pm   Lungs:  Clear IV 0.9% NS with Angio Cath:  22g  IV Site: R Hand  IV Started by:  Stanton Kidney, EMT-P  Chest Size (in):  46 Cup Size: n/a  Height: 5\' 9"  (1.753 m)  Weight:  225 lb (102.059 kg)  BMI:  Body mass index is 33.23 kg/(m^2). Tech Comments:  NA    Nuclear Med Study 1 or 2 day study: 1 day  Stress Test Type:  Lexiscan  Reading MD: Charlton Haws, MD  Order Authorizing Provider:  Rollene Rotunda, MD  Resting Radionuclide: Technetium 57m Tetrofosmin  Resting Radionuclide Dose: 11 mCi   Stress Radionuclide:  Technetium 40m Tetrofosmin  Stress Radionuclide Dose: 33 mCi           Stress Protocol Rest HR: 57 Stress HR: 60  Rest BP: 123/93 Stress BP: 135/61  Exercise Time (min): n/a METS: n/a   Predicted Max HR: 146 bpm % Max HR: 53.42 bpm Rate Pressure Product: 24401   Dose of Adenosine (mg):  n/a Dose of Lexiscan: 0.4 mg  Dose of Atropine (mg): n/a Dose of Dobutamine: n/a mcg/kg/min (at max HR)  Stress Test Technologist: Bonnita Levan, RN  Nuclear Technologist:  Domenic Polite, CNMT     Rest Procedure:  Myocardial perfusion imaging was performed at rest  45 minutes following the intravenous administration of Technetium 90m Tetrofosmin. Rest ECG: Sinus Bradycardia  Stress Procedure:  The patient received IV Lexiscan 0.4 mg over 15-seconds.  Technetium 7m Tetrofosmin injected at 30-seconds.  There were no significant changes with Lexiscan.  Quantitative spect images were obtained after a 45 minute delay. Stress ECG: No significant change from baseline ECG  QPS Raw Data Images:  Normal; no motion artifact; normal heart/lung ratio. Stress Images:  Normal homogeneous uptake in all areas of the myocardium. Rest Images:  Normal homogeneous uptake in all areas of the myocardium. Subtraction (SDS):  Normal Transient Ischemic Dilatation (Normal <1.22):  .89 Lung/Heart Ratio (Normal <0.45):  .23  Quantitative Gated Spect Images QGS EDV:  120 ml QGS ESV:  40 ml QGS cine images:  NL LV Function; NL Wall Motion QGS EF: 59%  Impression Exercise Capacity:  Lexiscan with no exercise. BP Response:  Normal blood pressure response. Clinical Symptoms:  No chest pain. ECG Impression:  No significant ST segment change suggestive of ischemia. Comparison with Prior Nuclear Study: No images to compare  Overall Impression:  Normal stress nuclear study.  Significant RV enlargement    Charlton Haws

## 2011-12-13 ENCOUNTER — Emergency Department (INDEPENDENT_AMBULATORY_CARE_PROVIDER_SITE_OTHER)
Admission: EM | Admit: 2011-12-13 | Discharge: 2011-12-13 | Disposition: A | Discharge status: Nursing Home (Includes ICF) | Payer: Medicare Other | Source: Home / Self Care

## 2011-12-13 ENCOUNTER — Encounter (HOSPITAL_COMMUNITY): Payer: Self-pay | Admitting: Emergency Medicine

## 2011-12-13 ENCOUNTER — Encounter (HOSPITAL_COMMUNITY): Payer: Self-pay | Admitting: *Deleted

## 2011-12-13 ENCOUNTER — Emergency Department (HOSPITAL_COMMUNITY)
Admission: EM | Admit: 2011-12-13 | Discharge: 2011-12-13 | Disposition: A | Discharge status: Home / Self Care | Payer: Medicare Other | Attending: Emergency Medicine | Admitting: Emergency Medicine

## 2011-12-13 ENCOUNTER — Emergency Department (HOSPITAL_COMMUNITY): Payer: Medicare Other

## 2011-12-13 DIAGNOSIS — S0990XA Unspecified injury of head, initial encounter: Secondary | ICD-10-CM

## 2011-12-13 DIAGNOSIS — S0100XA Unspecified open wound of scalp, initial encounter: Secondary | ICD-10-CM | POA: Insufficient documentation

## 2011-12-13 DIAGNOSIS — Z7901 Long term (current) use of anticoagulants: Secondary | ICD-10-CM | POA: Insufficient documentation

## 2011-12-13 DIAGNOSIS — I252 Old myocardial infarction: Secondary | ICD-10-CM | POA: Insufficient documentation

## 2011-12-13 DIAGNOSIS — M48061 Spinal stenosis, lumbar region without neurogenic claudication: Secondary | ICD-10-CM | POA: Diagnosis not present

## 2011-12-13 DIAGNOSIS — S0101XA Laceration without foreign body of scalp, initial encounter: Secondary | ICD-10-CM

## 2011-12-13 DIAGNOSIS — E785 Hyperlipidemia, unspecified: Secondary | ICD-10-CM | POA: Insufficient documentation

## 2011-12-13 DIAGNOSIS — R51 Headache: Secondary | ICD-10-CM | POA: Diagnosis not present

## 2011-12-13 DIAGNOSIS — I2510 Atherosclerotic heart disease of native coronary artery without angina pectoris: Secondary | ICD-10-CM | POA: Insufficient documentation

## 2011-12-13 DIAGNOSIS — G4700 Insomnia, unspecified: Secondary | ICD-10-CM | POA: Diagnosis not present

## 2011-12-13 DIAGNOSIS — M542 Cervicalgia: Secondary | ICD-10-CM | POA: Diagnosis not present

## 2011-12-13 DIAGNOSIS — I10 Essential (primary) hypertension: Secondary | ICD-10-CM | POA: Insufficient documentation

## 2011-12-13 DIAGNOSIS — F411 Generalized anxiety disorder: Secondary | ICD-10-CM | POA: Diagnosis not present

## 2011-12-13 DIAGNOSIS — I4891 Unspecified atrial fibrillation: Secondary | ICD-10-CM | POA: Insufficient documentation

## 2011-12-13 DIAGNOSIS — G2581 Restless legs syndrome: Secondary | ICD-10-CM | POA: Insufficient documentation

## 2011-12-13 DIAGNOSIS — J45909 Unspecified asthma, uncomplicated: Secondary | ICD-10-CM | POA: Insufficient documentation

## 2011-12-13 DIAGNOSIS — G4733 Obstructive sleep apnea (adult) (pediatric): Secondary | ICD-10-CM | POA: Insufficient documentation

## 2011-12-13 DIAGNOSIS — W1809XA Striking against other object with subsequent fall, initial encounter: Secondary | ICD-10-CM | POA: Insufficient documentation

## 2011-12-13 HISTORY — DX: Unspecified osteoarthritis, unspecified site: M19.90

## 2011-12-13 HISTORY — DX: Disorder of thyroid, unspecified: E07.9

## 2011-12-13 HISTORY — DX: Pneumonia, unspecified organism: J18.9

## 2011-12-13 HISTORY — DX: Malignant (primary) neoplasm, unspecified: C80.1

## 2011-12-13 MED ORDER — HYDROCODONE-ACETAMINOPHEN 5-325 MG PO TABS
1.0000 | ORAL_TABLET | Freq: Once | ORAL | Status: AC
  Administered 2011-12-13: 1 via ORAL
  Filled 2011-12-13: qty 1

## 2011-12-13 MED ORDER — TETANUS-DIPHTHERIA TOXOIDS TD 5-2 LFU IM INJ
0.5000 mL | INJECTION | Freq: Once | INTRAMUSCULAR | Status: AC
  Administered 2011-12-13: 0.5 mL via INTRAMUSCULAR
  Filled 2011-12-13: qty 0.5

## 2011-12-13 NOTE — ED Provider Notes (Signed)
History     CSN: [id number]  Arrival date & time 12/13/11  1713   None     Chief Complaint  Patient presents with  . Head Laceration  . Fall    (Consider location/radiation/quality/duration/timing/severity/associated sxs/prior treatment) HPI Comments: Patient presents this evening with his wife with head injury. He was getting out of the car at the chiropractors office when he stumbled and fell, hitting his head on a cement wall. He takes Warfarin daily. He denies HA , dizziness or visual changes. He states he saw stars when he fell , but denies LOC. He has some achey muscular neck discomfort since the fall, in addition to wound on back of the head.    Past Medical History  Diagnosis Date  . ONYCHOMYCOSIS, TOENAILS 06/27/2008  . HYPERLIPIDEMIA 07/28/2007  . ANXIETY 09/28/2007  . OBSTRUCTIVE SLEEP APNEA 01/01/2008  . RESTLESS LEGS SYNDROME 12/26/2007  . HEARING LOSS 06/27/2008  . HYPERTENSION 07/28/2007  . MYOCARDIAL INFARCTION, HX OF 07/28/2007  . CORONARY ARTERY DISEASE 07/28/2007  . Atrial fibrillation 09/28/2007  . SINUSITIS- ACUTE-NOS 10/10/2007  . ALLERGIC RHINITIS 09/28/2007  . CHOLELITHIASIS 09/28/2007  . BENIGN PROSTATIC HYPERTROPHY 09/28/2007  . CELLULITIS, HAND, LEFT 09/24/2010  . SYNCOPE 07/28/2007  . INSOMNIA-SLEEP DISORDER-UNSPEC 04/03/2009  . HYPERSOMNIA WITH SLEEP APNEA UNSPECIFIED 12/26/2007  . FATIGUE 01/01/2008  . DYSPNEA 04/03/2009  . Wheezing 11/06/2009  . ANGIOEDEMA 11/06/2009  . COLON CANCER, HX OF 07/28/2007  . PROSTATE CANCER, HX OF 07/28/2007  . COLONIC POLYPS, HX OF 07/28/2007  . Long term current use of anticoagulant 12/22/2010  . Asthma   . DEMENTIA   . Spinal stenosis   . History of atrial fibrillation   . Lumbar stenosis 10/30/2011  . CAD (coronary artery disease)     Severe three-vessel coronary disease. LIMA to the LAD patent. SVG to PDA patent. SVG to OM 3 patent. Preserved ejection fraction. 2008.  . Cancer   . Prostate cancer   . Colon cancer     . Thyroid disease   . Arthritis   . Pneumonia     Past Surgical History  Procedure Date  . Prostatectomy 2001  . Cardioversion 2007  . Stress cardiolite 02/08/2007  . Schocardiograph 10/07/2006  . Right thyroid lobectomy   . Coronary artery bypass graft 1995    x 3  . Hernia repair 1998  . Colonoscopy 2010    last done 2010- clear  . Cardiac electrophysiology study and ablation   . Thyroidectomy, partial     Family History  Problem Relation Age of Onset  . Coronary artery disease Father     before 29 yo  . Coronary artery disease Sister     before 49 yo  . Hypertension Other     History  Substance Use Topics  . Smoking status: Never Smoker   . Smokeless tobacco: Not on file  . Alcohol Use: No      Review of Systems  Eyes: Negative for visual disturbance.  Gastrointestinal: Negative for nausea and vomiting.  Musculoskeletal: Negative for back pain.  Skin: Positive for wound.  Neurological: Negative for dizziness, numbness and headaches.    Allergies  Lisinopril  Home Medications   Current Outpatient Rx  Name Route Sig Dispense Refill  . ALBUTEROL SULFATE HFA 108 (90 BASE) MCG/ACT IN AERS Inhalation Inhale 2 puffs into the lungs every 4 (four) hours as needed. For wheezing 3 Inhaler 3  . ALPRAZOLAM 0.5 MG PO TABS Oral Take 0.5 mg by mouth  3 (three) times daily as needed. For anxiety     . AMLODIPINE BESYLATE 5 MG PO TABS Oral Take 1 tablet (5 mg total) by mouth daily. 90 tablet 3  . ATENOLOL 50 MG PO TABS Oral Take 1 tablet (50 mg total) by mouth daily. 90 tablet 3  . ATORVASTATIN CALCIUM 40 MG PO TABS Oral Take 1 tablet (40 mg total) by mouth daily. 90 tablet 3  . BUPROPION HCL ER (XL) 150 MG PO TB24 Oral Take 1 tablet (150 mg total) by mouth daily. 90 tablet 3  . CARBIDOPA-LEVODOPA ER 25-100 MG PO TBCR Oral Take 1 tablet by mouth at bedtime.    . CELECOXIB 200 MG PO CAPS Oral Take 200 mg by mouth daily.    Marland Kitchen VITAMIN D3 1000 UNITS PO CAPS Oral Take by  mouth daily.      Marland Kitchen CLONAZEPAM 1 MG PO TABS Oral Take 1 tablet (1 mg total) by mouth at bedtime as needed for anxiety. 30 tablet 5  . DESLORATADINE 5 MG PO TABS Oral Take 1 tablet (5 mg total) by mouth daily. 90 tablet 3  . DOCUSATE SODIUM 100 MG PO CAPS Oral Take 100 mg by mouth 2 (two) times daily.      . DONEPEZIL HCL 10 MG PO TABS Oral Take 10 mg by mouth daily. 23 mg once daily     . FLUOXETINE HCL 40 MG PO CAPS Oral Take 1 capsule (40 mg total) by mouth daily. 90 capsule 3  . FLUTICASONE FUROATE 27.5 MCG/SPRAY NA SUSP Nasal Place 2 sprays into the nose daily. 10 g 3  . FLUTICASONE-SALMETEROL 250-50 MCG/DOSE IN AEPB Inhalation Inhale 1 puff into the lungs daily.      . L-METHYLFOLATE-B12-B6-B2 04-08-49-5 MG PO TABS Oral Take 1 tablet by mouth daily. 90 tablet 3  . LEVOCETIRIZINE DIHYDROCHLORIDE 5 MG PO TABS  TAKE 1 TABLET EVERY EVENING 90 tablet 2  . MULTIVITAMINS PO Oral Take by mouth daily.      Marland Kitchen PRIMIDONE 50 MG PO TABS Oral Take 50 mg by mouth 2 (two) times daily.     Marland Kitchen SOLIFENACIN SUCCINATE 5 MG PO TABS Oral Take 5 mg by mouth daily.      . WARFARIN SODIUM 5 MG PO TABS Oral Take 5-7.5 mg by mouth daily. Take 5 mg on Saturday, Tuesday, Wednesday,  Thursday and Sunday then take 7.5 mg on Monday  and Friday    . GUAIFENESIN 400 MG PO TABS Oral Take 400 mg by mouth every 4 (four) hours.      . NYSTATIN 100000 UNIT/GM EX POWD  Use as directed twice per day as needed 15 g 1    BP 163/88  Pulse 59  Temp(Src) 98.9 F (37.2 C) (Oral)  Resp 24  SpO2 91%  Physical Exam  Nursing note and vitals reviewed. Constitutional: He appears well-developed and well-nourished. No distress.  HENT:  Head: Normocephalic.    Right Ear: Tympanic membrane, external ear and ear canal normal.  Left Ear: Tympanic membrane, external ear and ear canal normal.  Nose: Nose normal.  Mouth/Throat: Uvula is midline, oropharynx is clear and moist and mucous membranes are normal. No oropharyngeal exudate,  posterior oropharyngeal edema or posterior oropharyngeal erythema.  Eyes: EOM and lids are normal. Pupils are equal, round, and reactive to light.  Neck: Neck supple.  Cardiovascular: Normal rate, regular rhythm and normal heart sounds.   Pulmonary/Chest: Effort normal and breath sounds normal. No respiratory distress.  Musculoskeletal:  Cervical back: He exhibits no tenderness, no bony tenderness and no spasm.  Lymphadenopathy:    He has no cervical adenopathy.  Neurological: He is alert. No cranial nerve deficit.  Skin: Skin is warm and dry.  Psychiatric: He has a normal mood and affect.    ED Course  Procedures (including critical care time)  Labs Reviewed - No data to display No results found.   1. Head injury   2. Scalp laceration       MDM  Transfer to [facility] for imaging studies - pt takes Coumadin.         Melody Comas, [place] 12/13/11 [phone]

## 2011-12-13 NOTE — ED Notes (Signed)
Asked to waiting room to evaluate patient.  Patient fell today hitting his head on concrete.  Patient is alerted to time and place.  Pt denies LOC, dizziness, nausea, trouble seeing and/or confusion.  Patient is on coumadin but bleeding is well controlled

## 2011-12-13 NOTE — ED Notes (Addendum)
Wife and pt report pt falling against a brick wall when stumbling getting out of car @ approx 1635; hit head against brick wall, then slid down to ground.  No LOC.  Denies any HA, nausea, or any other c/o's except for soreness "all around" neck "like I pulled it".  Bleeding to posterior head is controlled w/ slight oozing.  Pt is on coumadin.

## 2011-12-13 NOTE — ED Notes (Signed)
Bulky gauze head wrap applied to head laceration.

## 2011-12-13 NOTE — ED Notes (Signed)
PT. TRIPPED AND FELL AT A PARKING LOT THIS AFTERNOON , HIT HEAD AGAINST A WALL , DENIES loc , SEEN AT [facility] AND TRANSFERRED HERE FOR FURTHER EVALUATION . LACERATION AT BACK OF HEAD/NECK PAIN .

## 2011-12-13 NOTE — ED Provider Notes (Signed)
History     CSN: [id number]  Arrival date & time 12/13/11  1846   First MD Initiated Contact with Patient 12/13/11 2136      Chief Complaint  Patient presents with  . Fall    (Consider location/radiation/quality/duration/timing/severity/associated sxs/prior treatment) HPI Comments: Patient here after falling while trying to get out of the car and striking the vortex of his head - he was intially seen at urgent care and sent here for further evaluation.  He denies LOC, reports headache and neck pain - he is on coumadin for atrial fib and reports that his INR's have been stable at 2.2 and 2.7 respectively over the past 2 weeks.  He reports mild neck pain but is also being seen by [facility] for neck pain as well.  Denies nausea, vomiting, numbness, tingling, weakness in extremeties, loss of control of bowels or bladder.  Patient is a 77 y.o. male presenting with fall. The history is provided by the patient and the spouse. No language interpreter was used.  Fall The accident occurred 3 to 5 hours ago. Incident: while getting out of the car. He fell from a height of 3 to 5 ft. He landed on concrete. The volume of blood lost was minimal. The point of impact was the head. The pain is present in the head and neck. The pain is at a severity of 6/10. The pain is moderate. He was ambulatory at the scene. There was no entrapment after the fall. There was no drug use involved in the accident. There was no alcohol use involved in the accident. Associated symptoms include headaches. Pertinent negatives include no visual change, no fever, no numbness, no abdominal pain, no bowel incontinence, no nausea, no vomiting, no hematuria, no hearing loss, no loss of consciousness and no tingling. Treatment on scene includes a c-collar. He has tried nothing for the symptoms. The treatment provided no relief.    Past Medical History  Diagnosis Date  . ONYCHOMYCOSIS, TOENAILS 06/27/2008  . HYPERLIPIDEMIA 07/28/2007  . ANXIETY  09/28/2007  . OBSTRUCTIVE SLEEP APNEA 01/01/2008  . RESTLESS LEGS SYNDROME 12/26/2007  . HEARING LOSS 06/27/2008  . HYPERTENSION 07/28/2007  . MYOCARDIAL INFARCTION, HX OF 07/28/2007  . CORONARY ARTERY DISEASE 07/28/2007  . Atrial fibrillation 09/28/2007  . SINUSITIS- ACUTE-NOS 10/10/2007  . ALLERGIC RHINITIS 09/28/2007  . CHOLELITHIASIS 09/28/2007  . BENIGN PROSTATIC HYPERTROPHY 09/28/2007  . CELLULITIS, HAND, LEFT 09/24/2010  . SYNCOPE 07/28/2007  . INSOMNIA-SLEEP DISORDER-UNSPEC 04/03/2009  . HYPERSOMNIA WITH SLEEP APNEA UNSPECIFIED 12/26/2007  . FATIGUE 01/01/2008  . DYSPNEA 04/03/2009  . Wheezing 11/06/2009  . ANGIOEDEMA 11/06/2009  . COLON CANCER, HX OF 07/28/2007  . PROSTATE CANCER, HX OF 07/28/2007  . COLONIC POLYPS, HX OF 07/28/2007  . Long term current use of anticoagulant 12/22/2010  . Asthma   . DEMENTIA   . Spinal stenosis   . History of atrial fibrillation   . Lumbar stenosis 10/30/2011  . CAD (coronary artery disease)     Severe three-vessel coronary disease. LIMA to the LAD patent. SVG to PDA patent. SVG to OM 3 patent. Preserved ejection fraction. 2008.  . Cancer   . Prostate cancer   . Colon cancer   . Thyroid disease   . Arthritis   . Pneumonia     Past Surgical History  Procedure Date  . Prostatectomy 2001  . Cardioversion 2007  . Stress cardiolite 02/08/2007  . Schocardiograph 10/07/2006  . Right thyroid lobectomy   . Coronary artery bypass graft 1995  x 3  . Hernia repair 1998  . Colonoscopy 2010    last done 2010- clear  . Cardiac electrophysiology study and ablation   . Thyroidectomy, partial     Family History  Problem Relation Age of Onset  . Coronary artery disease Father     before 57 yo  . Coronary artery disease Sister     before 71 yo  . Hypertension Other     History  Substance Use Topics  . Smoking status: Never Smoker   . Smokeless tobacco: Not on file  . Alcohol Use: No      Review of Systems  Constitutional: Negative for  fever.  Gastrointestinal: Negative for nausea, vomiting, abdominal pain and bowel incontinence.  Genitourinary: Negative for hematuria.  Neurological: Positive for headaches. Negative for tingling, loss of consciousness and numbness.  All other systems reviewed and are negative.    Allergies  Lisinopril  Home Medications   Current Outpatient Rx  Name Route Sig Dispense Refill  . AMLODIPINE BESYLATE 5 MG PO TABS Oral Take 1 tablet (5 mg total) by mouth daily. 90 tablet 3  . ATENOLOL 50 MG PO TABS Oral Take 50 mg by mouth at bedtime.    . ATORVASTATIN CALCIUM 40 MG PO TABS Oral Take 40 mg by mouth at bedtime.    . BUPROPION HCL ER (XL) 150 MG PO TB24 Oral Take 150 mg by mouth at bedtime.    Marland Kitchen CARBIDOPA-LEVODOPA ER 25-100 MG PO TBCR Oral Take 1 tablet by mouth daily after breakfast.     . CELECOXIB 200 MG PO CAPS Oral Take 200 mg by mouth every morning.     Marland Kitchen VITAMIN D3 1000 UNITS PO CAPS Oral Take 1 capsule by mouth every morning.     Marland Kitchen CLONAZEPAM 1 MG PO TABS Oral Take 1 tablet (1 mg total) by mouth at bedtime as needed for anxiety. 30 tablet 5  . DESLORATADINE 5 MG PO TABS Oral Take 5 mg by mouth every morning.    Marland Kitchen DOCUSATE SODIUM 100 MG PO CAPS Oral Take 200 mg by mouth at bedtime.     . DONEPEZIL HCL 10 MG PO TABS Oral Take 23 mg by mouth every morning. 23 mg once daily     . FLUOXETINE HCL 40 MG PO CAPS Oral Take 40 mg by mouth every morning.    Marland Kitchen FLUTICASONE FUROATE 27.5 MCG/SPRAY NA SUSP Nasal Place 2 sprays into the nose 2 (two) times daily.    Marland Kitchen FLUTICASONE-SALMETEROL 250-50 MCG/DOSE IN AEPB Inhalation Inhale 1 puff into the lungs every morning.     . L-METHYLFOLATE-B12-B6-B2 04-08-49-5 MG PO TABS Oral Take 1 tablet by mouth every morning.    Marland Kitchen LEVOCETIRIZINE DIHYDROCHLORIDE 5 MG PO TABS Oral Take 5 mg by mouth every evening.    . MULTIVITAMINS PO Oral Take 1 tablet by mouth at bedtime.     Marland Kitchen PRIMIDONE 50 MG PO TABS Oral Take 50 mg by mouth 2 (two) times daily.     Marland Kitchen  SOLIFENACIN SUCCINATE 5 MG PO TABS Oral Take 5 mg by mouth every morning.     . WARFARIN SODIUM 5 MG PO TABS Oral Take 5-7.5 mg by mouth every evening. Take 5 mg on Saturday, Tuesday, Wednesday,  Thursday and Sunday then take 7.5 mg on Monday  and Friday    . ALBUTEROL SULFATE HFA 108 (90 BASE) MCG/ACT IN AERS Inhalation Inhale 2 puffs into the lungs every 4 (four) hours as needed. For wheezing  3 Inhaler 3  . ALPRAZOLAM 0.5 MG PO TABS Oral Take 0.5 mg by mouth 3 (three) times daily as needed. For anxiety     . GUAIFENESIN 400 MG PO TABS Oral Take 400 mg by mouth every 4 (four) hours as needed. For chest congestion    . NYSTATIN 100000 UNIT/GM EX POWD Topical Apply 1 Units topically 2 (two) times daily as needed. Apply to groin area for yeast infections.      BP 124/68  Pulse 60  Temp(Src) 97.4 F (36.3 C) (Oral)  Resp 18  SpO2 96%  Physical Exam  Nursing note and vitals reviewed. Constitutional: He is oriented to person, place, and time. He appears well-developed and well-nourished. No distress.  HENT:  Head: Normocephalic. Head is with abrasion and with laceration.    Right Ear: External ear normal.  Left Ear: External ear normal.  Nose: Nose normal.  Mouth/Throat: Oropharynx is clear and moist. No oropharyngeal exudate.       abrased area with 1 cm laceration.  Eyes: Conjunctivae are normal. Pupils are equal, round, and reactive to light. No scleral icterus.  Neck: Neck supple. Spinous process tenderness and muscular tenderness present.  Cardiovascular: Normal rate, regular rhythm and normal heart sounds.  Exam reveals no gallop and no friction rub.   No murmur heard. Pulmonary/Chest: Effort normal and breath sounds normal. He exhibits no tenderness.  Abdominal: Soft. Bowel sounds are normal. He exhibits no distension. There is no tenderness.  Musculoskeletal: Normal range of motion. He exhibits no edema and no tenderness.  Lymphadenopathy:    He has no cervical adenopathy.    Neurological: He is alert and oriented to person, place, and time. He has normal reflexes. No cranial nerve deficit. He exhibits normal muscle tone. Coordination normal.  Skin: Skin is warm and dry. No rash noted. No erythema. No pallor.  Psychiatric: He has a normal mood and affect. His behavior is normal. Judgment and thought content normal.    ED Course  Procedures (including critical care time)  Labs Reviewed - No data to display Ct Head Wo Contrast  12/13/2011  *RADIOLOGY REPORT*  Clinical Data: Dementia, fall, trauma to the back of the head.  CT CERVICAL SPINE WITHOUT CONTRAST,CT HEAD WITHOUT CONTRAST  Technique:  Multidetector CT imaging of the cervical spine was performed. Multiplanar CT image reconstructions were also generated.,Technique:  Contiguous axial images were obtained from the base of the skull through the vertex without contrast.  Comparison: 09/25/2007  Findings: Prominence of the sulci, cisterns, and ventricles, in keeping with volume loss. There are subcortical and periventricular white matter hypodensities, a nonspecific finding most often seen with chronic microangiopathic changes.  There is no evidence for acute hemorrhage, overt hydrocephalus, mass lesion, or abnormal extra-axial fluid collection.  No definite CT evidence for acute cortical based (large artery) infarction. Atherosclerotic vascular calcification.  The visualized paranasal sinuses and mastoid air cells are predominately clear.  No displaced calvarial fracture.  Cervical spine:  Status post right thyroidectomy.  Maintained craniocervical relationship.  Multilevel advanced degenerative changes, most pronounced at C5-6.  There is mild vertebral body height loss at C6.  Multilevel disc osteophyte complexes resulting mild central canal narrowing.  No acute fracture or dislocation identified.  No prevertebral soft tissue swelling.  IMPRESSION: White matter changes.  No definite acute intracranial abnormality.  Advanced  multilevel degenerative changes of the cervical spine.  No acute fracture or dislocation identified.  Original Report Authenticated By: Waneta Martins, M.D.   Ct  Cervical Spine Wo Contrast  12/13/2011  *RADIOLOGY REPORT*  Clinical Data: Dementia, fall, trauma to the back of the head.  CT CERVICAL SPINE WITHOUT CONTRAST,CT HEAD WITHOUT CONTRAST  Technique:  Multidetector CT imaging of the cervical spine was performed. Multiplanar CT image reconstructions were also generated.,Technique:  Contiguous axial images were obtained from the base of the skull through the vertex without contrast.  Comparison: 09/25/2007  Findings: Prominence of the sulci, cisterns, and ventricles, in keeping with volume loss. There are subcortical and periventricular white matter hypodensities, a nonspecific finding most often seen with chronic microangiopathic changes.  There is no evidence for acute hemorrhage, overt hydrocephalus, mass lesion, or abnormal extra-axial fluid collection.  No definite CT evidence for acute cortical based (large artery) infarction. Atherosclerotic vascular calcification.  The visualized paranasal sinuses and mastoid air cells are predominately clear.  No displaced calvarial fracture.  Cervical spine:  Status post right thyroidectomy.  Maintained craniocervical relationship.  Multilevel advanced degenerative changes, most pronounced at C5-6.  There is mild vertebral body height loss at C6.  Multilevel disc osteophyte complexes resulting mild central canal narrowing.  No acute fracture or dislocation identified.  No prevertebral soft tissue swelling.  IMPRESSION: White matter changes.  No definite acute intracranial abnormality.  Advanced multilevel degenerative changes of the cervical spine.  No acute fracture or dislocation identified.  Original Report Authenticated By: Waneta Martins, M.D.   LACERATION REPAIR Performed by: Patrecia Pour Authorized by: Patrecia Pour Consent: Verbal  consent obtained. Risks and benefits: risks, benefits and alternatives were discussed Consent given by: patient Patient identity confirmed: provided demographic data Prepped and Draped in normal sterile fashion Wound explored  Laceration Location: vortex of scalp  Laceration Length: 1cm  No Foreign Bodies seen or palpated  Anesthesia: local infiltration  Local anesthetic: lidocaine 2% without epinephrine  Anesthetic total: 3 ml  Irrigation method: syringe Amount of cleaning: standard  Skin closure: staples  Number of sutures: 4  Technique: simple interrupted  Patient tolerance: Patient tolerated the procedure well with no immediate complications.  1. Head injury   2. Scalp wound       MDM  Patient at normal mental status with head injury.  I have seen the patient with Dr. Rubin Payor, CT without bleeding at this time, patient and his wife instructed to return should he continue with severe headache, seizures, nausea, vomiting or any further concerns.  There are no concerns for cauda equina or cord compression at this time and he is at his baseline.        Izola Price [employer], [place] 12/13/11 2328

## 2011-12-14 ENCOUNTER — Encounter: Payer: Self-pay | Admitting: *Deleted

## 2011-12-14 NOTE — ED Provider Notes (Signed)
Medical screening examination/treatment/procedure(s) were performed by non-physician practitioner and as supervising physician I was immediately available for consultation/collaboration.  Juliet Rude. Rubin Payor, MD 12/14/11 [phone]

## 2011-12-14 NOTE — Progress Notes (Signed)
This encounter was created in error - please disregard.

## 2011-12-14 NOTE — ED Provider Notes (Signed)
Medical screening examination/treatment/procedure(s) were performed by non-physician practitioner and as supervising physician I was immediately available for consultation/collaboration.   Barkley Bruns MD.    Barkley Bruns, MD 12/14/11 [phone]

## 2011-12-23 ENCOUNTER — Encounter: Payer: Medicare Other | Admitting: *Deleted

## 2011-12-23 ENCOUNTER — Ambulatory Visit: Payer: Medicare Other | Admitting: Internal Medicine

## 2011-12-23 ENCOUNTER — Encounter: Payer: Self-pay | Admitting: Internal Medicine

## 2011-12-23 ENCOUNTER — Ambulatory Visit (INDEPENDENT_AMBULATORY_CARE_PROVIDER_SITE_OTHER): Payer: Medicare Other | Admitting: Internal Medicine

## 2011-12-23 VITALS — BP 120/70 | HR 52 | Temp 98.4°F | Ht 69.0 in | Wt 222.1 lb

## 2011-12-23 DIAGNOSIS — S0100XA Unspecified open wound of scalp, initial encounter: Secondary | ICD-10-CM | POA: Diagnosis not present

## 2011-12-23 DIAGNOSIS — I10 Essential (primary) hypertension: Secondary | ICD-10-CM

## 2011-12-23 DIAGNOSIS — F068 Other specified mental disorders due to known physiological condition: Secondary | ICD-10-CM | POA: Diagnosis not present

## 2011-12-23 DIAGNOSIS — M5030 Other cervical disc degeneration, unspecified cervical region: Secondary | ICD-10-CM | POA: Diagnosis not present

## 2011-12-23 DIAGNOSIS — S0101XA Laceration without foreign body of scalp, initial encounter: Secondary | ICD-10-CM

## 2011-12-23 NOTE — Patient Instructions (Signed)
Continue all other medications as before Your staples were removed today

## 2011-12-24 ENCOUNTER — Ambulatory Visit (INDEPENDENT_AMBULATORY_CARE_PROVIDER_SITE_OTHER): Payer: Medicare Other | Admitting: *Deleted

## 2011-12-24 DIAGNOSIS — M48061 Spinal stenosis, lumbar region without neurogenic claudication: Secondary | ICD-10-CM | POA: Diagnosis not present

## 2011-12-24 DIAGNOSIS — Z7901 Long term (current) use of anticoagulants: Secondary | ICD-10-CM

## 2011-12-24 DIAGNOSIS — I4891 Unspecified atrial fibrillation: Secondary | ICD-10-CM | POA: Diagnosis not present

## 2011-12-24 LAB — POCT INR: INR: 2.9

## 2011-12-25 ENCOUNTER — Encounter: Payer: Self-pay | Admitting: Internal Medicine

## 2011-12-25 DIAGNOSIS — S0101XA Laceration without foreign body of scalp, initial encounter: Secondary | ICD-10-CM | POA: Insufficient documentation

## 2011-12-25 NOTE — Assessment & Plan Note (Signed)
stable overall by hx and exam, most recent data reviewed with pt, and pt to continue medical treatment as before  BP Readings from Last 3 Encounters:  12/23/11 120/70  12/13/11 124/68  12/13/11 163/88

## 2011-12-25 NOTE — Assessment & Plan Note (Signed)
stable overall by hx and exam, 4 staple removed,, and pt to continue medical treatment as before

## 2011-12-25 NOTE — Progress Notes (Signed)
Subjective:    Patient ID: [name] [name], male    DOB: [birth year], 77 y.o.   MRN: [id number]  HPI  Here to f/u after trip and fall into a brick wall with laceration to the crown of the head approx 10 days ago, now for 4 staples to be removed.  Pt denies fever, wt loss, night sweats, loss of appetite, or other constitutional symptoms  No s/s red, swelling, tender current but did have quite a bit of bleeding on the anticoag.  Seeing Dr Yevette Edwards for back and neck pain, recent predpack helping. Pt denies chest pain, increased sob or doe, wheezing, orthopnea, PND, increased LE swelling, palpitations, dizziness or syncope.  Pt denies new neurological symptoms such as new headache, or facial or extremity weakness or numbness   Pt denies polydipsia, polyuria. Dementia overall stable symptomatically with gradual worsening at best, and not assoc with behavioral changes such as hallucinations, paranoia, or agitation. Past Medical History  Diagnosis Date  . ONYCHOMYCOSIS, TOENAILS 06/27/2008  . HYPERLIPIDEMIA 07/28/2007  . ANXIETY 09/28/2007  . OBSTRUCTIVE SLEEP APNEA 01/01/2008  . RESTLESS LEGS SYNDROME 12/26/2007  . HEARING LOSS 06/27/2008  . HYPERTENSION 07/28/2007  . MYOCARDIAL INFARCTION, HX OF 07/28/2007  . CORONARY ARTERY DISEASE 07/28/2007  . Atrial fibrillation 09/28/2007  . SINUSITIS- ACUTE-NOS 10/10/2007  . ALLERGIC RHINITIS 09/28/2007  . CHOLELITHIASIS 09/28/2007  . BENIGN PROSTATIC HYPERTROPHY 09/28/2007  . CELLULITIS, HAND, LEFT 09/24/2010  . SYNCOPE 07/28/2007  . INSOMNIA-SLEEP DISORDER-UNSPEC 04/03/2009  . HYPERSOMNIA WITH SLEEP APNEA UNSPECIFIED 12/26/2007  . FATIGUE 01/01/2008  . DYSPNEA 04/03/2009  . Wheezing 11/06/2009  . ANGIOEDEMA 11/06/2009  . COLON CANCER, HX OF 07/28/2007  . PROSTATE CANCER, HX OF 07/28/2007  . COLONIC POLYPS, HX OF 07/28/2007  . Long term current use of anticoagulant 12/22/2010  . Asthma   . DEMENTIA   . Spinal stenosis   . History of atrial fibrillation   . Lumbar  stenosis 10/30/2011  . CAD (coronary artery disease)     Severe three-vessel coronary disease. LIMA to the LAD patent. SVG to PDA patent. SVG to OM 3 patent. Preserved ejection fraction. 2008.  . Cancer   . Prostate cancer   . Colon cancer   . Thyroid disease   . Arthritis   . Pneumonia    Past Surgical History  Procedure Date  . Prostatectomy 2001  . Cardioversion 2007  . Stress cardiolite 02/08/2007  . Schocardiograph 10/07/2006  . Right thyroid lobectomy   . Coronary artery bypass graft 1995    x 3  . Hernia repair 1998  . Colonoscopy 2010    last done 2010- clear  . Cardiac electrophysiology study and ablation   . Thyroidectomy, partial     reports that he has never smoked. He does not have any smokeless tobacco history on file. He reports that he does not drink alcohol or use illicit drugs. family history includes Coronary artery disease in his father and sister and Hypertension in his other. Allergies  Allergen Reactions  . Lisinopril Swelling   Current Outpatient Prescriptions on File Prior to Visit  Medication Sig Dispense Refill  . albuterol (PROAIR HFA) 108 (90 BASE) MCG/ACT inhaler Inhale 2 puffs into the lungs every 4 (four) hours as needed. For wheezing  3 Inhaler  3  . ALPRAZolam (XANAX) 0.5 MG tablet Take 0.5 mg by mouth 3 (three) times daily as needed. For anxiety       . amLODipine (NORVASC) 5 MG tablet Take 1 tablet (5  mg total) by mouth daily.  90 tablet  3  . atenolol (TENORMIN) 50 MG tablet Take 50 mg by mouth at bedtime.      Marland Kitchen atorvastatin (LIPITOR) 40 MG tablet Take 40 mg by mouth at bedtime.      Marland Kitchen buPROPion (WELLBUTRIN XL) 150 MG 24 hr tablet Take 150 mg by mouth at bedtime.      . carbidopa-levodopa (SINEMET CR) 25-100 MG per tablet Take 1 tablet by mouth daily after breakfast.       . celecoxib (CELEBREX) 200 MG capsule Take 200 mg by mouth every morning.       . Cholecalciferol (VITAMIN D3) 1000 UNITS CAPS Take 1 capsule by mouth every morning.        . clonazePAM (KLONOPIN) 1 MG tablet Take 1 tablet (1 mg total) by mouth at bedtime as needed for anxiety.  30 tablet  5  . desloratadine (CLARINEX) 5 MG tablet Take 5 mg by mouth every morning.      . docusate sodium (COLACE) 100 MG capsule Take 200 mg by mouth at bedtime.       . donepezil (ARICEPT) 10 MG tablet Take 23 mg by mouth every morning. 23 mg once daily       . FLUoxetine (PROZAC) 40 MG capsule Take 40 mg by mouth every morning.      . fluticasone (VERAMYST) 27.5 MCG/SPRAY nasal spray Place 2 sprays into the nose 2 (two) times daily.      . Fluticasone-Salmeterol (ADVAIR) 250-50 MCG/DOSE AEPB Inhale 1 puff into the lungs every morning.       Marland Kitchen guaifenesin (HUMIBID E) 400 MG TABS Take 400 mg by mouth every 4 (four) hours as needed. For chest congestion      . l-methylfolate-b2-b6-b12 (CEREFOLIN) 04-08-49-5 MG TABS Take 1 tablet by mouth every morning.      Marland Kitchen levocetirizine (XYZAL) 5 MG tablet Take 5 mg by mouth every evening.      . Multiple Vitamin (MULTIVITAMINS PO) Take 1 tablet by mouth at bedtime.       Marland Kitchen nystatin (MYCOSTATIN) powder Apply 1 Units topically 2 (two) times daily as needed. Apply to groin area for yeast infections.      . primidone (MYSOLINE) 50 MG tablet Take 50 mg by mouth 2 (two) times daily.       . solifenacin (VESICARE) 5 MG tablet Take 5 mg by mouth every morning.       . warfarin (COUMADIN) 5 MG tablet Take 5-7.5 mg by mouth every evening. Take 5 mg on Saturday, Tuesday, Wednesday,  Thursday and Sunday then take 7.5 mg on Monday  and Friday        Review of Systems Review of Systems  Constitutional: Negative for diaphoresis and unexpected weight change.  HENT: Negative for drooling and tinnitus.   Eyes: Negative for photophobia and visual disturbance.  Respiratory: Negative for choking and stridor.   Gastrointestinal: Negative for vomiting and blood in stool.  Genitourinary: Negative for hematuria and decreased urine volume.  Musculoskeletal: Negative  for gait problem.      Objective:   Physical Exam BP 120/70  Pulse 52  Temp(Src) 98.4 F (36.9 C) (Oral)  Ht 5\' 9"  (1.753 m)  Wt 222 lb 2 oz (100.755 kg)  BMI 32.80 kg/m2  SpO2 94% Physical Exam  VS noted Constitutional: Pt appears well-developed and well-nourished.  HENT: Head: Normocephalic.  Right Ear: External ear normal.  Left Ear: External ear normal.  Eyes: Conjunctivae  and EOM are normal. Pupils are equal, round, and reactive to light.  Neck: Normal range of motion. Neck supple.  Cardiovascular: Normal rate and regular rhythm.   Pulmonary/Chest: Effort normal and breath sounds normal.  Abd:  Soft, NT, non-distended, + BS Neurological: Pt is alert. No cranial nerve deficit. no new gait problem Skin: Skin is warm. No erythema. crown of head laceration intact without cellulitis, bleeding or drainage Psychiatric: Pt behavior is normal. Thought content c/w dementia, no change   Assessment & Plan:

## 2011-12-25 NOTE — Assessment & Plan Note (Signed)
stable overall by hx and exam, most recent data reviewed with pt, and pt to continue medical treatment as before  Lab Results  Component Value Date   WBC 7.8 09/25/2011   HGB 13.5 09/25/2011   HCT 40.5 09/25/2011   PLT 169 09/25/2011   GLUCOSE 103* 09/23/2011   CHOL 133 03/04/2010   TRIG 267.0* 03/04/2010   HDL 34.10* 03/04/2010   LDLDIRECT 54.5 03/04/2010   LDLCALC 58 01/01/2008   ALT 21 09/22/2011   AST 54* 09/22/2011   NA 140 09/23/2011   K 3.8 09/23/2011   CL 100 09/23/2011   CREATININE 0.89 09/23/2011   BUN 18 09/23/2011   CO2 32 09/23/2011   TSH 1.83 07/14/2010   PSA 0.03* 03/04/2010   INR 2.9 12/24/2011   HGBA1C 5.3 03/04/2010

## 2012-01-12 ENCOUNTER — Ambulatory Visit: Payer: Medicare Other | Admitting: Internal Medicine

## 2012-01-24 ENCOUNTER — Ambulatory Visit (INDEPENDENT_AMBULATORY_CARE_PROVIDER_SITE_OTHER): Payer: Medicare Other | Admitting: Pharmacist

## 2012-01-24 DIAGNOSIS — Z7901 Long term (current) use of anticoagulants: Secondary | ICD-10-CM

## 2012-01-24 DIAGNOSIS — I4891 Unspecified atrial fibrillation: Secondary | ICD-10-CM

## 2012-01-24 LAB — POCT INR: INR: 3.2

## 2012-01-26 DIAGNOSIS — M25569 Pain in unspecified knee: Secondary | ICD-10-CM | POA: Diagnosis not present

## 2012-01-26 DIAGNOSIS — M25559 Pain in unspecified hip: Secondary | ICD-10-CM | POA: Diagnosis not present

## 2012-01-26 DIAGNOSIS — R5383 Other fatigue: Secondary | ICD-10-CM | POA: Diagnosis not present

## 2012-01-26 DIAGNOSIS — R300 Dysuria: Secondary | ICD-10-CM | POA: Diagnosis not present

## 2012-01-26 DIAGNOSIS — M2550 Pain in unspecified joint: Secondary | ICD-10-CM | POA: Diagnosis not present

## 2012-01-26 DIAGNOSIS — M545 Low back pain: Secondary | ICD-10-CM | POA: Diagnosis not present

## 2012-01-26 DIAGNOSIS — IMO0001 Reserved for inherently not codable concepts without codable children: Secondary | ICD-10-CM | POA: Diagnosis not present

## 2012-01-26 DIAGNOSIS — Z79899 Other long term (current) drug therapy: Secondary | ICD-10-CM | POA: Diagnosis not present

## 2012-02-08 DIAGNOSIS — M5137 Other intervertebral disc degeneration, lumbosacral region: Secondary | ICD-10-CM | POA: Diagnosis not present

## 2012-02-08 DIAGNOSIS — M999 Biomechanical lesion, unspecified: Secondary | ICD-10-CM | POA: Diagnosis not present

## 2012-02-18 ENCOUNTER — Other Ambulatory Visit: Payer: Self-pay

## 2012-02-18 ENCOUNTER — Other Ambulatory Visit: Payer: Self-pay | Admitting: Internal Medicine

## 2012-02-18 MED ORDER — CLONAZEPAM 1 MG PO TABS: 1.0000 mg | ORAL_TABLET | Freq: Every evening | ORAL | Status: DC | PRN

## 2012-02-18 NOTE — Telephone Encounter (Signed)
Faxed hardcopy to pharmacy. 

## 2012-02-18 NOTE — Telephone Encounter (Signed)
Done hardcopy to robin  

## 2012-02-21 ENCOUNTER — Ambulatory Visit (INDEPENDENT_AMBULATORY_CARE_PROVIDER_SITE_OTHER): Payer: Medicare Other | Admitting: Pharmacist

## 2012-02-21 DIAGNOSIS — Z7901 Long term (current) use of anticoagulants: Secondary | ICD-10-CM

## 2012-02-21 DIAGNOSIS — I4891 Unspecified atrial fibrillation: Secondary | ICD-10-CM

## 2012-02-22 ENCOUNTER — Ambulatory Visit: Payer: Self-pay | Admitting: Internal Medicine

## 2012-02-22 DIAGNOSIS — G4710 Hypersomnia, unspecified: Secondary | ICD-10-CM | POA: Diagnosis not present

## 2012-02-22 DIAGNOSIS — G253 Myoclonus: Secondary | ICD-10-CM | POA: Diagnosis not present

## 2012-02-22 DIAGNOSIS — G4752 REM sleep behavior disorder: Secondary | ICD-10-CM | POA: Diagnosis not present

## 2012-02-22 DIAGNOSIS — M353 Polymyalgia rheumatica: Secondary | ICD-10-CM | POA: Diagnosis not present

## 2012-02-29 ENCOUNTER — Encounter: Payer: Self-pay | Admitting: Internal Medicine

## 2012-02-29 ENCOUNTER — Ambulatory Visit (INDEPENDENT_AMBULATORY_CARE_PROVIDER_SITE_OTHER): Payer: Medicare Other | Admitting: Internal Medicine

## 2012-02-29 ENCOUNTER — Telehealth: Payer: Self-pay

## 2012-02-29 VITALS — BP 112/54 | HR 59 | Temp 98.8°F | Ht 69.0 in | Wt 227.2 lb

## 2012-02-29 DIAGNOSIS — I10 Essential (primary) hypertension: Secondary | ICD-10-CM | POA: Diagnosis not present

## 2012-02-29 DIAGNOSIS — J45909 Unspecified asthma, uncomplicated: Secondary | ICD-10-CM | POA: Diagnosis not present

## 2012-02-29 DIAGNOSIS — F329 Major depressive disorder, single episode, unspecified: Secondary | ICD-10-CM | POA: Diagnosis not present

## 2012-02-29 DIAGNOSIS — E785 Hyperlipidemia, unspecified: Secondary | ICD-10-CM

## 2012-02-29 MED ORDER — FLUOXETINE HCL 40 MG PO CAPS: 40.0000 mg | ORAL_CAPSULE | ORAL | Status: DC

## 2012-02-29 MED ORDER — DESLORATADINE 5 MG PO TABS: 5.0000 mg | ORAL_TABLET | ORAL | Status: DC

## 2012-02-29 MED ORDER — ATORVASTATIN CALCIUM 40 MG PO TABS: 40.0000 mg | ORAL_TABLET | Freq: Every day | ORAL | Status: DC

## 2012-02-29 MED ORDER — CELECOXIB 200 MG PO CAPS: 200.0000 mg | ORAL_CAPSULE | ORAL | Status: DC

## 2012-02-29 MED ORDER — LEVOCETIRIZINE DIHYDROCHLORIDE 5 MG PO TABS: 5.0000 mg | ORAL_TABLET | Freq: Every evening | ORAL | Status: DC

## 2012-02-29 MED ORDER — BUPROPION HCL ER (XL) 150 MG PO TB24: 150.0000 mg | ORAL_TABLET | Freq: Every day | ORAL | Status: DC

## 2012-02-29 MED ORDER — WARFARIN SODIUM 5 MG PO TABS: 5.0000 mg | ORAL_TABLET | Freq: Every evening | ORAL | Status: DC

## 2012-02-29 MED ORDER — FLUTICASONE-SALMETEROL 250-50 MCG/DOSE IN AEPB: 1.0000 | INHALATION_SPRAY | RESPIRATORY_TRACT | Status: DC

## 2012-02-29 MED ORDER — AMLODIPINE BESYLATE 5 MG PO TABS: 5.0000 mg | ORAL_TABLET | Freq: Every day | ORAL | Status: DC

## 2012-02-29 MED ORDER — ATENOLOL 50 MG PO TABS: 50.0000 mg | ORAL_TABLET | Freq: Every day | ORAL | Status: DC

## 2012-02-29 MED ORDER — SOLIFENACIN SUCCINATE 5 MG PO TABS: 5.0000 mg | ORAL_TABLET | ORAL | Status: DC

## 2012-02-29 MED ORDER — ALBUTEROL SULFATE HFA 108 (90 BASE) MCG/ACT IN AERS: 2.0000 | INHALATION_SPRAY | RESPIRATORY_TRACT | Status: DC | PRN

## 2012-02-29 MED ORDER — FLUTICASONE FUROATE 27.5 MCG/SPRAY NA SUSP: 1.0000 | Freq: Every day | NASAL | Status: DC

## 2012-02-29 NOTE — Assessment & Plan Note (Signed)
stable overall by hx and exam, most recent data reviewed with pt, and pt to continue medical treatment as before  SpO2 Readings from Last 3 Encounters:  02/29/12 94%  12/23/11 94%  12/13/11 96%

## 2012-02-29 NOTE — Patient Instructions (Signed)
Continue all other medications as before Your refills were done today Please return in 6 months, or sooner if needed

## 2012-02-29 NOTE — Assessment & Plan Note (Addendum)
stable overall by hx and exam, most recent data reviewed with pt, and pt to continue medical treatment as before  Lab Results  Component Value Date   LDLCALC 58 01/01/2008   For lipid repeat on the lipitor, goal ldl < 70

## 2012-02-29 NOTE — Assessment & Plan Note (Signed)
stable overall by hx and exam, most recent data reviewed with pt, and pt to continue medical treatment as before  BP Readings from Last 3 Encounters:  02/29/12 112/54  12/23/11 120/70  12/13/11 124/68  '

## 2012-02-29 NOTE — Telephone Encounter (Signed)
Prescriptions sent to pharmacy

## 2012-02-29 NOTE — Progress Notes (Signed)
Subjective:    Patient ID: [name] [name], male    DOB: [birth year], 77 y.o.   MRN: [id number]  HPI  Here to f/u with wife who gives hx; overall doing well, had 27/30 on minimental status on current meds, including sinamet now taking more for stiffness and tremors (not for parkinsons) - seeing Dr Priscella Mann;  Also saw Dr Luiz Blare for LBP, had ESI to lumbar which did not seem to help, subsequent MRI without need for surgury; had cortisone for left shoulder DJD and pain improved;  Pt denies chest pain, increased sob or doe, wheezing, orthopnea, PND, increased LE swelling, palpitations, dizziness or syncope.   Pt denies polydipsia, polyuria.   Pt denies fever, wt loss, night sweats, loss of appetite, or other constitutional symptoms.  Had PNA episode nov 2012.  On chronic prednisone for PMR per Dr Corliss Skains. Denies worsening depressive symptoms, suicidal ideation, or panic, though has ongoing anxiety, not increased recently.     Past Medical History  Diagnosis Date  . ONYCHOMYCOSIS, TOENAILS 06/27/2008  . HYPERLIPIDEMIA 07/28/2007  . ANXIETY 09/28/2007  . OBSTRUCTIVE SLEEP APNEA 01/01/2008  . RESTLESS LEGS SYNDROME 12/26/2007  . HEARING LOSS 06/27/2008  . HYPERTENSION 07/28/2007  . MYOCARDIAL INFARCTION, HX OF 07/28/2007  . CORONARY ARTERY DISEASE 07/28/2007  . Atrial fibrillation 09/28/2007  . SINUSITIS- ACUTE-NOS 10/10/2007  . ALLERGIC RHINITIS 09/28/2007  . CHOLELITHIASIS 09/28/2007  . BENIGN PROSTATIC HYPERTROPHY 09/28/2007  . CELLULITIS, HAND, LEFT 09/24/2010  . SYNCOPE 07/28/2007  . INSOMNIA-SLEEP DISORDER-UNSPEC 04/03/2009  . HYPERSOMNIA WITH SLEEP APNEA UNSPECIFIED 12/26/2007  . FATIGUE 01/01/2008  . DYSPNEA 04/03/2009  . Wheezing 11/06/2009  . ANGIOEDEMA 11/06/2009  . COLON CANCER, HX OF 07/28/2007  . PROSTATE CANCER, HX OF 07/28/2007  . COLONIC POLYPS, HX OF 07/28/2007  . Long term current use of anticoagulant 12/22/2010  . Asthma   . DEMENTIA   . Spinal stenosis   . History of atrial  fibrillation   . Lumbar stenosis 10/30/2011  . CAD (coronary artery disease)     Severe three-vessel coronary disease. LIMA to the LAD patent. SVG to PDA patent. SVG to OM 3 patent. Preserved ejection fraction. 2008.  . Cancer   . Prostate cancer   . Colon cancer   . Thyroid disease   . Arthritis   . Pneumonia    Past Surgical History  Procedure Date  . Prostatectomy 2001  . Cardioversion 2007  . Stress cardiolite 02/08/2007  . Schocardiograph 10/07/2006  . Right thyroid lobectomy   . Coronary artery bypass graft 1995    x 3  . Hernia repair 1998  . Colonoscopy 2010    last done 2010- clear  . Cardiac electrophysiology study and ablation   . Thyroidectomy, partial     reports that he has never smoked. He does not have any smokeless tobacco history on file. He reports that he does not drink alcohol or use illicit drugs. family history includes Coronary artery disease in his father and sister and Hypertension in his other. Allergies  Allergen Reactions  . Lisinopril Swelling   Current Outpatient Prescriptions on File Prior to Visit  Medication Sig Dispense Refill  . albuterol (PROAIR HFA) 108 (90 BASE) MCG/ACT inhaler Inhale 2 puffs into the lungs every 4 (four) hours as needed. For wheezing  3 Inhaler  3  . ALPRAZolam (XANAX) 0.5 MG tablet Take 0.5 mg by mouth 3 (three) times daily as needed. For anxiety       . amLODipine (NORVASC) 5 MG  tablet Take 1 tablet (5 mg total) by mouth daily.  90 tablet  3  . atenolol (TENORMIN) 50 MG tablet Take 50 mg by mouth at bedtime.      Marland Kitchen atorvastatin (LIPITOR) 40 MG tablet Take 40 mg by mouth at bedtime.      Marland Kitchen buPROPion (WELLBUTRIN XL) 150 MG 24 hr tablet Take 150 mg by mouth at bedtime.      . carbidopa-levodopa (SINEMET CR) 25-100 MG per tablet Take 1 tablet by mouth daily after breakfast.       . Cholecalciferol (VITAMIN D3) 1000 UNITS CAPS Take 1 capsule by mouth every morning.       . clonazePAM (KLONOPIN) 1 MG tablet Take 1 tablet (1  mg total) by mouth at bedtime as needed for anxiety.  30 tablet  5  . desloratadine (CLARINEX) 5 MG tablet Take 5 mg by mouth every morning.      . docusate sodium (COLACE) 100 MG capsule Take 200 mg by mouth at bedtime.       Marland Kitchen FLUoxetine (PROZAC) 40 MG capsule Take 40 mg by mouth every morning.      . Fluticasone-Salmeterol (ADVAIR) 250-50 MCG/DOSE AEPB Inhale 1 puff into the lungs every morning.       Marland Kitchen guaifenesin (HUMIBID E) 400 MG TABS Take 400 mg by mouth every 4 (four) hours as needed. For chest congestion      . l-methylfolate-b2-b6-b12 (CEREFOLIN) 04-08-49-5 MG TABS Take 1 tablet by mouth every morning.      Marland Kitchen levocetirizine (XYZAL) 5 MG tablet Take 5 mg by mouth every evening.      . Multiple Vitamin (MULTIVITAMINS PO) Take 1 tablet by mouth at bedtime.       Marland Kitchen nystatin (MYCOSTATIN) powder Apply 1 Units topically 2 (two) times daily as needed. Apply to groin area for yeast infections.      . primidone (MYSOLINE) 50 MG tablet Take 50 mg by mouth 2 (two) times daily.       . solifenacin (VESICARE) 5 MG tablet Take 5 mg by mouth every morning.       . VERAMYST 27.5 MCG/SPRAY nasal spray USE 2 SPRAYS NASALLY DAILY  30 g  3  . warfarin (COUMADIN) 5 MG tablet Take 5-7.5 mg by mouth every evening. Take 5 mg on Saturday, Tuesday, Wednesday,  Thursday and Sunday then take 7.5 mg on Monday  and Friday      . celecoxib (CELEBREX) 200 MG capsule Take 200 mg by mouth every morning.       . clonazePAM (KLONOPIN) 1 MG tablet Take 1 tablet (1 mg total) by mouth at bedtime.  90 tablet  1  . donepezil (ARICEPT) 10 MG tablet Take 23 mg by mouth every morning. 23 mg once daily        Review of Systems Review of Systems  Constitutional: Negative for diaphoresis and unexpected weight change.  HENT: Negative for drooling and tinnitus.   Eyes: Negative for photophobia and visual disturbance.  Respiratory: Negative for choking and stridor.   Gastrointestinal: Negative for vomiting and blood in stool.    Genitourinary: Negative for hematuria and decreased urine volume.  Musculoskeletal: Negative for gait problem.  Skin: Negative for color change and wound.  Neurological: Negative for tremors and numbness.     Objective:   Physical Exam BP 112/54  Pulse 59  Temp(Src) 98.8 F (37.1 C) (Oral)  Ht 5\' 9"  (1.753 m)  Wt 227 lb 4 oz (103.08 kg)  BMI 33.56 kg/m2  SpO2 94% Physical Exam  VS noted Constitutional: Pt appears well-developed and well-nourished.  HENT: Head: Normocephalic.  Right Ear: External ear normal.  Left Ear: External ear normal.  Eyes: Conjunctivae and EOM are normal. Pupils are equal, round, and reactive to light.  Neck: Normal range of motion. Neck supple.  Cardiovascular: Normal rate and regular rhythm.   Pulmonary/Chest: Effort normal and breath sounds normal.  Abd:  Soft, NT, non-distended, + BS Neurological: Pt is alert. No cranial nerve deficit.  Skin: Skin is warm. No erythema. trace LE edema bilat, no ulcers Psychiatric: Pt behavior is normal. Thought contet c/w severe dementia, not depressed appaering or nervous    Assessment & Plan:

## 2012-02-29 NOTE — Assessment & Plan Note (Signed)
stable overall by hx and exam, most recent data reviewed with pt, and pt to continue medical treatment as before  Lab Results  Component Value Date   WBC 7.8 09/25/2011   HGB 13.5 09/25/2011   HCT 40.5 09/25/2011   PLT 169 09/25/2011   GLUCOSE 103* 09/23/2011   CHOL 133 03/04/2010   TRIG 267.0* 03/04/2010   HDL 34.10* 03/04/2010   LDLDIRECT 54.5 03/04/2010   LDLCALC 58 01/01/2008   ALT 21 09/22/2011   AST 54* 09/22/2011   NA 140 09/23/2011   K 3.8 09/23/2011   CL 100 09/23/2011   CREATININE 0.89 09/23/2011   BUN 18 09/23/2011   CO2 32 09/23/2011   TSH 1.83 07/14/2010   PSA 0.03* 03/04/2010   INR 2.8 02/21/2012   HGBA1C 5.3 03/04/2010

## 2012-03-01 DIAGNOSIS — M25559 Pain in unspecified hip: Secondary | ICD-10-CM | POA: Diagnosis not present

## 2012-03-01 DIAGNOSIS — M25569 Pain in unspecified knee: Secondary | ICD-10-CM | POA: Diagnosis not present

## 2012-03-01 DIAGNOSIS — IMO0001 Reserved for inherently not codable concepts without codable children: Secondary | ICD-10-CM | POA: Diagnosis not present

## 2012-03-01 DIAGNOSIS — M999 Biomechanical lesion, unspecified: Secondary | ICD-10-CM | POA: Diagnosis not present

## 2012-03-01 DIAGNOSIS — M545 Low back pain: Secondary | ICD-10-CM | POA: Diagnosis not present

## 2012-03-01 DIAGNOSIS — M5137 Other intervertebral disc degeneration, lumbosacral region: Secondary | ICD-10-CM | POA: Diagnosis not present

## 2012-03-22 ENCOUNTER — Ambulatory Visit (INDEPENDENT_AMBULATORY_CARE_PROVIDER_SITE_OTHER): Payer: Medicare Other | Admitting: *Deleted

## 2012-03-22 DIAGNOSIS — Z7901 Long term (current) use of anticoagulants: Secondary | ICD-10-CM

## 2012-03-22 DIAGNOSIS — I4891 Unspecified atrial fibrillation: Secondary | ICD-10-CM | POA: Diagnosis not present

## 2012-03-22 LAB — POCT INR: INR: 3.9

## 2012-04-04 DIAGNOSIS — M999 Biomechanical lesion, unspecified: Secondary | ICD-10-CM | POA: Diagnosis not present

## 2012-04-04 DIAGNOSIS — M5137 Other intervertebral disc degeneration, lumbosacral region: Secondary | ICD-10-CM | POA: Diagnosis not present

## 2012-04-06 DIAGNOSIS — M999 Biomechanical lesion, unspecified: Secondary | ICD-10-CM | POA: Diagnosis not present

## 2012-04-06 DIAGNOSIS — M5137 Other intervertebral disc degeneration, lumbosacral region: Secondary | ICD-10-CM | POA: Diagnosis not present

## 2012-04-07 ENCOUNTER — Other Ambulatory Visit: Payer: Self-pay | Admitting: Internal Medicine

## 2012-04-10 DIAGNOSIS — M5137 Other intervertebral disc degeneration, lumbosacral region: Secondary | ICD-10-CM | POA: Diagnosis not present

## 2012-04-10 DIAGNOSIS — M999 Biomechanical lesion, unspecified: Secondary | ICD-10-CM | POA: Diagnosis not present

## 2012-04-17 ENCOUNTER — Ambulatory Visit (INDEPENDENT_AMBULATORY_CARE_PROVIDER_SITE_OTHER): Payer: Medicare Other

## 2012-04-17 DIAGNOSIS — Z7901 Long term (current) use of anticoagulants: Secondary | ICD-10-CM | POA: Diagnosis not present

## 2012-04-17 DIAGNOSIS — M2550 Pain in unspecified joint: Secondary | ICD-10-CM | POA: Diagnosis not present

## 2012-04-17 DIAGNOSIS — R5383 Other fatigue: Secondary | ICD-10-CM | POA: Diagnosis not present

## 2012-04-17 DIAGNOSIS — I4891 Unspecified atrial fibrillation: Secondary | ICD-10-CM

## 2012-04-17 DIAGNOSIS — R5381 Other malaise: Secondary | ICD-10-CM | POA: Diagnosis not present

## 2012-04-17 DIAGNOSIS — M5137 Other intervertebral disc degeneration, lumbosacral region: Secondary | ICD-10-CM | POA: Diagnosis not present

## 2012-04-17 DIAGNOSIS — M999 Biomechanical lesion, unspecified: Secondary | ICD-10-CM | POA: Diagnosis not present

## 2012-04-17 LAB — POCT INR: INR: 2.5

## 2012-04-23 ENCOUNTER — Other Ambulatory Visit: Payer: Self-pay | Admitting: Internal Medicine

## 2012-04-26 DIAGNOSIS — M6281 Muscle weakness (generalized): Secondary | ICD-10-CM | POA: Diagnosis not present

## 2012-04-26 DIAGNOSIS — R269 Unspecified abnormalities of gait and mobility: Secondary | ICD-10-CM | POA: Diagnosis not present

## 2012-05-02 DIAGNOSIS — M6281 Muscle weakness (generalized): Secondary | ICD-10-CM | POA: Diagnosis not present

## 2012-05-02 DIAGNOSIS — R269 Unspecified abnormalities of gait and mobility: Secondary | ICD-10-CM | POA: Diagnosis not present

## 2012-05-12 DIAGNOSIS — M6281 Muscle weakness (generalized): Secondary | ICD-10-CM | POA: Diagnosis not present

## 2012-05-12 DIAGNOSIS — R269 Unspecified abnormalities of gait and mobility: Secondary | ICD-10-CM | POA: Diagnosis not present

## 2012-05-17 ENCOUNTER — Other Ambulatory Visit (HOSPITAL_COMMUNITY): Payer: Self-pay | Admitting: Rheumatology

## 2012-05-17 DIAGNOSIS — M545 Low back pain: Secondary | ICD-10-CM | POA: Diagnosis not present

## 2012-05-17 DIAGNOSIS — R5383 Other fatigue: Secondary | ICD-10-CM | POA: Diagnosis not present

## 2012-05-17 DIAGNOSIS — M2550 Pain in unspecified joint: Secondary | ICD-10-CM | POA: Diagnosis not present

## 2012-05-17 DIAGNOSIS — M25569 Pain in unspecified knee: Secondary | ICD-10-CM | POA: Diagnosis not present

## 2012-05-17 DIAGNOSIS — IMO0001 Reserved for inherently not codable concepts without codable children: Secondary | ICD-10-CM | POA: Diagnosis not present

## 2012-05-17 DIAGNOSIS — IMO0002 Reserved for concepts with insufficient information to code with codable children: Secondary | ICD-10-CM

## 2012-05-17 DIAGNOSIS — M25559 Pain in unspecified hip: Secondary | ICD-10-CM | POA: Diagnosis not present

## 2012-05-17 DIAGNOSIS — R5381 Other malaise: Secondary | ICD-10-CM | POA: Diagnosis not present

## 2012-05-19 DIAGNOSIS — R269 Unspecified abnormalities of gait and mobility: Secondary | ICD-10-CM | POA: Diagnosis not present

## 2012-05-19 DIAGNOSIS — M6281 Muscle weakness (generalized): Secondary | ICD-10-CM | POA: Diagnosis not present

## 2012-05-22 ENCOUNTER — Other Ambulatory Visit: Payer: Self-pay | Admitting: Internal Medicine

## 2012-05-22 ENCOUNTER — Ambulatory Visit (INDEPENDENT_AMBULATORY_CARE_PROVIDER_SITE_OTHER): Payer: Medicare Other

## 2012-05-22 DIAGNOSIS — I4891 Unspecified atrial fibrillation: Secondary | ICD-10-CM | POA: Diagnosis not present

## 2012-05-22 DIAGNOSIS — Z7901 Long term (current) use of anticoagulants: Secondary | ICD-10-CM | POA: Diagnosis not present

## 2012-05-22 DIAGNOSIS — R269 Unspecified abnormalities of gait and mobility: Secondary | ICD-10-CM | POA: Diagnosis not present

## 2012-05-22 DIAGNOSIS — M6281 Muscle weakness (generalized): Secondary | ICD-10-CM | POA: Diagnosis not present

## 2012-05-22 DIAGNOSIS — M5137 Other intervertebral disc degeneration, lumbosacral region: Secondary | ICD-10-CM | POA: Diagnosis not present

## 2012-05-22 DIAGNOSIS — M999 Biomechanical lesion, unspecified: Secondary | ICD-10-CM | POA: Diagnosis not present

## 2012-05-23 ENCOUNTER — Ambulatory Visit (HOSPITAL_COMMUNITY): Payer: Medicare Other

## 2012-05-23 DIAGNOSIS — R269 Unspecified abnormalities of gait and mobility: Secondary | ICD-10-CM | POA: Diagnosis not present

## 2012-05-23 DIAGNOSIS — M6281 Muscle weakness (generalized): Secondary | ICD-10-CM | POA: Diagnosis not present

## 2012-05-24 DIAGNOSIS — G4720 Circadian rhythm sleep disorder, unspecified type: Secondary | ICD-10-CM | POA: Diagnosis not present

## 2012-05-24 DIAGNOSIS — G4752 REM sleep behavior disorder: Secondary | ICD-10-CM | POA: Diagnosis not present

## 2012-05-24 DIAGNOSIS — G4730 Sleep apnea, unspecified: Secondary | ICD-10-CM | POA: Diagnosis not present

## 2012-05-24 DIAGNOSIS — G2581 Restless legs syndrome: Secondary | ICD-10-CM | POA: Diagnosis not present

## 2012-05-24 DIAGNOSIS — IMO0002 Reserved for concepts with insufficient information to code with codable children: Secondary | ICD-10-CM | POA: Diagnosis not present

## 2012-06-06 DIAGNOSIS — M999 Biomechanical lesion, unspecified: Secondary | ICD-10-CM | POA: Diagnosis not present

## 2012-06-06 DIAGNOSIS — M5137 Other intervertebral disc degeneration, lumbosacral region: Secondary | ICD-10-CM | POA: Diagnosis not present

## 2012-06-13 DIAGNOSIS — M6281 Muscle weakness (generalized): Secondary | ICD-10-CM | POA: Diagnosis not present

## 2012-06-13 DIAGNOSIS — R269 Unspecified abnormalities of gait and mobility: Secondary | ICD-10-CM | POA: Diagnosis not present

## 2012-06-15 DIAGNOSIS — M6281 Muscle weakness (generalized): Secondary | ICD-10-CM | POA: Diagnosis not present

## 2012-06-15 DIAGNOSIS — R269 Unspecified abnormalities of gait and mobility: Secondary | ICD-10-CM | POA: Diagnosis not present

## 2012-06-27 ENCOUNTER — Ambulatory Visit (INDEPENDENT_AMBULATORY_CARE_PROVIDER_SITE_OTHER): Payer: Medicare Other | Admitting: Pharmacist

## 2012-06-27 DIAGNOSIS — I4891 Unspecified atrial fibrillation: Secondary | ICD-10-CM | POA: Diagnosis not present

## 2012-06-27 DIAGNOSIS — Z7901 Long term (current) use of anticoagulants: Secondary | ICD-10-CM | POA: Diagnosis not present

## 2012-06-27 DIAGNOSIS — R269 Unspecified abnormalities of gait and mobility: Secondary | ICD-10-CM | POA: Diagnosis not present

## 2012-06-27 DIAGNOSIS — M6281 Muscle weakness (generalized): Secondary | ICD-10-CM | POA: Diagnosis not present

## 2012-06-29 DIAGNOSIS — M999 Biomechanical lesion, unspecified: Secondary | ICD-10-CM | POA: Diagnosis not present

## 2012-06-29 DIAGNOSIS — M5137 Other intervertebral disc degeneration, lumbosacral region: Secondary | ICD-10-CM | POA: Diagnosis not present

## 2012-07-04 DIAGNOSIS — M6281 Muscle weakness (generalized): Secondary | ICD-10-CM | POA: Diagnosis not present

## 2012-07-04 DIAGNOSIS — R269 Unspecified abnormalities of gait and mobility: Secondary | ICD-10-CM | POA: Diagnosis not present

## 2012-07-06 DIAGNOSIS — R269 Unspecified abnormalities of gait and mobility: Secondary | ICD-10-CM | POA: Diagnosis not present

## 2012-07-06 DIAGNOSIS — M6281 Muscle weakness (generalized): Secondary | ICD-10-CM | POA: Diagnosis not present

## 2012-07-11 DIAGNOSIS — M6281 Muscle weakness (generalized): Secondary | ICD-10-CM | POA: Diagnosis not present

## 2012-07-11 DIAGNOSIS — R269 Unspecified abnormalities of gait and mobility: Secondary | ICD-10-CM | POA: Diagnosis not present

## 2012-07-12 ENCOUNTER — Encounter: Payer: Self-pay | Admitting: Pharmacist

## 2012-07-13 DIAGNOSIS — M6281 Muscle weakness (generalized): Secondary | ICD-10-CM | POA: Diagnosis not present

## 2012-07-13 DIAGNOSIS — R269 Unspecified abnormalities of gait and mobility: Secondary | ICD-10-CM | POA: Diagnosis not present

## 2012-07-18 DIAGNOSIS — R269 Unspecified abnormalities of gait and mobility: Secondary | ICD-10-CM | POA: Diagnosis not present

## 2012-07-18 DIAGNOSIS — M6281 Muscle weakness (generalized): Secondary | ICD-10-CM | POA: Diagnosis not present

## 2012-07-20 DIAGNOSIS — M6281 Muscle weakness (generalized): Secondary | ICD-10-CM | POA: Diagnosis not present

## 2012-07-20 DIAGNOSIS — R269 Unspecified abnormalities of gait and mobility: Secondary | ICD-10-CM | POA: Diagnosis not present

## 2012-07-27 ENCOUNTER — Ambulatory Visit (INDEPENDENT_AMBULATORY_CARE_PROVIDER_SITE_OTHER): Payer: Medicare Other | Admitting: *Deleted

## 2012-07-27 DIAGNOSIS — I4891 Unspecified atrial fibrillation: Secondary | ICD-10-CM | POA: Diagnosis not present

## 2012-07-27 DIAGNOSIS — Z7901 Long term (current) use of anticoagulants: Secondary | ICD-10-CM | POA: Diagnosis not present

## 2012-07-27 DIAGNOSIS — M6281 Muscle weakness (generalized): Secondary | ICD-10-CM | POA: Diagnosis not present

## 2012-07-27 DIAGNOSIS — R269 Unspecified abnormalities of gait and mobility: Secondary | ICD-10-CM | POA: Diagnosis not present

## 2012-07-28 DIAGNOSIS — R269 Unspecified abnormalities of gait and mobility: Secondary | ICD-10-CM | POA: Diagnosis not present

## 2012-07-28 DIAGNOSIS — M6281 Muscle weakness (generalized): Secondary | ICD-10-CM | POA: Diagnosis not present

## 2012-08-02 DIAGNOSIS — M999 Biomechanical lesion, unspecified: Secondary | ICD-10-CM | POA: Diagnosis not present

## 2012-08-02 DIAGNOSIS — M5137 Other intervertebral disc degeneration, lumbosacral region: Secondary | ICD-10-CM | POA: Diagnosis not present

## 2012-08-03 DIAGNOSIS — M6281 Muscle weakness (generalized): Secondary | ICD-10-CM | POA: Diagnosis not present

## 2012-08-03 DIAGNOSIS — R269 Unspecified abnormalities of gait and mobility: Secondary | ICD-10-CM | POA: Diagnosis not present

## 2012-08-08 DIAGNOSIS — M6281 Muscle weakness (generalized): Secondary | ICD-10-CM | POA: Diagnosis not present

## 2012-08-08 DIAGNOSIS — R269 Unspecified abnormalities of gait and mobility: Secondary | ICD-10-CM | POA: Diagnosis not present

## 2012-08-15 DIAGNOSIS — R269 Unspecified abnormalities of gait and mobility: Secondary | ICD-10-CM | POA: Diagnosis not present

## 2012-08-15 DIAGNOSIS — M6281 Muscle weakness (generalized): Secondary | ICD-10-CM | POA: Diagnosis not present

## 2012-08-17 DIAGNOSIS — M5137 Other intervertebral disc degeneration, lumbosacral region: Secondary | ICD-10-CM | POA: Diagnosis not present

## 2012-08-17 DIAGNOSIS — M999 Biomechanical lesion, unspecified: Secondary | ICD-10-CM | POA: Diagnosis not present

## 2012-08-17 DIAGNOSIS — M6281 Muscle weakness (generalized): Secondary | ICD-10-CM | POA: Diagnosis not present

## 2012-08-17 DIAGNOSIS — R269 Unspecified abnormalities of gait and mobility: Secondary | ICD-10-CM | POA: Diagnosis not present

## 2012-08-22 DIAGNOSIS — M6281 Muscle weakness (generalized): Secondary | ICD-10-CM | POA: Diagnosis not present

## 2012-08-22 DIAGNOSIS — R269 Unspecified abnormalities of gait and mobility: Secondary | ICD-10-CM | POA: Diagnosis not present

## 2012-08-23 DIAGNOSIS — G20 Parkinson's disease: Secondary | ICD-10-CM | POA: Diagnosis not present

## 2012-08-23 DIAGNOSIS — M48061 Spinal stenosis, lumbar region without neurogenic claudication: Secondary | ICD-10-CM | POA: Diagnosis not present

## 2012-08-23 DIAGNOSIS — G253 Myoclonus: Secondary | ICD-10-CM | POA: Diagnosis not present

## 2012-08-23 DIAGNOSIS — F0281 Dementia in other diseases classified elsewhere with behavioral disturbance: Secondary | ICD-10-CM | POA: Diagnosis not present

## 2012-08-25 ENCOUNTER — Encounter: Payer: Self-pay | Admitting: Internal Medicine

## 2012-08-25 ENCOUNTER — Other Ambulatory Visit (INDEPENDENT_AMBULATORY_CARE_PROVIDER_SITE_OTHER): Payer: Medicare Other

## 2012-08-25 ENCOUNTER — Ambulatory Visit (INDEPENDENT_AMBULATORY_CARE_PROVIDER_SITE_OTHER): Payer: Medicare Other | Admitting: Internal Medicine

## 2012-08-25 VITALS — BP 122/74 | HR 62 | Temp 97.9°F | Resp 16 | Wt 230.0 lb

## 2012-08-25 DIAGNOSIS — R21 Rash and other nonspecific skin eruption: Secondary | ICD-10-CM

## 2012-08-25 DIAGNOSIS — F068 Other specified mental disorders due to known physiological condition: Secondary | ICD-10-CM | POA: Diagnosis not present

## 2012-08-25 DIAGNOSIS — Z23 Encounter for immunization: Secondary | ICD-10-CM | POA: Diagnosis not present

## 2012-08-25 DIAGNOSIS — G20 Parkinson's disease: Secondary | ICD-10-CM

## 2012-08-25 DIAGNOSIS — E785 Hyperlipidemia, unspecified: Secondary | ICD-10-CM

## 2012-08-25 DIAGNOSIS — G20C Parkinsonism, unspecified: Secondary | ICD-10-CM

## 2012-08-25 DIAGNOSIS — I10 Essential (primary) hypertension: Secondary | ICD-10-CM

## 2012-08-25 HISTORY — DX: Parkinsonism, unspecified: G20.C

## 2012-08-25 HISTORY — DX: Parkinson's disease: G20

## 2012-08-25 LAB — URINALYSIS, ROUTINE W REFLEX MICROSCOPIC
Bilirubin Urine: NEGATIVE
Ketones, ur: NEGATIVE
Leukocytes, UA: NEGATIVE
Nitrite: NEGATIVE
Specific Gravity, Urine: 1.02 (ref 1.000–1.030)
Urobilinogen, UA: 0.2 (ref 0.0–1.0)
pH: 7.5 (ref 5.0–8.0)

## 2012-08-25 LAB — HEPATIC FUNCTION PANEL
ALT: 6 U/L (ref 0–53)
AST: 26 U/L (ref 0–37)
Albumin: 3.4 g/dL — ABNORMAL LOW (ref 3.5–5.2)
Alkaline Phosphatase: 58 U/L (ref 39–117)
Total Protein: 6.4 g/dL (ref 6.0–8.3)

## 2012-08-25 LAB — CBC WITH DIFFERENTIAL/PLATELET
Basophils Absolute: 0 10*3/uL (ref 0.0–0.1)
Eosinophils Relative: 1 % (ref 0.0–5.0)
Monocytes Relative: 7.3 % (ref 3.0–12.0)
Neutrophils Relative %: 81.4 % — ABNORMAL HIGH (ref 43.0–77.0)
Platelets: 224 10*3/uL (ref 150.0–400.0)
RDW: 14.6 % (ref 11.5–14.6)
WBC: 8.1 10*3/uL (ref 4.5–10.5)

## 2012-08-25 LAB — BASIC METABOLIC PANEL
BUN: 14 mg/dL (ref 6–23)
CO2: 33 mEq/L — ABNORMAL HIGH (ref 19–32)
Chloride: 102 mEq/L (ref 96–112)
Glucose, Bld: 98 mg/dL (ref 70–99)
Potassium: 3.9 mEq/L (ref 3.5–5.1)

## 2012-08-25 LAB — LIPID PANEL
HDL: 36.9 mg/dL — ABNORMAL LOW (ref >39.00)
VLDL: 76.4 mg/dL — ABNORMAL HIGH (ref 0.0–40.0)

## 2012-08-25 LAB — TSH: TSH: 1.31 u[IU]/mL (ref 0.35–5.50)

## 2012-08-25 NOTE — Assessment & Plan Note (Addendum)
Meets criteria for need for 24 hr care while wife for surgury and recovery;  Needs care plan  - to refer to home health for assist

## 2012-08-25 NOTE — Progress Notes (Signed)
Subjective:    Patient ID: [name] [name], male    DOB: 1935/08/25, 77 y.o.   MRN: [id number]  HPI  Here to f/u with wife present;  Wife will need partial nephrectomy soon, and he needs 24hr while she is hospd and recovering  He has long term care ins and needs referral,.  Has difficulty standing/walking/sitting/transferring - needs at least partial assist each time, and this relates to all ADL's including bathing, dressing, toileting, transferring.  No longer incontinence with vesicare use.   Also has irritation area to the right thigh he tends to pick at during the day, itchy, and would like tx.  Pt denies chest pain, increased sob or doe, wheezing, orthopnea, PND, increased LE swelling, palpitations, dizziness or syncope.  Pt denies new neurological symptoms such as new headache, or facial or extremity weakness or numbness   Pt denies polydipsia, polyuria.  Dementia overall stable symptomatically with gradual worsening at best, and not assoc with behavioral changes such as hallucinations, paranoia, or agitation.   ALso for flu shot and shingles shot today as wife knows they are covered.  No new complaints Past Medical History  Diagnosis Date  . ONYCHOMYCOSIS, TOENAILS 06/27/2008  . HYPERLIPIDEMIA 07/28/2007  . ANXIETY 09/28/2007  . OBSTRUCTIVE SLEEP APNEA 01/01/2008  . RESTLESS LEGS SYNDROME 12/26/2007  . HEARING LOSS 06/27/2008  . HYPERTENSION 07/28/2007  . MYOCARDIAL INFARCTION, HX OF 07/28/2007  . CORONARY ARTERY DISEASE 07/28/2007  . Atrial fibrillation 09/28/2007  . SINUSITIS- ACUTE-NOS 10/10/2007  . ALLERGIC RHINITIS 09/28/2007  . CHOLELITHIASIS 09/28/2007  . BENIGN PROSTATIC HYPERTROPHY 09/28/2007  . CELLULITIS, HAND, LEFT 09/24/2010  . SYNCOPE 07/28/2007  . INSOMNIA-SLEEP DISORDER-UNSPEC 04/03/2009  . HYPERSOMNIA WITH SLEEP APNEA UNSPECIFIED 12/26/2007  . FATIGUE 01/01/2008  . DYSPNEA 04/03/2009  . Wheezing 11/06/2009  . ANGIOEDEMA 11/06/2009  . COLON CANCER, HX OF 07/28/2007  . PROSTATE  CANCER, HX OF 07/28/2007  . COLONIC POLYPS, HX OF 07/28/2007  . Long term current use of anticoagulant 12/22/2010  . Asthma   . DEMENTIA   . Spinal stenosis   . History of atrial fibrillation   . Lumbar stenosis 10/30/2011  . CAD (coronary artery disease)     Severe three-vessel coronary disease. LIMA to the LAD patent. SVG to PDA patent. SVG to OM 3 patent. Preserved ejection fraction. 2008.  . Cancer   . Prostate cancer   . Colon cancer   . Thyroid disease   . Arthritis   . Pneumonia   . 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 Prostatectomy 2001  . Cardioversion 2007  . Stress cardiolite 02/08/2007  . Schocardiograph 10/07/2006  . Right thyroid lobectomy   . Coronary artery bypass graft 1995    x 3  . Hernia repair 1998  . Colonoscopy 2010    last done 2010- clear  . Cardiac electrophysiology study and ablation   . Thyroidectomy, partial     reports that he has never smoked. He does not have any smokeless tobacco history on file. He reports that he does not drink alcohol or use illicit drugs. family history includes Coronary artery disease in his father and sister and Hypertension in his other. Allergies  Allergen Reactions  . Lisinopril Swelling   Current Outpatient Prescriptions on File Prior to Visit  Medication Sig Dispense Refill  . albuterol (PROAIR HFA) 108 (90 BASE) MCG/ACT inhaler Inhale 2 puffs into the lungs every 4 (four) hours as  needed. For wheezing  3 Inhaler  3  . amLODipine (NORVASC) 5 MG tablet TAKE 1 TABLET (5 MG TOTAL) DAILY  90 tablet  3  . atenolol (TENORMIN) 50 MG tablet Take 1 tablet (50 mg total) by mouth at bedtime.  90 tablet  3  . buPROPion (WELLBUTRIN XL) 150 MG 24 hr tablet Take 1 tablet (150 mg total) by mouth at bedtime.  90 tablet  3  . carbidopa-levodopa (SINEMET CR) 25-100 MG per tablet Take 1 tablet by mouth. Before meals      . Cholecalciferol (VITAMIN D3) 1000 UNITS CAPS  Take 1 capsule by mouth every morning.       . clonazePAM (KLONOPIN) 1 MG tablet Take 1 tablet (1 mg total) by mouth at bedtime as needed for anxiety.  30 tablet  5  . desloratadine (CLARINEX) 5 MG tablet Take 1 tablet (5 mg total) by mouth every morning.  90 tablet  3  . docusate sodium (COLACE) 100 MG capsule Take 200 mg by mouth at bedtime.       . donepezil (ARICEPT) 23 MG TABS tablet Take 23 mg by mouth at bedtime.      Marland Kitchen FLUoxetine (PROZAC) 40 MG capsule Take 1 capsule (40 mg total) by mouth every morning.  90 capsule  3  . fluticasone (VERAMYST) 27.5 MCG/SPRAY nasal spray Place 1 spray into the nose 2 (two) times daily.      . Fluticasone-Salmeterol (ADVAIR) 250-50 MCG/DOSE AEPB Inhale 1 puff into the lungs every morning.  60 each  1  . guaifenesin (HUMIBID E) 400 MG TABS Take 400 mg by mouth every 4 (four) hours as needed. For chest congestion      . L-Methylfolate-B12-B6-B2 (CEREFOLIN) 04-08-49-5 MG TABS TAKE 1 TABLET DAILY  90 each  3  . levocetirizine (XYZAL) 5 MG tablet Take 1 tablet (5 mg total) by mouth every evening.  90 tablet  3  . Multiple Vitamin (MULTIVITAMINS PO) Take 1 tablet by mouth at bedtime.       Marland Kitchen nystatin (MYCOSTATIN) powder Apply 1 Units topically 2 (two) times daily as needed. Apply to groin area for yeast infections.      . primidone (MYSOLINE) 50 MG tablet Take 50 mg by mouth 2 (two) times daily.       . solifenacin (VESICARE) 5 MG tablet Take 1 tablet (5 mg total) by mouth every morning.  90 tablet  3  . celecoxib (CELEBREX) 200 MG capsule Take 1 capsule (200 mg total) by mouth every morning.  90 capsule  3  . clonazePAM (KLONOPIN) 1 MG tablet Take 1 tablet (1 mg total) by mouth at bedtime.  90 tablet  1  . donepezil (ARICEPT) 10 MG tablet Take 23 mg by mouth every morning. 23 mg once daily       . predniSONE (DELTASONE) 5 MG tablet Take a total of 6mg  dailey. On tapering dose for the month      . warfarin (COUMADIN) 5 MG tablet Take 5 mg tues,wed.thursday  saturday daily and 7.5mg  on Monday and friday       Review of Systems  Constitutional: Negative for diaphoresis and unexpected weight change.  HENT: Negative for tinnitus.   Eyes: Negative for photophobia and visual disturbance.  Respiratory: Negative for choking and stridor.   Gastrointestinal: Negative for vomiting and blood in stool.  Genitourinary: Negative for hematuria and decreased urine volume.  Musculoskeletal: Negative for gait problem.  Skin: Negative for color change and wound.  Neurological: Negative for tremors and numbness.  Psychiatric/Behavioral: Negative for decreased concentration. The patient is not hyperactive.       Objective:   Physical Exam Physical Exam  VS noted Constitutional: Pt appears well-developed and well-nourished.  HENT: Head: Normocephalic.  Right Ear: External ear normal.  Left Ear: External ear normal.  Eyes: Conjunctivae and EOM are normal. Pupils are equal, round, and reactive to light.  Neck: Normal range of motion. Neck supple.  Cardiovascular: Normal rate and regular rhythm.   Pulmonary/Chest: Effort normal and breath sounds normal.  Abd:  Soft, NT, non-distended, + BS Neurological: Pt is alert. Baseline confusion persists , motor/gait intact Skin: Skin is warm. No erythema. except for 7-8 mm area right mid ant thigh superficial irritated area, no cellulitis Psychiatric: Pt behavior is normal.    Assessment & Plan:

## 2012-08-25 NOTE — Patient Instructions (Addendum)
You had the flu shot and shingles shot. You will be contacted regarding the referral for: referral to [facility] Continue all other medications as before Please have the pharmacy call with any refills you may need. Please go to LAB in the Basement for the blood and/or urine tests to be done today You will be contacted by phone if any changes need to be made immediately.  Otherwise, you will receive a letter about your results with an explanation. Please remember to sign up for My Chart at your earliest convenience, as this will be important to you in the future with finding out test results.

## 2012-08-26 ENCOUNTER — Encounter: Payer: Self-pay | Admitting: Internal Medicine

## 2012-08-26 DIAGNOSIS — R21 Rash and other nonspecific skin eruption: Secondary | ICD-10-CM | POA: Insufficient documentation

## 2012-08-26 NOTE — Assessment & Plan Note (Signed)
Mild, for otc cortison prn,  to f/u any worsening symptoms or concerns

## 2012-08-26 NOTE — Assessment & Plan Note (Signed)
stable overall by hx and exam, most recent data reviewed with pt, and pt to continue medical treatment as before Lab Results  Component Value Date   LDLCALC 58 01/01/2008

## 2012-08-26 NOTE — Assessment & Plan Note (Signed)
stable overall by hx and exam, most recent data reviewed with pt, and pt to continue medical treatment as before BP Readings from Last 3 Encounters:  08/25/12 122/74  02/29/12 112/54  12/23/11 120/70

## 2012-08-29 ENCOUNTER — Ambulatory Visit: Payer: Medicare Other | Admitting: Internal Medicine

## 2012-08-29 DIAGNOSIS — R269 Unspecified abnormalities of gait and mobility: Secondary | ICD-10-CM | POA: Diagnosis not present

## 2012-08-29 DIAGNOSIS — M6281 Muscle weakness (generalized): Secondary | ICD-10-CM | POA: Diagnosis not present

## 2012-08-31 DIAGNOSIS — R269 Unspecified abnormalities of gait and mobility: Secondary | ICD-10-CM | POA: Diagnosis not present

## 2012-08-31 DIAGNOSIS — M6281 Muscle weakness (generalized): Secondary | ICD-10-CM | POA: Diagnosis not present

## 2012-09-04 ENCOUNTER — Ambulatory Visit (INDEPENDENT_AMBULATORY_CARE_PROVIDER_SITE_OTHER): Payer: Medicare Other

## 2012-09-04 DIAGNOSIS — M6281 Muscle weakness (generalized): Secondary | ICD-10-CM | POA: Diagnosis not present

## 2012-09-04 DIAGNOSIS — Z7901 Long term (current) use of anticoagulants: Secondary | ICD-10-CM | POA: Diagnosis not present

## 2012-09-04 DIAGNOSIS — I4891 Unspecified atrial fibrillation: Secondary | ICD-10-CM

## 2012-09-04 DIAGNOSIS — R269 Unspecified abnormalities of gait and mobility: Secondary | ICD-10-CM | POA: Diagnosis not present

## 2012-09-06 DIAGNOSIS — IMO0001 Reserved for inherently not codable concepts without codable children: Secondary | ICD-10-CM | POA: Diagnosis not present

## 2012-09-06 DIAGNOSIS — M19049 Primary osteoarthritis, unspecified hand: Secondary | ICD-10-CM | POA: Diagnosis not present

## 2012-09-06 DIAGNOSIS — M1710 Unilateral primary osteoarthritis, unspecified knee: Secondary | ICD-10-CM | POA: Diagnosis not present

## 2012-09-07 ENCOUNTER — Other Ambulatory Visit: Payer: Self-pay

## 2012-09-07 MED ORDER — ALPRAZOLAM 0.5 MG PO TBDP: 0.5000 mg | ORAL_TABLET | Freq: Three times a day (TID) | ORAL | Status: DC | PRN

## 2012-09-07 MED ORDER — ALPRAZOLAM 0.5 MG PO TABS: 0.5000 mg | ORAL_TABLET | Freq: Three times a day (TID) | ORAL | Status: DC | PRN

## 2012-09-07 NOTE — Telephone Encounter (Signed)
Faxed hardcopy to pharmacy. 

## 2012-09-07 NOTE — Telephone Encounter (Signed)
Pharmacy requesting refill on Xanax 0.5 mg.  Medication not on current list.  Advise please

## 2012-09-07 NOTE — Telephone Encounter (Signed)
Done hardcopy to robin  

## 2012-09-08 DIAGNOSIS — M6281 Muscle weakness (generalized): Secondary | ICD-10-CM | POA: Diagnosis not present

## 2012-09-08 DIAGNOSIS — R269 Unspecified abnormalities of gait and mobility: Secondary | ICD-10-CM | POA: Diagnosis not present

## 2012-09-15 DIAGNOSIS — M999 Biomechanical lesion, unspecified: Secondary | ICD-10-CM | POA: Diagnosis not present

## 2012-09-15 DIAGNOSIS — M5137 Other intervertebral disc degeneration, lumbosacral region: Secondary | ICD-10-CM | POA: Diagnosis not present

## 2012-09-24 IMAGING — CR DG CHEST 2V
2 series · 2 of 2 positions shown · non-contrast
Comparison: 09/21/2011

CLINICAL DATA: Shortness of breath, pneumonia

CHEST - 2 VIEW

[w chest lat]
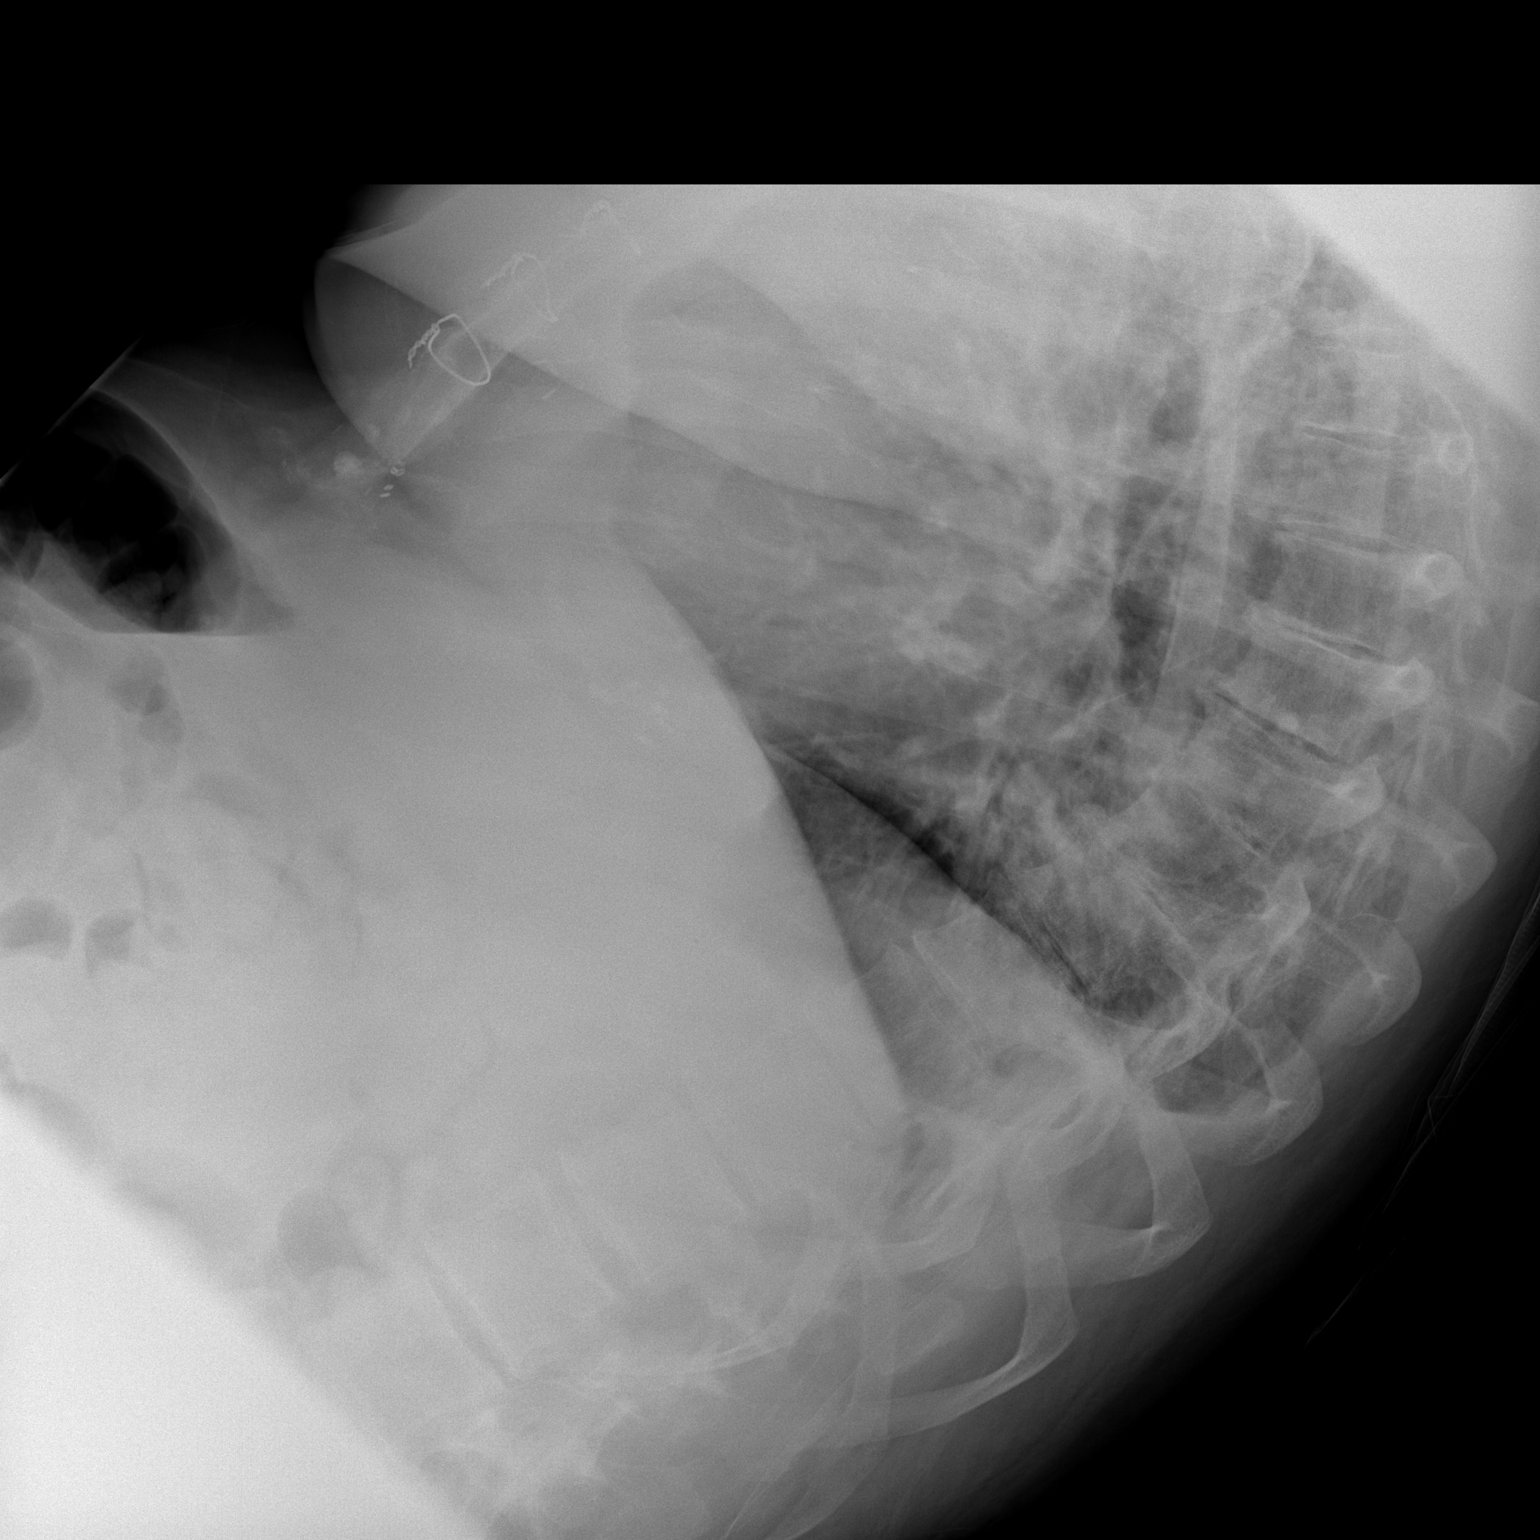

[x chest ap]
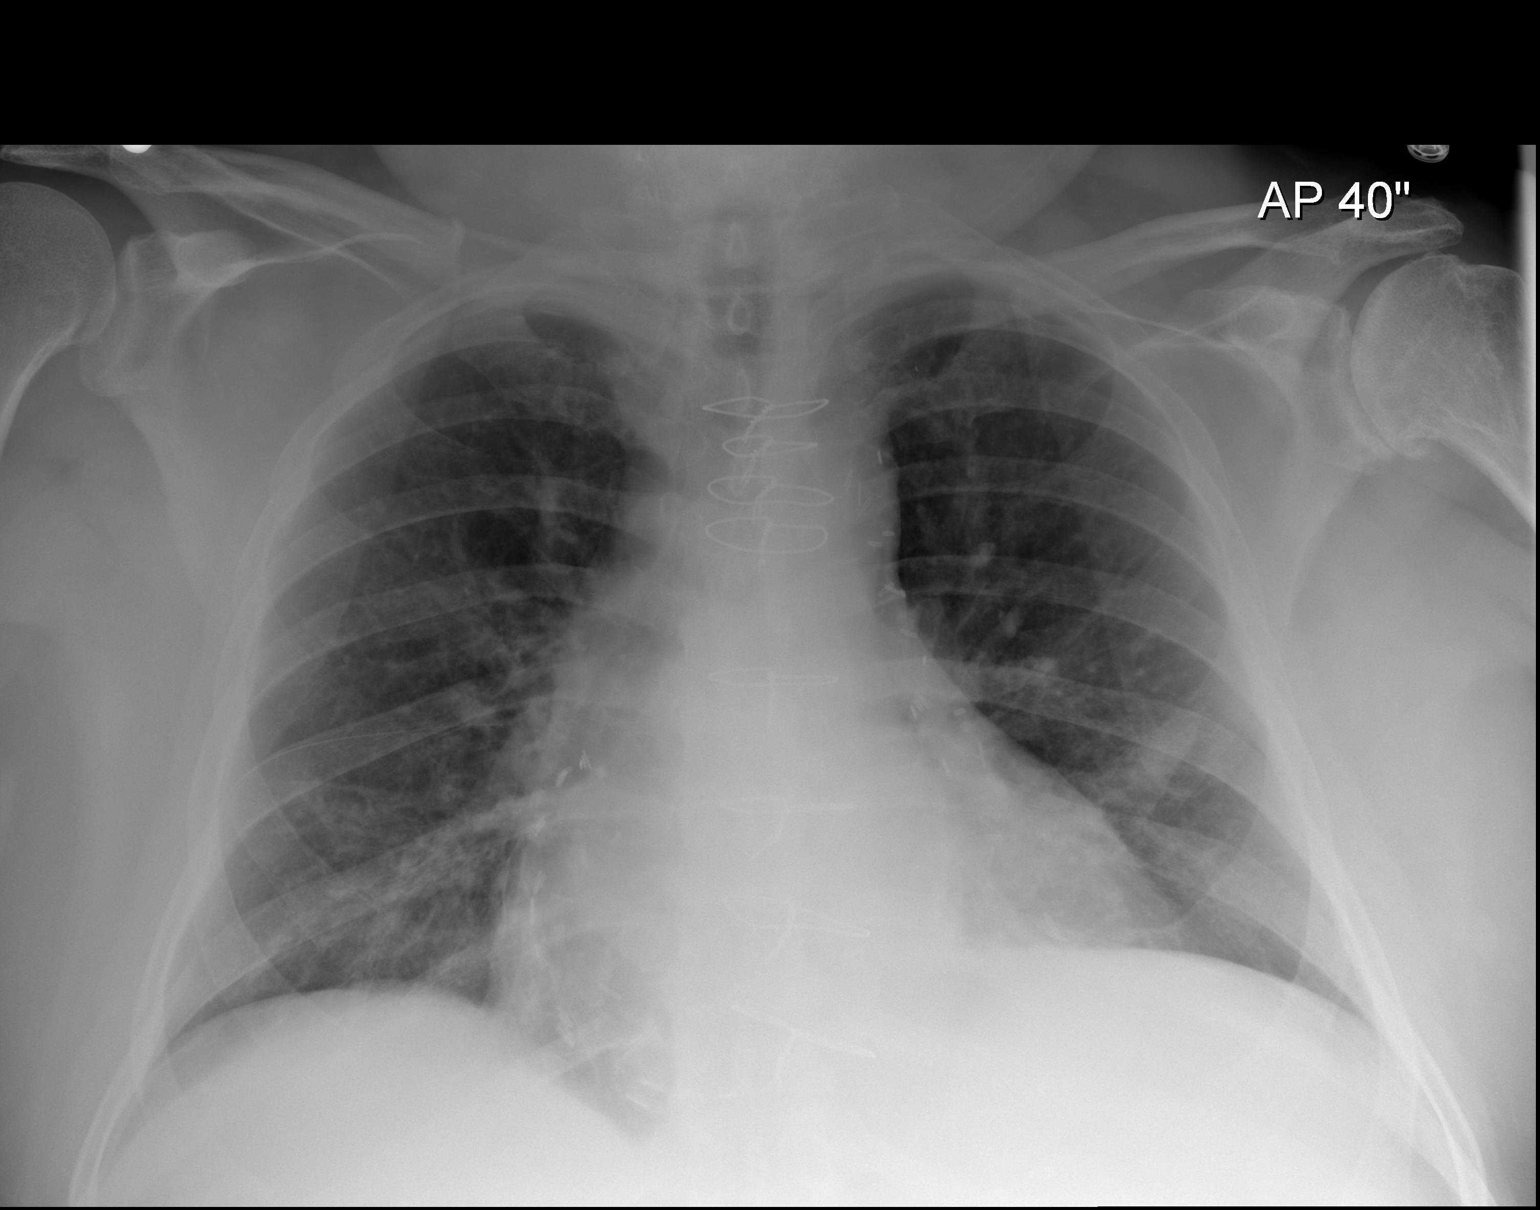

[2 of 2 positions shown; findings below may reference images not displayed]

FINDINGS: Mild patchy bilateral lower lobe opacities, likely
atelectasis.  Left lower lobe pneumonia is not entirely excluded.
No pleural effusion or pneumothorax.

Cardiomegaly.  Postsurgical changes related to prior CABG.
IMPRESSION: Mild patchy bilateral lower lobe opacities, likely atelectasis.
Left lower lobe pneumonia is not entirely excluded.

## 2012-10-03 DIAGNOSIS — M999 Biomechanical lesion, unspecified: Secondary | ICD-10-CM | POA: Diagnosis not present

## 2012-10-03 DIAGNOSIS — M5137 Other intervertebral disc degeneration, lumbosacral region: Secondary | ICD-10-CM | POA: Diagnosis not present

## 2012-10-10 DIAGNOSIS — M5137 Other intervertebral disc degeneration, lumbosacral region: Secondary | ICD-10-CM | POA: Diagnosis not present

## 2012-10-10 DIAGNOSIS — M999 Biomechanical lesion, unspecified: Secondary | ICD-10-CM | POA: Diagnosis not present

## 2012-10-12 DIAGNOSIS — M999 Biomechanical lesion, unspecified: Secondary | ICD-10-CM | POA: Diagnosis not present

## 2012-10-12 DIAGNOSIS — M5137 Other intervertebral disc degeneration, lumbosacral region: Secondary | ICD-10-CM | POA: Diagnosis not present

## 2012-10-17 ENCOUNTER — Ambulatory Visit (INDEPENDENT_AMBULATORY_CARE_PROVIDER_SITE_OTHER): Payer: Medicare Other | Admitting: *Deleted

## 2012-10-17 DIAGNOSIS — Z7901 Long term (current) use of anticoagulants: Secondary | ICD-10-CM | POA: Diagnosis not present

## 2012-10-17 DIAGNOSIS — I4891 Unspecified atrial fibrillation: Secondary | ICD-10-CM

## 2012-10-20 ENCOUNTER — Telehealth: Payer: Self-pay | Admitting: *Deleted

## 2012-10-20 NOTE — Telephone Encounter (Signed)
Done hardcopy to robin  

## 2012-10-20 NOTE — Telephone Encounter (Signed)
Left msg on triage requesting a rx for Home care 24/7 as needed. Called when ready to pick-up...Raechel Chute

## 2012-10-20 NOTE — Telephone Encounter (Signed)
Called pt no answer LMOM rx ready for pick-up.../lmb 

## 2012-10-24 DIAGNOSIS — M5137 Other intervertebral disc degeneration, lumbosacral region: Secondary | ICD-10-CM | POA: Diagnosis not present

## 2012-10-24 DIAGNOSIS — M999 Biomechanical lesion, unspecified: Secondary | ICD-10-CM | POA: Diagnosis not present

## 2012-11-06 DIAGNOSIS — M5137 Other intervertebral disc degeneration, lumbosacral region: Secondary | ICD-10-CM | POA: Diagnosis not present

## 2012-11-06 DIAGNOSIS — M999 Biomechanical lesion, unspecified: Secondary | ICD-10-CM | POA: Diagnosis not present

## 2012-11-12 ENCOUNTER — Other Ambulatory Visit: Payer: Self-pay | Admitting: Internal Medicine

## 2012-11-13 DIAGNOSIS — M5137 Other intervertebral disc degeneration, lumbosacral region: Secondary | ICD-10-CM | POA: Diagnosis not present

## 2012-11-13 DIAGNOSIS — M999 Biomechanical lesion, unspecified: Secondary | ICD-10-CM | POA: Diagnosis not present

## 2012-11-20 DIAGNOSIS — M999 Biomechanical lesion, unspecified: Secondary | ICD-10-CM | POA: Diagnosis not present

## 2012-11-20 DIAGNOSIS — M5137 Other intervertebral disc degeneration, lumbosacral region: Secondary | ICD-10-CM | POA: Diagnosis not present

## 2012-11-24 ENCOUNTER — Other Ambulatory Visit: Payer: Self-pay | Admitting: *Deleted

## 2012-11-24 MED ORDER — LEVOCETIRIZINE DIHYDROCHLORIDE 5 MG PO TABS: 5.0000 mg | ORAL_TABLET | Freq: Every evening | ORAL | Status: DC

## 2012-11-24 NOTE — Telephone Encounter (Signed)
Rx sent 

## 2012-11-27 ENCOUNTER — Telehealth: Payer: Self-pay

## 2012-11-27 NOTE — Telephone Encounter (Signed)
Ok for xyzal only -

## 2012-11-27 NOTE — Telephone Encounter (Signed)
Please advise on xyzal tab 5 mg as is duplicate therapy as desloratadin 5 mg.  Does the patient need to be on both meds.

## 2012-11-27 NOTE — Telephone Encounter (Signed)
Faxed response to pharmacy.  

## 2012-11-29 DIAGNOSIS — M5137 Other intervertebral disc degeneration, lumbosacral region: Secondary | ICD-10-CM | POA: Diagnosis not present

## 2012-11-29 DIAGNOSIS — M999 Biomechanical lesion, unspecified: Secondary | ICD-10-CM | POA: Diagnosis not present

## 2012-12-01 ENCOUNTER — Encounter: Payer: Self-pay | Admitting: Cardiology

## 2012-12-01 ENCOUNTER — Ambulatory Visit (INDEPENDENT_AMBULATORY_CARE_PROVIDER_SITE_OTHER): Payer: Medicare Other | Admitting: *Deleted

## 2012-12-01 ENCOUNTER — Ambulatory Visit (INDEPENDENT_AMBULATORY_CARE_PROVIDER_SITE_OTHER): Payer: Medicare Other | Admitting: Cardiology

## 2012-12-01 VITALS — BP 134/70 | HR 58 | Ht 69.0 in | Wt 231.4 lb

## 2012-12-01 DIAGNOSIS — I4891 Unspecified atrial fibrillation: Secondary | ICD-10-CM

## 2012-12-01 DIAGNOSIS — R55 Syncope and collapse: Secondary | ICD-10-CM

## 2012-12-01 DIAGNOSIS — I252 Old myocardial infarction: Secondary | ICD-10-CM

## 2012-12-01 DIAGNOSIS — I2510 Atherosclerotic heart disease of native coronary artery without angina pectoris: Secondary | ICD-10-CM | POA: Diagnosis not present

## 2012-12-01 DIAGNOSIS — I10 Essential (primary) hypertension: Secondary | ICD-10-CM

## 2012-12-01 DIAGNOSIS — Z7901 Long term (current) use of anticoagulants: Secondary | ICD-10-CM

## 2012-12-01 LAB — POCT INR: INR: 2.3

## 2012-12-01 MED ORDER — APIXABAN 5 MG PO TABS: 5.0000 mg | ORAL_TABLET | Freq: Two times a day (BID) | ORAL | Status: DC

## 2012-12-01 NOTE — Progress Notes (Signed)
HPI The patient presents for evaluation of CAD.  He has had previous bypass. He has had atrial fibrillation with his last cardioversion in 2011. He's not had any symptomatic paroxysms of this. He is somewhat limited with a new diagnosis of Parkinson's disease. He actually was taken off of Lipitor which was thought possibly to be the etiology of his Parkinson's . He denies any chest pressure, neck or arm discomfort. He's not having any palpitations, presyncope or syncope. He's had no PND or orthopnea.  He was last seen prior to possible back surgery and I did a stress perfusion study last year. This was negative but he never had the back surgery.  Allergies  Allergen Reactions  . Lisinopril Swelling    Current Outpatient Prescriptions  Medication Sig Dispense Refill  . albuterol (PROAIR HFA) 108 (90 BASE) MCG/ACT inhaler Inhale 2 puffs into the lungs every 4 (four) hours as needed. For wheezing  3 Inhaler  3  . ALPRAZolam (XANAX) 0.5 MG tablet Take 1 tablet (0.5 mg total) by mouth 3 (three) times daily as needed.  90 tablet  1  . amLODipine (NORVASC) 5 MG tablet TAKE 1 TABLET (5 MG TOTAL) DAILY  90 tablet  3  . atenolol (TENORMIN) 50 MG tablet Take 1 tablet (50 mg total) by mouth at bedtime.  90 tablet  3  . buPROPion (WELLBUTRIN XL) 150 MG 24 hr tablet Take 1 tablet (150 mg total) by mouth at bedtime.  90 tablet  3  . carbidopa-levodopa (SINEMET CR) 25-100 MG per tablet Take 3 tablets by mouth. Before meals      . Cholecalciferol (VITAMIN D3) 1000 UNITS CAPS Take 1 capsule by mouth every morning.       . clonazePAM (KLONOPIN) 1 MG tablet Take 1 tablet (1 mg total) by mouth at bedtime as needed for anxiety.  30 tablet  5  . donepezil (ARICEPT) 10 MG tablet Take 23 mg by mouth every morning. 23 mg once daily       . FLUoxetine (PROZAC) 40 MG capsule Take 1 capsule (40 mg total) by mouth every morning.  90 capsule  3  . fluticasone (VERAMYST) 27.5 MCG/SPRAY nasal spray Place 1 spray into the  nose 2 (two) times daily.      Marland Kitchen guaifenesin (HUMIBID E) 400 MG TABS Take 400 mg by mouth every 4 (four) hours as needed. For chest congestion      . L-Methylfolate-B12-B6-B2 (CEREFOLIN) 04-08-49-5 MG TABS TAKE 1 TABLET DAILY  90 each  3  . levocetirizine (XYZAL) 5 MG tablet Take 1 tablet (5 mg total) by mouth every evening.  90 tablet  0  . Multiple Vitamin (MULTIVITAMINS PO) Take 1 tablet by mouth at bedtime.       . predniSONE (DELTASONE) 5 MG tablet Take 3 mg by mouth daily. Tapering down 1 mg per month      . primidone (MYSOLINE) 50 MG tablet Take 50 mg by mouth 2 (two) times daily.       . solifenacin (VESICARE) 5 MG tablet Take 1 tablet (5 mg total) by mouth every morning.  90 tablet  3  . warfarin (COUMADIN) 5 MG tablet Take 5 mg tues,wed.thursday saturday daily and 7.5mg  on Monday and friday      . clonazePAM (KLONOPIN) 1 MG tablet Take 1 tablet (1 mg total) by mouth at bedtime.  90 tablet  1    Past Medical History  Diagnosis Date  . ONYCHOMYCOSIS, TOENAILS 06/27/2008  .  HYPERLIPIDEMIA 07/28/2007  . ANXIETY 09/28/2007  . OBSTRUCTIVE SLEEP APNEA 01/01/2008  . RESTLESS LEGS SYNDROME 12/26/2007  . HEARING LOSS 06/27/2008  . HYPERTENSION 07/28/2007  . MYOCARDIAL INFARCTION, HX OF 07/28/2007  . CORONARY ARTERY DISEASE 07/28/2007  . Atrial fibrillation 09/28/2007  . SINUSITIS- ACUTE-NOS 10/10/2007  . ALLERGIC RHINITIS 09/28/2007  . CHOLELITHIASIS 09/28/2007  . BENIGN PROSTATIC HYPERTROPHY 09/28/2007  . CELLULITIS, HAND, LEFT 09/24/2010  . SYNCOPE 07/28/2007  . INSOMNIA-SLEEP DISORDER-UNSPEC 04/03/2009  . HYPERSOMNIA WITH SLEEP APNEA UNSPECIFIED 12/26/2007  . FATIGUE 01/01/2008  . DYSPNEA 04/03/2009  . Wheezing 11/06/2009  . ANGIOEDEMA 11/06/2009  . COLON CANCER, HX OF 07/28/2007  . PROSTATE CANCER, HX OF 07/28/2007  . COLONIC POLYPS, HX OF 07/28/2007  . Long term current use of anticoagulant 12/22/2010  . Asthma   . DEMENTIA   . Spinal stenosis   . History of atrial fibrillation   .  Lumbar stenosis 10/30/2011  . CAD (coronary artery disease)     Severe three-vessel coronary disease. LIMA to the LAD patent. SVG to PDA patent. SVG to OM 3 patent. Preserved ejection fraction. 2008.  . Cancer   . Prostate cancer   . Colon cancer   . Thyroid disease   . Arthritis   . Pneumonia   . 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 Prostatectomy 2001  . Cardioversion 2007  . Stress cardiolite 02/08/2007  . Schocardiograph 10/07/2006  . Right thyroid lobectomy   . Coronary artery bypass graft 1995    x 3  . Hernia repair 1998  . Colonoscopy 2010    last done 2010- clear  . Cardiac electrophysiology study and ablation   . Thyroidectomy, partial     ROS: As stated in the HPI and negative for all other systems.  PHYSICAL EXAM BP 134/70  Pulse 58  Ht 5\' 9"  (1.753 m)  Wt 231 lb 6.4 oz (104.962 kg)  BMI 34.17 kg/m2 GENERAL:  Chronically ill appearing. HEENT:  Pupils equal round and reactive, fundi not visualized, oral mucosa unremarkable NECK:  No jugular venous distention, waveform within normal limits, carotid upstroke brisk and symmetric, no bruits, no thyromegaly LYMPHATICS:  No cervical, inguinal adenopathy LUNGS:  Clear to auscultation bilaterally BACK:  No CVA tenderness CHEST:  Well healed sternotomy scar. HEART:  PMI not displaced or sustained,S1 and S2 within normal limits, no S3, no S4, no clicks, no rubs, no murmurs ABD:  Flat, positive bowel sounds normal in frequency in pitch, no bruits, no rebound, no guarding, no midline pulsatile mass, no hepatomegaly, no splenomegaly EXT:  2 plus pulses throughout, no edema, no cyanosis no clubbing SKIN:  No rashes no nodules NEURO:  Cranial nerves II through XII grossly intact, motor grossly intact throughout PSYCH:  Cognitively intact, oriented to person place and time, baseline tremor  EKG:  Sinus rhythm, rate 59, left axis deviation, nonspecific  T-wave flattening.  12/01/2012   ASSESSMENT AND PLAN  ATRIAL FIBRILLATION  The patient seems to be maintaining sinus rhythm. However, with his past risk factors and high thromboembolic risk we will continue anticoagulation. He wants to switch to Eliquis. He has no contraindications to this and needs no downward dose adjustment. He has normal renal function.  CABG The patient has no new sypmtoms.  No further cardiovascular testing is indicated.  We will continue with aggressive risk reduction and meds as listed. He had a stress perfusion study last year which  was negative for any evidence of ischemia.

## 2012-12-01 NOTE — Patient Instructions (Addendum)
Please stop Coumadin once you are finished with this current prescription.  Stop Eliquis 5 mg one twice a day.  Continue all other medications as listed.  Follow up in 1 year with Dr Antoine Poche.  You will receive a letter in the mail 2 months before you are due.  Please call us when you receive this letter to schedule your follow up appointment.

## 2012-12-05 DIAGNOSIS — M5137 Other intervertebral disc degeneration, lumbosacral region: Secondary | ICD-10-CM | POA: Diagnosis not present

## 2012-12-05 DIAGNOSIS — M999 Biomechanical lesion, unspecified: Secondary | ICD-10-CM | POA: Diagnosis not present

## 2012-12-13 DIAGNOSIS — M5137 Other intervertebral disc degeneration, lumbosacral region: Secondary | ICD-10-CM | POA: Diagnosis not present

## 2012-12-13 DIAGNOSIS — M999 Biomechanical lesion, unspecified: Secondary | ICD-10-CM | POA: Diagnosis not present

## 2012-12-18 ENCOUNTER — Ambulatory Visit (INDEPENDENT_AMBULATORY_CARE_PROVIDER_SITE_OTHER)
Admission: RE | Admit: 2012-12-18 | Discharge: 2012-12-18 | Disposition: A | Discharge status: Home / Self Care | Payer: Medicare Other | Source: Ambulatory Visit | Attending: Internal Medicine | Admitting: Internal Medicine

## 2012-12-18 ENCOUNTER — Encounter: Payer: Self-pay | Admitting: Internal Medicine

## 2012-12-18 ENCOUNTER — Ambulatory Visit (INDEPENDENT_AMBULATORY_CARE_PROVIDER_SITE_OTHER): Payer: Medicare Other | Admitting: Internal Medicine

## 2012-12-18 VITALS — BP 130/70 | HR 60 | Temp 98.6°F | Ht 69.0 in | Wt 229.0 lb

## 2012-12-18 DIAGNOSIS — R05 Cough: Secondary | ICD-10-CM | POA: Diagnosis not present

## 2012-12-18 DIAGNOSIS — I10 Essential (primary) hypertension: Secondary | ICD-10-CM | POA: Diagnosis not present

## 2012-12-18 DIAGNOSIS — F3289 Other specified depressive episodes: Secondary | ICD-10-CM

## 2012-12-18 DIAGNOSIS — J209 Acute bronchitis, unspecified: Secondary | ICD-10-CM | POA: Diagnosis not present

## 2012-12-18 DIAGNOSIS — M999 Biomechanical lesion, unspecified: Secondary | ICD-10-CM | POA: Diagnosis not present

## 2012-12-18 DIAGNOSIS — F329 Major depressive disorder, single episode, unspecified: Secondary | ICD-10-CM | POA: Diagnosis not present

## 2012-12-18 DIAGNOSIS — Z7901 Long term (current) use of anticoagulants: Secondary | ICD-10-CM | POA: Diagnosis not present

## 2012-12-18 DIAGNOSIS — M5137 Other intervertebral disc degeneration, lumbosacral region: Secondary | ICD-10-CM | POA: Diagnosis not present

## 2012-12-18 MED ORDER — LEVOFLOXACIN 250 MG PO TABS: 250.0000 mg | ORAL_TABLET | Freq: Every day | ORAL | Status: DC

## 2012-12-18 MED ORDER — LEVOCETIRIZINE DIHYDROCHLORIDE 5 MG PO TABS: 5.0000 mg | ORAL_TABLET | Freq: Every evening | ORAL | Status: DC

## 2012-12-18 NOTE — Assessment & Plan Note (Signed)
Mild to mod, for antibx course,  to f/u any worsening symptoms or concerns, cant ro pna - for cxr 

## 2012-12-18 NOTE — Progress Notes (Signed)
Subjective:     Patient ID: [name] [name], male   DOB: Feb 04, 1935, 78 y.o.   MRN: [id number]  HPI  Here with acute onset mild to mod 2-3 days ST, HA, general weakness and malaise, with prod cough greenish/brown sputum, but Pt denies chest pain, increased sob or doe, wheezing, orthopnea, PND, increased LE swelling, palpitations, dizziness or syncope.   Pt denies polydipsia, polyuria, .  Pt and wife states overall good compliance with meds, is difficult but trying to follow lower cholesterol diet   Pt denies new neurological symptoms such as new headache, or facial or extremity weakness or numbness  Pt denies fever, wt loss, night sweats, loss of appetite, or other constitutional symptoms except for the above. Denies worsening depressive symptoms, suicidal ideation, or panic; has ongoing anxiety, not increased recently.     Past Medical History  Diagnosis Date  . ONYCHOMYCOSIS, TOENAILS 06/27/2008  . HYPERLIPIDEMIA 07/28/2007  . ANXIETY 09/28/2007  . OBSTRUCTIVE SLEEP APNEA 01/01/2008  . RESTLESS LEGS SYNDROME 12/26/2007  . HEARING LOSS 06/27/2008  . HYPERTENSION 07/28/2007  . MYOCARDIAL INFARCTION, HX OF 07/28/2007  . CORONARY ARTERY DISEASE 07/28/2007  . Atrial fibrillation 09/28/2007  . SINUSITIS- ACUTE-NOS 10/10/2007  . ALLERGIC RHINITIS 09/28/2007  . CHOLELITHIASIS 09/28/2007  . BENIGN PROSTATIC HYPERTROPHY 09/28/2007  . CELLULITIS, HAND, LEFT 09/24/2010  . SYNCOPE 07/28/2007  . INSOMNIA-SLEEP DISORDER-UNSPEC 04/03/2009  . HYPERSOMNIA WITH SLEEP APNEA UNSPECIFIED 12/26/2007  . FATIGUE 01/01/2008  . DYSPNEA 04/03/2009  . Wheezing 11/06/2009  . ANGIOEDEMA 11/06/2009  . COLON CANCER, HX OF 07/28/2007  . PROSTATE CANCER, HX OF 07/28/2007  . COLONIC POLYPS, HX OF 07/28/2007  . Long term current use of anticoagulant 12/22/2010  . Asthma   . DEMENTIA   . Spinal stenosis   . History of atrial fibrillation   . Lumbar stenosis 10/30/2011  . CAD (coronary artery disease)     Severe three-vessel coronary  disease. LIMA to the LAD patent. SVG to PDA patent. SVG to OM 3 patent. Preserved ejection fraction. 2008.  . Cancer   . Prostate cancer   . Colon cancer   . Thyroid disease   . Arthritis   . Pneumonia   . 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 Prostatectomy  2001  . Cardioversion  2007  . Stress cardiolite  02/08/2007  . Schocardiograph  10/07/2006  . Right thyroid lobectomy    . Coronary artery bypass graft  1995    x 3  . Hernia repair  1998  . Colonoscopy  2010    last done 2010- clear  . Cardiac electrophysiology study and ablation    . Thyroidectomy, partial      reports that he has never smoked. He does not have any smokeless tobacco history on file. He reports that he does not drink alcohol or use illicit drugs. family history includes Coronary artery disease in his father and sister and Hypertension in his other. Allergies  Allergen Reactions  . Lisinopril Swelling      Review of Systems  Constitutional: Negative for unexpected weight change, or unusual diaphoresis  HENT: Negative for tinnitus.   Eyes: Negative for photophobia and visual disturbance.  Respiratory: Negative for choking and stridor.   Gastrointestinal: Negative for vomiting and blood in stool.  Genitourinary: Negative for hematuria and decreased urine volume.  Musculoskeletal: Negative for acute joint swelling Skin: Negative for color change and wound.  Neurological: Negative  for tremors and numbness other than noted  Psychiatric/Behavioral: Negative for decreased concentration or  hyperactivity.       Objective:   Physical Exam BP 130/70  Pulse 60  Temp(Src) 98.6 F (37 C) (Oral)  Ht 5\' 9"  (1.753 m)  Wt 229 lb (103.874 kg)  BMI 33.8 kg/m2  SpO2 95% VS noted, mild ill Constitutional: Pt appears well-developed and well-nourished.  HENT: Head: NCAT.  Right Ear: External ear normal.  Left Ear: External ear  normal.  Eyes: Conjunctivae and EOM are normal. Pupils are equal, round, and reactive to light.  Neck: Normal range of motion. Neck supple.  Cardiovascular: Normal rate and regular rhythm.   Pulmonary/Chest: Effort normal and breath sounds decrease bilat, no rales or wheezing.  Abd:  Soft, NT, non-distended, +BS Neurological: Pt is alert. Confusion somewhat increased over baseline per wife  Skin: Skin is warm. No erythema.  Psychiatric: Pt behavior is normal. Not agitated or nervous, not depressed affect    Assessment:        Plan:

## 2012-12-18 NOTE — Assessment & Plan Note (Signed)
stable overall by history and exam, recent data reviewed with pt, and pt to continue medical treatment as before,  to f/u any worsening symptoms or concerns BP Readings from Last 3 Encounters:  12/18/12 130/70  12/01/12 134/70  08/25/12 122/74

## 2012-12-18 NOTE — Assessment & Plan Note (Signed)
stable overall by history and exam, recent data reviewed with pt, and pt to continue medical treatment as before,  to f/u any worsening symptoms or concerns Lab Results  Component Value Date   WBC 8.1 08/25/2012   HGB 14.3 08/25/2012   HCT 42.8 08/25/2012   PLT 224.0 08/25/2012   GLUCOSE 98 08/25/2012   CHOL 201* 08/25/2012   TRIG 382.0* 08/25/2012   HDL 36.90* 08/25/2012   LDLDIRECT 88.1 08/25/2012   LDLCALC 58 01/01/2008   ALT 6 08/25/2012   AST 26 08/25/2012   NA 139 08/25/2012   K 3.9 08/25/2012   CL 102 08/25/2012   CREATININE 0.8 08/25/2012   BUN 14 08/25/2012   CO2 33* 08/25/2012   TSH 1.31 08/25/2012   PSA 0.03* 03/04/2010   INR 2.3 12/01/2012   HGBA1C 5.3 03/04/2010

## 2012-12-18 NOTE — Patient Instructions (Addendum)
Please take all new medication as prescribed - the antibiotic (levaquin) Please continue all other medications as before, and refills have been done if requested (xyzal to mailorder) Please go to the XRAY Department in the Basement (go straight as you get off the elevator) for the x-ray testing You will be contacted by phone if any changes need to be made immediately.  Otherwise, you will receive a letter about your results with an explanation, but please check with MyChart first. Thank you for enrolling in MyChart. Please follow the instructions below to securely access your online medical record. MyChart allows you to send messages to your doctor, view your test results, renew your prescriptions, schedule appointments, and more. To Log into My Chart online, please go by [name] [name] or [name] [name] to [name] [name], or download the MyChart App from the [name] [name] .  Your Username is: [name] [name] (pass [name] [name])

## 2012-12-25 DIAGNOSIS — M5137 Other intervertebral disc degeneration, lumbosacral region: Secondary | ICD-10-CM | POA: Diagnosis not present

## 2012-12-25 DIAGNOSIS — M999 Biomechanical lesion, unspecified: Secondary | ICD-10-CM | POA: Diagnosis not present

## 2012-12-26 DIAGNOSIS — G4752 REM sleep behavior disorder: Secondary | ICD-10-CM | POA: Diagnosis not present

## 2012-12-26 DIAGNOSIS — G253 Myoclonus: Secondary | ICD-10-CM | POA: Diagnosis not present

## 2012-12-26 DIAGNOSIS — G20 Parkinson's disease: Secondary | ICD-10-CM | POA: Diagnosis not present

## 2012-12-26 DIAGNOSIS — R269 Unspecified abnormalities of gait and mobility: Secondary | ICD-10-CM | POA: Diagnosis not present

## 2013-01-17 DIAGNOSIS — M999 Biomechanical lesion, unspecified: Secondary | ICD-10-CM | POA: Diagnosis not present

## 2013-01-30 DIAGNOSIS — M353 Polymyalgia rheumatica: Secondary | ICD-10-CM | POA: Diagnosis not present

## 2013-01-30 DIAGNOSIS — R5381 Other malaise: Secondary | ICD-10-CM | POA: Diagnosis not present

## 2013-01-30 DIAGNOSIS — M25519 Pain in unspecified shoulder: Secondary | ICD-10-CM | POA: Diagnosis not present

## 2013-01-30 DIAGNOSIS — Z79899 Other long term (current) drug therapy: Secondary | ICD-10-CM | POA: Diagnosis not present

## 2013-01-30 DIAGNOSIS — M999 Biomechanical lesion, unspecified: Secondary | ICD-10-CM | POA: Diagnosis not present

## 2013-01-30 DIAGNOSIS — M5137 Other intervertebral disc degeneration, lumbosacral region: Secondary | ICD-10-CM | POA: Diagnosis not present

## 2013-01-30 DIAGNOSIS — M2550 Pain in unspecified joint: Secondary | ICD-10-CM | POA: Diagnosis not present

## 2013-01-31 ENCOUNTER — Other Ambulatory Visit: Payer: Self-pay | Admitting: Internal Medicine

## 2013-02-05 DIAGNOSIS — M999 Biomechanical lesion, unspecified: Secondary | ICD-10-CM | POA: Diagnosis not present

## 2013-02-05 DIAGNOSIS — M5137 Other intervertebral disc degeneration, lumbosacral region: Secondary | ICD-10-CM | POA: Diagnosis not present

## 2013-02-12 DIAGNOSIS — M5137 Other intervertebral disc degeneration, lumbosacral region: Secondary | ICD-10-CM | POA: Diagnosis not present

## 2013-02-12 DIAGNOSIS — M999 Biomechanical lesion, unspecified: Secondary | ICD-10-CM | POA: Diagnosis not present

## 2013-02-14 DIAGNOSIS — M999 Biomechanical lesion, unspecified: Secondary | ICD-10-CM | POA: Diagnosis not present

## 2013-02-14 DIAGNOSIS — M5137 Other intervertebral disc degeneration, lumbosacral region: Secondary | ICD-10-CM | POA: Diagnosis not present

## 2013-02-21 ENCOUNTER — Ambulatory Visit: Payer: Self-pay | Admitting: Internal Medicine

## 2013-02-21 DIAGNOSIS — M999 Biomechanical lesion, unspecified: Secondary | ICD-10-CM | POA: Diagnosis not present

## 2013-02-21 DIAGNOSIS — Z7901 Long term (current) use of anticoagulants: Secondary | ICD-10-CM

## 2013-02-21 DIAGNOSIS — M5137 Other intervertebral disc degeneration, lumbosacral region: Secondary | ICD-10-CM | POA: Diagnosis not present

## 2013-02-21 DIAGNOSIS — I4891 Unspecified atrial fibrillation: Secondary | ICD-10-CM

## 2013-02-27 ENCOUNTER — Ambulatory Visit (INDEPENDENT_AMBULATORY_CARE_PROVIDER_SITE_OTHER): Payer: Medicare Other | Admitting: Internal Medicine

## 2013-02-27 ENCOUNTER — Encounter: Payer: Self-pay | Admitting: Internal Medicine

## 2013-02-27 VITALS — BP 120/80 | HR 60 | Temp 98.5°F | Ht 69.0 in | Wt 224.0 lb

## 2013-02-27 DIAGNOSIS — F329 Major depressive disorder, single episode, unspecified: Secondary | ICD-10-CM | POA: Diagnosis not present

## 2013-02-27 DIAGNOSIS — I10 Essential (primary) hypertension: Secondary | ICD-10-CM

## 2013-02-27 DIAGNOSIS — F068 Other specified mental disorders due to known physiological condition: Secondary | ICD-10-CM

## 2013-02-27 DIAGNOSIS — M5137 Other intervertebral disc degeneration, lumbosacral region: Secondary | ICD-10-CM | POA: Diagnosis not present

## 2013-02-27 DIAGNOSIS — M999 Biomechanical lesion, unspecified: Secondary | ICD-10-CM | POA: Diagnosis not present

## 2013-02-27 MED ORDER — CLONAZEPAM 1 MG PO TABS: 1.0000 mg | ORAL_TABLET | Freq: Every day | ORAL | Status: DC

## 2013-02-27 MED ORDER — CLONAZEPAM 1 MG PO TABS: 1.0000 mg | ORAL_TABLET | Freq: Every evening | ORAL | Status: DC | PRN

## 2013-02-27 NOTE — Assessment & Plan Note (Signed)
stable overall by history and exam, recent data reviewed with pt, and pt to continue medical treatment as before,  to f/u any worsening symptoms or concerns Lab Results  Component Value Date   WBC 8.1 08/25/2012   HGB 14.3 08/25/2012   HCT 42.8 08/25/2012   PLT 224.0 08/25/2012   GLUCOSE 98 08/25/2012   CHOL 201* 08/25/2012   TRIG 382.0* 08/25/2012   HDL 36.90* 08/25/2012   LDLDIRECT 88.1 08/25/2012   LDLCALC 58 01/01/2008   ALT 6 08/25/2012   AST 26 08/25/2012   NA 139 08/25/2012   K 3.9 08/25/2012   CL 102 08/25/2012   CREATININE 0.8 08/25/2012   BUN 14 08/25/2012   CO2 33* 08/25/2012   TSH 1.31 08/25/2012   PSA 0.03* 03/04/2010   INR 2.3 12/01/2012   HGBA1C 5.3 03/04/2010

## 2013-02-27 NOTE — Assessment & Plan Note (Signed)
With gradual worsening it seems, no overt symtpoms needing symptomatic control, cont same meds,  to f/u any worsening symptoms or concerns

## 2013-02-27 NOTE — Assessment & Plan Note (Signed)
stable overall by history and exam, recent data reviewed with pt, and pt to continue medical treatment as before,  to f/u any worsening symptoms or concerns BP Readings from Last 3 Encounters:  02/27/13 120/80  12/18/12 130/70  12/01/12 134/70

## 2013-02-27 NOTE — Progress Notes (Signed)
Subjective:    Patient ID: [name] [name], male    DOB: 1934-11-28, 78 y.o.   MRN: [id number]  HPI  Here to f/u; overall doing ok,  Pt denies chest pain, increased sob or doe, wheezing, orthopnea, PND, increased LE swelling, palpitations, dizziness or syncope.  Pt denies polydipsia, polyuria, or low sugar symptoms such as weakness or confusion improved with po intake.  Pt denies new neurological symptoms such as new headache, or facial or extremity weakness or numbness.   Pt states overall good compliance with meds, has been trying to follow lower cholesterol, diabetic diet, with wt overall stable,  but little exercise however.  Denies worsening depressive symptoms, suicidal ideation, or panic; Dementia overall worsened symptomatically in last 6 mo, and not assoc with behavioral changes such as hallucinations, paranoia, or agitation, but sleeping much more recently, chronic incontinent, originally dx 2009.  Needs clonazepam refill. Did have an assisted fall last night without injury, wife remains very supportive Past Medical History  Diagnosis Date  . ONYCHOMYCOSIS, TOENAILS 06/27/2008  . HYPERLIPIDEMIA 07/28/2007  . ANXIETY 09/28/2007  . OBSTRUCTIVE SLEEP APNEA 01/01/2008  . RESTLESS LEGS SYNDROME 12/26/2007  . HEARING LOSS 06/27/2008  . HYPERTENSION 07/28/2007  . MYOCARDIAL INFARCTION, HX OF 07/28/2007  . CORONARY ARTERY DISEASE 07/28/2007  . Atrial fibrillation 09/28/2007  . SINUSITIS- ACUTE-NOS 10/10/2007  . ALLERGIC RHINITIS 09/28/2007  . CHOLELITHIASIS 09/28/2007  . BENIGN PROSTATIC HYPERTROPHY 09/28/2007  . CELLULITIS, HAND, LEFT 09/24/2010  . SYNCOPE 07/28/2007  . INSOMNIA-SLEEP DISORDER-UNSPEC 04/03/2009  . HYPERSOMNIA WITH SLEEP APNEA UNSPECIFIED 12/26/2007  . FATIGUE 01/01/2008  . DYSPNEA 04/03/2009  . Wheezing 11/06/2009  . ANGIOEDEMA 11/06/2009  . COLON CANCER, HX OF 07/28/2007  . PROSTATE CANCER, HX OF 07/28/2007  . COLONIC POLYPS, HX OF 07/28/2007  . Long term current use of anticoagulant  12/22/2010  . Asthma   . DEMENTIA   . Spinal stenosis   . History of atrial fibrillation   . Lumbar stenosis 10/30/2011  . CAD (coronary artery disease)     Severe three-vessel coronary disease. LIMA to the LAD patent. SVG to PDA patent. SVG to OM 3 patent. Preserved ejection fraction. 2008.  . Cancer   . Prostate cancer   . Colon cancer   . Thyroid disease   . Arthritis   . Pneumonia   . 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 Prostatectomy  2001  . Cardioversion  2007  . Stress cardiolite  02/08/2007  . Schocardiograph  10/07/2006  . Right thyroid lobectomy    . Coronary artery bypass graft  1995    x 3  . Hernia repair  1998  . Colonoscopy  2010    last done 2010- clear  . Cardiac electrophysiology study and ablation    . Thyroidectomy, partial      reports that he has never smoked. He does not have any smokeless tobacco history on file. He reports that he does not drink alcohol or use illicit drugs. family history includes Coronary artery disease in his father and sister and Hypertension in his other. Allergies  Allergen Reactions  . Lisinopril Swelling   Current Outpatient Prescriptions on File Prior to Visit  Medication Sig Dispense Refill  . albuterol (PROAIR HFA) 108 (90 BASE) MCG/ACT inhaler Inhale 2 puffs into the lungs every 4 (four) hours as needed. For wheezing  3 Inhaler  3  . ALPRAZolam (XANAX) 0.5 MG tablet Take  1 tablet (0.5 mg total) by mouth 3 (three) times daily as needed.  90 tablet  1  . amLODipine (NORVASC) 5 MG tablet TAKE 1 TABLET (5 MG TOTAL) DAILY  90 tablet  3  . apixaban (ELIQUIS) 5 MG TABS tablet Take 1 tablet (5 mg total) by mouth 2 (two) times daily.  180 tablet  3  . atenolol (TENORMIN) 50 MG tablet Take 1 tablet (50 mg total) by mouth at bedtime.  90 tablet  3  . buPROPion (WELLBUTRIN XL) 150 MG 24 hr tablet Take 1 tablet (150 mg total) by mouth at bedtime.  90  tablet  3  . carbidopa-levodopa (SINEMET CR) 25-100 MG per tablet Take 3 tablets by mouth. Before meals      . Cholecalciferol (VITAMIN D3) 1000 UNITS CAPS Take 1 capsule by mouth every morning.       . donepezil (ARICEPT) 23 MG TABS tablet Take 23 mg by mouth at bedtime.      Marland Kitchen FLUoxetine (PROZAC) 40 MG capsule Take 1 capsule (40 mg total) by mouth every morning.  90 capsule  3  . fluticasone (VERAMYST) 27.5 MCG/SPRAY nasal spray Place 1 spray into the nose 2 (two) times daily.      Marland Kitchen guaifenesin (HUMIBID E) 400 MG TABS Take 400 mg by mouth every 4 (four) hours as needed. For chest congestion      . L-Methylfolate-B12-B6-B2 (CEREFOLIN) 04-08-49-5 MG TABS TAKE 1 TABLET DAILY  90 each  3  . levocetirizine (XYZAL) 5 MG tablet Take 1 tablet (5 mg total) by mouth every evening.  90 tablet  3  . Multiple Vitamin (MULTIVITAMINS PO) Take 1 tablet by mouth at bedtime.       . primidone (MYSOLINE) 50 MG tablet Take 50 mg by mouth 2 (two) times daily.       . solifenacin (VESICARE) 5 MG tablet Take 1 tablet (5 mg total) by mouth every morning.  90 tablet  3   No current facility-administered medications on file prior to visit.   Review of Systems  Constitutional: Negative for unexpected weight change, or unusual diaphoresis  HENT: Negative for tinnitus.   Eyes: Negative for photophobia and visual disturbance.  Respiratory: Negative for choking and stridor.   Gastrointestinal: Negative for vomiting and blood in stool.  Genitourinary: Negative for hematuria and decreased urine volume.  Musculoskeletal: Negative for acute joint swelling Skin: Negative for color change and wound.  Neurological: Negative for tremors and numbness other than noted     Objective:   Physical Exam BP 120/80  Pulse 60  Temp(Src) 98.5 F (36.9 C) (Oral)  Ht 5\' 9"  (1.753 m)  Wt 224 lb (101.606 kg)  BMI 33.06 kg/m2  SpO2 93% VS noted, not ill appearing, mostly well kempt Constitutional: Pt appears well-developed and  well-nourished./obese  HENT: Head: NCAT.  Right Ear: External ear normal.  Left Ear: External ear normal.  Eyes: Conjunctivae and EOM are normal. Pupils are equal, round, and reactive to light.  Neck: Normal range of motion. Neck supple.  Cardiovascular: Normal rate and regular rhythm.   Pulmonary/Chest: Effort normal and breath sounds normal.  Abd:  Soft, NT, non-distended, + BS Neurological: Pt is alert. Seems at baseline confused , has seated walker with him Skin: Skin is warm. No erythema. No Le edema Psychiatric: Pt behavior is normal. Thought content normal.     Assessment & Plan:

## 2013-02-27 NOTE — Patient Instructions (Signed)
Please continue all other medications as before, and refills have been done if requested - the klonopin No blood work needed today Please continue your efforts at being more active, low cholesterol diet, and weight control, as you can Please return in 6 months, or sooner if needed

## 2013-02-28 DIAGNOSIS — M999 Biomechanical lesion, unspecified: Secondary | ICD-10-CM | POA: Diagnosis not present

## 2013-02-28 DIAGNOSIS — M5137 Other intervertebral disc degeneration, lumbosacral region: Secondary | ICD-10-CM | POA: Diagnosis not present

## 2013-03-05 DIAGNOSIS — M999 Biomechanical lesion, unspecified: Secondary | ICD-10-CM | POA: Diagnosis not present

## 2013-03-05 DIAGNOSIS — M5137 Other intervertebral disc degeneration, lumbosacral region: Secondary | ICD-10-CM | POA: Diagnosis not present

## 2013-03-20 DIAGNOSIS — M999 Biomechanical lesion, unspecified: Secondary | ICD-10-CM | POA: Diagnosis not present

## 2013-03-20 DIAGNOSIS — M5137 Other intervertebral disc degeneration, lumbosacral region: Secondary | ICD-10-CM | POA: Diagnosis not present

## 2013-03-22 DIAGNOSIS — H2510 Age-related nuclear cataract, unspecified eye: Secondary | ICD-10-CM | POA: Diagnosis not present

## 2013-03-27 ENCOUNTER — Other Ambulatory Visit: Payer: Self-pay | Admitting: Neurology

## 2013-03-27 DIAGNOSIS — M999 Biomechanical lesion, unspecified: Secondary | ICD-10-CM | POA: Diagnosis not present

## 2013-03-27 DIAGNOSIS — M5137 Other intervertebral disc degeneration, lumbosacral region: Secondary | ICD-10-CM | POA: Diagnosis not present

## 2013-03-28 DIAGNOSIS — H2510 Age-related nuclear cataract, unspecified eye: Secondary | ICD-10-CM | POA: Diagnosis not present

## 2013-03-28 DIAGNOSIS — H269 Unspecified cataract: Secondary | ICD-10-CM | POA: Diagnosis not present

## 2013-04-04 DIAGNOSIS — M999 Biomechanical lesion, unspecified: Secondary | ICD-10-CM | POA: Diagnosis not present

## 2013-04-04 DIAGNOSIS — M5137 Other intervertebral disc degeneration, lumbosacral region: Secondary | ICD-10-CM | POA: Diagnosis not present

## 2013-04-12 DIAGNOSIS — H2510 Age-related nuclear cataract, unspecified eye: Secondary | ICD-10-CM | POA: Diagnosis not present

## 2013-04-25 DIAGNOSIS — M999 Biomechanical lesion, unspecified: Secondary | ICD-10-CM | POA: Diagnosis not present

## 2013-04-25 DIAGNOSIS — M5137 Other intervertebral disc degeneration, lumbosacral region: Secondary | ICD-10-CM | POA: Diagnosis not present

## 2013-04-30 DIAGNOSIS — H269 Unspecified cataract: Secondary | ICD-10-CM | POA: Diagnosis not present

## 2013-04-30 DIAGNOSIS — H2510 Age-related nuclear cataract, unspecified eye: Secondary | ICD-10-CM | POA: Diagnosis not present

## 2013-05-02 DIAGNOSIS — M999 Biomechanical lesion, unspecified: Secondary | ICD-10-CM | POA: Diagnosis not present

## 2013-05-02 DIAGNOSIS — M5137 Other intervertebral disc degeneration, lumbosacral region: Secondary | ICD-10-CM | POA: Diagnosis not present

## 2013-05-14 DIAGNOSIS — M5137 Other intervertebral disc degeneration, lumbosacral region: Secondary | ICD-10-CM | POA: Diagnosis not present

## 2013-05-14 DIAGNOSIS — M999 Biomechanical lesion, unspecified: Secondary | ICD-10-CM | POA: Diagnosis not present

## 2013-05-15 ENCOUNTER — Emergency Department (HOSPITAL_COMMUNITY): Payer: Medicare Other

## 2013-05-15 ENCOUNTER — Emergency Department (HOSPITAL_COMMUNITY)
Admission: EM | Admit: 2013-05-15 | Discharge: 2013-05-15 | Disposition: A | Discharge status: Home / Self Care | Payer: Medicare Other | Attending: Emergency Medicine | Admitting: Emergency Medicine

## 2013-05-15 ENCOUNTER — Encounter (HOSPITAL_COMMUNITY): Payer: Self-pay

## 2013-05-15 DIAGNOSIS — Z85038 Personal history of other malignant neoplasm of large intestine: Secondary | ICD-10-CM | POA: Diagnosis not present

## 2013-05-15 DIAGNOSIS — G20 Parkinson's disease: Secondary | ICD-10-CM | POA: Insufficient documentation

## 2013-05-15 DIAGNOSIS — Z79899 Other long term (current) drug therapy: Secondary | ICD-10-CM | POA: Insufficient documentation

## 2013-05-15 DIAGNOSIS — Z8701 Personal history of pneumonia (recurrent): Secondary | ICD-10-CM | POA: Insufficient documentation

## 2013-05-15 DIAGNOSIS — J45909 Unspecified asthma, uncomplicated: Secondary | ICD-10-CM | POA: Diagnosis not present

## 2013-05-15 DIAGNOSIS — Z872 Personal history of diseases of the skin and subcutaneous tissue: Secondary | ICD-10-CM | POA: Insufficient documentation

## 2013-05-15 DIAGNOSIS — F05 Delirium due to known physiological condition: Secondary | ICD-10-CM | POA: Diagnosis not present

## 2013-05-15 DIAGNOSIS — E785 Hyperlipidemia, unspecified: Secondary | ICD-10-CM | POA: Insufficient documentation

## 2013-05-15 DIAGNOSIS — I252 Old myocardial infarction: Secondary | ICD-10-CM | POA: Insufficient documentation

## 2013-05-15 DIAGNOSIS — Z8709 Personal history of other diseases of the respiratory system: Secondary | ICD-10-CM | POA: Insufficient documentation

## 2013-05-15 DIAGNOSIS — Z8669 Personal history of other diseases of the nervous system and sense organs: Secondary | ICD-10-CM | POA: Insufficient documentation

## 2013-05-15 DIAGNOSIS — F29 Unspecified psychosis not due to a substance or known physiological condition: Secondary | ICD-10-CM | POA: Diagnosis not present

## 2013-05-15 DIAGNOSIS — Y9389 Activity, other specified: Secondary | ICD-10-CM | POA: Insufficient documentation

## 2013-05-15 DIAGNOSIS — Z8679 Personal history of other diseases of the circulatory system: Secondary | ICD-10-CM | POA: Diagnosis not present

## 2013-05-15 DIAGNOSIS — Z87448 Personal history of other diseases of urinary system: Secondary | ICD-10-CM | POA: Insufficient documentation

## 2013-05-15 DIAGNOSIS — Z8739 Personal history of other diseases of the musculoskeletal system and connective tissue: Secondary | ICD-10-CM | POA: Diagnosis not present

## 2013-05-15 DIAGNOSIS — S298XXA Other specified injuries of thorax, initial encounter: Secondary | ICD-10-CM | POA: Diagnosis not present

## 2013-05-15 DIAGNOSIS — I10 Essential (primary) hypertension: Secondary | ICD-10-CM | POA: Insufficient documentation

## 2013-05-15 DIAGNOSIS — R110 Nausea: Secondary | ICD-10-CM | POA: Insufficient documentation

## 2013-05-15 DIAGNOSIS — E079 Disorder of thyroid, unspecified: Secondary | ICD-10-CM | POA: Insufficient documentation

## 2013-05-15 DIAGNOSIS — Z8546 Personal history of malignant neoplasm of prostate: Secondary | ICD-10-CM | POA: Insufficient documentation

## 2013-05-15 DIAGNOSIS — I2510 Atherosclerotic heart disease of native coronary artery without angina pectoris: Secondary | ICD-10-CM | POA: Insufficient documentation

## 2013-05-15 DIAGNOSIS — Z860100 Personal history of colon polyps, unspecified: Secondary | ICD-10-CM | POA: Insufficient documentation

## 2013-05-15 DIAGNOSIS — W102XXA Fall (on)(from) incline, initial encounter: Secondary | ICD-10-CM

## 2013-05-15 DIAGNOSIS — G20A1 Parkinson's disease without dyskinesia, without mention of fluctuations: Secondary | ICD-10-CM | POA: Insufficient documentation

## 2013-05-15 DIAGNOSIS — Z8619 Personal history of other infectious and parasitic diseases: Secondary | ICD-10-CM | POA: Diagnosis not present

## 2013-05-15 DIAGNOSIS — R410 Disorientation, unspecified: Secondary | ICD-10-CM | POA: Diagnosis present

## 2013-05-15 DIAGNOSIS — S0990XA Unspecified injury of head, initial encounter: Secondary | ICD-10-CM | POA: Diagnosis not present

## 2013-05-15 DIAGNOSIS — Y929 Unspecified place or not applicable: Secondary | ICD-10-CM | POA: Insufficient documentation

## 2013-05-15 DIAGNOSIS — F068 Other specified mental disorders due to known physiological condition: Secondary | ICD-10-CM | POA: Insufficient documentation

## 2013-05-15 DIAGNOSIS — T1490XA Injury, unspecified, initial encounter: Secondary | ICD-10-CM | POA: Diagnosis not present

## 2013-05-15 LAB — URINALYSIS, ROUTINE W REFLEX MICROSCOPIC
Ketones, ur: NEGATIVE mg/dL
Leukocytes, UA: NEGATIVE
Nitrite: NEGATIVE
Specific Gravity, Urine: 1.02 (ref 1.005–1.030)
pH: 7.5 (ref 5.0–8.0)

## 2013-05-15 LAB — CBC WITH DIFFERENTIAL/PLATELET
Basophils Absolute: 0 10*3/uL (ref 0.0–0.1)
Lymphocytes Relative: 16 % (ref 12–46)
Neutro Abs: 4.7 10*3/uL (ref 1.7–7.7)
Neutrophils Relative %: 72 % (ref 43–77)
Platelets: 169 10*3/uL (ref 150–400)
RDW: 13.8 % (ref 11.5–15.5)
WBC: 6.5 10*3/uL (ref 4.0–10.5)

## 2013-05-15 LAB — BASIC METABOLIC PANEL
Calcium: 8.7 mg/dL (ref 8.4–10.5)
Creatinine, Ser: 0.75 mg/dL (ref 0.50–1.35)
GFR calc Af Amer: >90 mL/min (ref >90)

## 2013-05-15 LAB — URINE MICROSCOPIC-ADD ON

## 2013-05-15 NOTE — ED Notes (Signed)
Patient transported to CT 

## 2013-05-15 NOTE — ED Notes (Signed)
Family at bedside. 

## 2013-05-15 NOTE — ED Provider Notes (Signed)
History    CSN: [id number] Arrival date & time 05/15/13  1335  First MD Initiated Contact with Patient 05/15/13 1339     Chief Complaint  Patient presents with  . Fall   (Consider location/radiation/quality/duration/timing/severity/associated sxs/prior Treatment) HPI [name] [name] 78 y.o. with multiple medical problems listed below including Alzheimer's and Parkinson's presents emergency department for fall. Patient's wife is also concerned about worsening confusion. Patient had fallen as he is trying to get into a car this afternoon. There was no prodromal symptoms prior to the fall including lightheadedness, nausea, syncope, or shortness of breath. Patient states that he lost his footing. At no time did the patient lose consciousness. Patient has no amnesia either. He fell backwards hitting his head on the concrete. No loss per the patient. Slight headache. Headache is posterior, aching in character, to 10, nonradiating, no known exacerbating or relieving factors, and denies any associated visual changes. He also denies any focal weakness or numbness. The patient is on Eliquis for atrial fibrillation. Patient wife has noted increasing confusion over the past 4-5 days as well. She believes it slightly worsening. Denies cough, fever, chills, sweats, diaphoresis, nausea, vomiting, abdominal pain, dysuria, or lower extremity edema. Past Medical History  Diagnosis Date  . ONYCHOMYCOSIS, TOENAILS 06/27/2008  . HYPERLIPIDEMIA 07/28/2007  . ANXIETY 09/28/2007  . OBSTRUCTIVE SLEEP APNEA 01/01/2008  . RESTLESS LEGS SYNDROME 12/26/2007  . HEARING LOSS 06/27/2008  . HYPERTENSION 07/28/2007  . MYOCARDIAL INFARCTION, HX OF 07/28/2007  . CORONARY ARTERY DISEASE 07/28/2007  . Atrial fibrillation 09/28/2007  . SINUSITIS- ACUTE-NOS 10/10/2007  . ALLERGIC RHINITIS 09/28/2007  . CHOLELITHIASIS 09/28/2007  . BENIGN PROSTATIC HYPERTROPHY 09/28/2007  . CELLULITIS, HAND, LEFT 09/24/2010  . SYNCOPE 07/28/2007  .  INSOMNIA-SLEEP DISORDER-UNSPEC 04/03/2009  . HYPERSOMNIA WITH SLEEP APNEA UNSPECIFIED 12/26/2007  . FATIGUE 01/01/2008  . DYSPNEA 04/03/2009  . Wheezing 11/06/2009  . ANGIOEDEMA 11/06/2009  . COLON CANCER, HX OF 07/28/2007  . PROSTATE CANCER, HX OF 07/28/2007  . COLONIC POLYPS, HX OF 07/28/2007  . Long term current use of anticoagulant 12/22/2010  . Asthma   . DEMENTIA   . Spinal stenosis   . History of atrial fibrillation   . Lumbar stenosis 10/30/2011  . CAD (coronary artery disease)     Severe three-vessel coronary disease. LIMA to the LAD patent. SVG to PDA patent. SVG to OM 3 patent. Preserved ejection fraction. 2008.  . Cancer   . Prostate cancer   . Colon cancer   . Thyroid disease   . Arthritis   . Pneumonia   . 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 Prostatectomy  2001  . Cardioversion  2007  . Stress cardiolite  02/08/2007  . Schocardiograph  10/07/2006  . Right thyroid lobectomy    . Coronary artery bypass graft  1995    x 3  . Hernia repair  1998  . Colonoscopy  2010    last done 2010- clear  . Cardiac electrophysiology study and ablation    . Thyroidectomy, partial     Family History  Problem Relation Age of Onset  . Coronary artery disease Father     before 57 yo  . Coronary artery disease Sister     before 6 yo  . Hypertension Other    History  Substance Use Topics  . Smoking status: Never Smoker   . Smokeless tobacco: Not on file  . Alcohol Use:  No    Review of Systems  Constitutional: Negative for fever, chills, diaphoresis, activity change, appetite change and fatigue.  HENT: Negative for congestion, sore throat, rhinorrhea, sneezing and trouble swallowing.   Eyes: Negative for pain and redness.  Respiratory: Positive for chest tightness. Negative for cough, choking, shortness of breath, wheezing and stridor.   Cardiovascular: Positive for chest pain. Negative for leg  swelling.  Gastrointestinal: Positive for nausea. Negative for vomiting, diarrhea, constipation, blood in stool, abdominal distention and anal bleeding.  Musculoskeletal: Negative for myalgias and back pain.  Skin: Negative for rash.  Neurological: Positive for headaches. Negative for dizziness, speech difficulty, weakness, light-headedness and numbness.  Hematological: Negative for adenopathy.  Psychiatric/Behavioral: Positive for confusion.    Allergies  Lisinopril  Home Medications   Current Outpatient Rx  Name  Route  Sig  Dispense  Refill  . albuterol (PROAIR HFA) 108 (90 BASE) MCG/ACT inhaler   Inhalation   Inhale 2 puffs into the lungs every 4 (four) hours as needed. For wheezing   3 Inhaler   3   . ALPRAZolam (XANAX) 0.5 MG tablet   Oral   Take 1 tablet (0.5 mg total) by mouth 3 (three) times daily as needed.   90 tablet   1   . amLODipine (NORVASC) 5 MG tablet      TAKE 1 TABLET (5 MG TOTAL) DAILY   90 tablet   3   . apixaban (ELIQUIS) 5 MG TABS tablet   Oral   Take 1 tablet (5 mg total) by mouth 2 (two) times daily.   180 tablet   3   . atenolol (TENORMIN) 50 MG tablet   Oral   Take 1 tablet (50 mg total) by mouth at bedtime.   90 tablet   3   . buPROPion (WELLBUTRIN XL) 150 MG 24 hr tablet   Oral   Take 1 tablet (150 mg total) by mouth at bedtime.   90 tablet   3   . carbidopa-levodopa (SINEMET CR) 25-100 MG per tablet   Oral   Take 3 tablets by mouth daily. Before meals         . Cholecalciferol (VITAMIN D3) 1000 UNITS CAPS   Oral   Take 1 capsule by mouth every morning.          . clonazePAM (KLONOPIN) 1 MG tablet   Oral   Take 1 tablet (1 mg total) by mouth at bedtime as needed for anxiety.   30 tablet   5   . clonazePAM (KLONOPIN) 1 MG tablet   Oral   Take 1 mg by mouth at bedtime.         . Coenzyme Q10 (CO Q 10 PO)   Oral   Take 1 tablet by mouth at bedtime.         Marland Kitchen desloratadine (CLARINEX) 5 MG tablet   Oral    Take 5 mg by mouth daily.         Marland Kitchen docusate sodium (COLACE) 100 MG capsule   Oral   Take 200 mg by mouth at bedtime.         . donepezil (ARICEPT) 23 MG TABS tablet   Oral   Take 23 mg by mouth at bedtime.         Marland Kitchen FLUoxetine (PROZAC) 40 MG capsule   Oral   Take 1 capsule (40 mg total) by mouth every morning.   90 capsule   3   . fluticasone (VERAMYST) 27.5  MCG/SPRAY nasal spray   Nasal   Place 1 spray into the nose 2 (two) times daily.         . Fluticasone-Salmeterol (ADVAIR) 250-50 MCG/DOSE AEPB   Inhalation   Inhale 1 puff into the lungs every 12 (twelve) hours.         Marland Kitchen guaifenesin (HUMIBID E) 400 MG TABS   Oral   Take 400 mg by mouth every 4 (four) hours as needed. For chest congestion         . ibuprofen (ADVIL,MOTRIN) 200 MG tablet   Oral   Take 400 mg by mouth every 6 (six) hours as needed for pain.         Marland Kitchen L-Methylfolate-B12-B6-B2 (CEREFOLIN) 04-08-49-5 MG TABS      TAKE 1 TABLET DAILY   90 each   3   . levocetirizine (XYZAL) 5 MG tablet   Oral   Take 1 tablet (5 mg total) by mouth every evening.   90 tablet   3   . Multiple Vitamin (MULTIVITAMINS PO)   Oral   Take 1 tablet by mouth at bedtime.          . primidone (MYSOLINE) 50 MG tablet      TAKE 1 TABLET (50MG ) 2     TIMES A DAY AS DIRECTED   180 tablet   3   . solifenacin (VESICARE) 5 MG tablet   Oral   Take 10 mg by mouth daily.          BP 124/67  Pulse 55  Temp(Src) 99.4 F (37.4 C) (Oral)  Resp 11  Ht 5\' 9"  (1.753 m)  Wt 225 lb (102.059 kg)  BMI 33.21 kg/m2  SpO2 95% Physical Exam  Nursing note and vitals reviewed. Constitutional: He is oriented to person, place, and time. He appears well-developed and well-nourished. No distress.  HENT:  Head: Normocephalic and atraumatic.  Eyes: Conjunctivae and EOM are normal. Pupils are equal, round, and reactive to light. Right eye exhibits no discharge. Left eye exhibits no discharge.  Neck: Normal range of motion.  Neck supple. No JVD present. No tracheal deviation present.  Cardiovascular: Normal rate, regular rhythm and normal heart sounds.  Exam reveals no friction rub.   No murmur heard. Pulmonary/Chest: Effort normal and breath sounds normal. No stridor. No respiratory distress. He has no wheezes. He has no rales. He exhibits no tenderness.  Abdominal: Soft. He exhibits no distension. There is no tenderness. There is no rebound and no guarding.  Musculoskeletal: Normal range of motion.  Free range of motion in the bilateral upper and lower extremities Strength 5 out of 5 and symmetric bilaterally Hip flexor strength 5 out of 5 and symmetric bilaterally Dorsiflexion strength 5 out of 5 and symmetric bilaterally No clonus bilaterally  Neurological: He is alert and oriented to person, place, and time. No cranial nerve deficit (Cranial nerves III through XII intact grossly bilaterally).  Skin: Skin is warm.  Psychiatric: He has a normal mood and affect.    ED Course  Procedures (including critical care time) Labs Reviewed  BASIC METABOLIC PANEL - Abnormal; Notable for the following:    GFR calc non Af Amer 87 (*)    All other components within normal limits  CBC WITH DIFFERENTIAL  URINALYSIS, ROUTINE W REFLEX MICROSCOPIC   Dg Chest 2 View  05/15/2013   *RADIOLOGY REPORT*  Clinical Data: Fall.  CHEST - 2 VIEW  Comparison: 12/18/2012  Findings: Previous median sternotomy and CABG procedure.  There is no pleural effusion or edema.  There is no airspace consolidation identified.  The visualized osseous structures are remarkable for thoracic spondylosis.  IMPRESSION:  1.  No acute cardiopulmonary abnormalities.   Original Report Authenticated By: Signa Kell, M.D.   No diagnosis found. Results for orders placed during the hospital encounter of 05/15/13  BASIC METABOLIC PANEL      Result Value Range   Sodium 139  135 - 145 mEq/L   Potassium 4.1  3.5 - 5.1 mEq/L   Chloride 104  96 - 112 mEq/L   CO2  30  19 - 32 mEq/L   Glucose, Bld 94  70 - 99 mg/dL   BUN 16  6 - 23 mg/dL   Creatinine, Ser 1.61  0.50 - 1.35 mg/dL   Calcium 8.7  8.4 - 09.6 mg/dL   GFR calc non Af Amer 87 (*) >90 mL/min   GFR calc Af Amer >90  >90 mL/min  CBC WITH DIFFERENTIAL      Result Value Range   WBC 6.5  4.0 - 10.5 K/uL   RBC 4.36  4.22 - 5.81 MIL/uL   Hemoglobin 13.9  13.0 - 17.0 g/dL   HCT 04.5  40.9 - 81.1 %   MCV 92.4  78.0 - 100.0 fL   MCH 31.9  26.0 - 34.0 pg   MCHC 34.5  30.0 - 36.0 g/dL   RDW 91.4  78.2 - 95.6 %   Platelets 169  150 - 400 K/uL   Neutrophils Relative % 72  43 - 77 %   Neutro Abs 4.7  1.7 - 7.7 K/uL   Lymphocytes Relative 16  12 - 46 %   Lymphs Abs 1.1  0.7 - 4.0 K/uL   Monocytes Relative 9  3 - 12 %   Monocytes Absolute 0.6  0.1 - 1.0 K/uL   Eosinophils Relative 3  0 - 5 %   Eosinophils Absolute 0.2  0.0 - 0.7 K/uL   Basophils Relative 1  0 - 1 %   Basophils Absolute 0.0  0.0 - 0.1 K/uL    MDM  Estevan Ryder 78 y.o. with multiple medical problems as outlined above presents after a fall. Patient's wife is also noted the patient to be more confused than his baseline with Alzheimer's-type dementia. No syncope, loss of consciousness, or amnesia related to the fall. No focal deficits on exam. Patient moving all 4 extremities freely. Strength in upper extremities is intact bilaterally. strength in the lower extremities is intact bilaterally. He's afebrile vital signs are stable. CT head indicated as patient fell and struck his head. No acute intracranial abnormality noted on CT scan. Workup for altered mental status indicated as well. White blood cell count is within normal limits. Electrolytes within normal limits. Renal function at baseline. Hemoglobin stable. Chest x-ray negative for pneumonia. UA pending.  I discussed this patient's care at my attending, Dr. Freida Busman.  Sena Hitch, MD 05/16/13 [phone]

## 2013-05-15 NOTE — ED Notes (Signed)
Tripped backwards when getting in car to go to dentist. Pt with hx of parkinsons and frequent falls. No bruising noted per EMS, pt does take blood thinner equios.

## 2013-05-15 NOTE — ED Provider Notes (Signed)
I saw and evaluated the patient, reviewed the resident's note and I agree with the findings and plan.  Subjective: Patient here after having leg weakness while getting into his car and striking his head. No loss of consciousness.  Objective: Patient without signs of active bleeding on the scalp.  Assessment/plan: Patient had head CT as well as labs due 2 increased weakness x4 days  Toy Baker, MD 05/15/13 1407

## 2013-05-15 NOTE — ED Provider Notes (Signed)
Pt care assumed from Dr Malen Gauze, see his note for complete H&P. Briefly pt w/ Alzheimer dx, fell getting out of car, hit head on concrete, now confused, CT head - NAICA, exam unremarkable, CXR - NACPF, labs unremarkable, u/a neg for infection, ECG w/out acute ischemia or interval change. Sinus brady - c/w prior. Pt able to ambulate and per family is at baseline. Stable for d/c home. Recommend close fup w/ pcp tomorrow. At this time doubt acute infectious etiology. Likely progression of alzheimer disease. D/c in stable condition. Given return precautions.      1. Fall (on)(from) incline, initial encounter   2. Confusion    Corwin Levins, MD [address] [place] [address] [phone]   Follow up with your regular doctor if the falls continue     Date: 05/15/2013  Rate: 51  Rhythm: sinus bradycardia  QRS Axis: left  Intervals: normal  ST/T Wave abnormalities: indeterminate  Conduction Disutrbances:none  Narrative Interpretation:   Old EKG Reviewed: unchanged  Results for orders placed during the hospital encounter of 05/15/13  BASIC METABOLIC PANEL      Result Value Range   Sodium 139  135 - 145 mEq/L   Potassium 4.1  3.5 - 5.1 mEq/L   Chloride 104  96 - 112 mEq/L   CO2 30  19 - 32 mEq/L   Glucose, Bld 94  70 - 99 mg/dL   BUN 16  6 - 23 mg/dL   Creatinine, Ser 9.14  0.50 - 1.35 mg/dL   Calcium 8.7  8.4 - 78.2 mg/dL   GFR calc non Af Amer 87 (*) >90 mL/min   GFR calc Af Amer >90  >90 mL/min  CBC WITH DIFFERENTIAL      Result Value Range   WBC 6.5  4.0 - 10.5 K/uL   RBC 4.36  4.22 - 5.81 MIL/uL   Hemoglobin 13.9  13.0 - 17.0 g/dL   HCT 95.6  21.3 - 08.6 %   MCV 92.4  78.0 - 100.0 fL   MCH 31.9  26.0 - 34.0 pg   MCHC 34.5  30.0 - 36.0 g/dL   RDW 57.8  46.9 - 62.9 %   Platelets 169  150 - 400 K/uL   Neutrophils Relative % 72  43 - 77 %   Neutro Abs 4.7  1.7 - 7.7 K/uL   Lymphocytes Relative 16  12 - 46 %   Lymphs Abs 1.1  0.7 - 4.0 K/uL   Monocytes Relative  9  3 - 12 %   Monocytes Absolute 0.6  0.1 - 1.0 K/uL   Eosinophils Relative 3  0 - 5 %   Eosinophils Absolute 0.2  0.0 - 0.7 K/uL   Basophils Relative 1  0 - 1 %   Basophils Absolute 0.0  0.0 - 0.1 K/uL  URINALYSIS, ROUTINE W REFLEX MICROSCOPIC      Result Value Range   Color, Urine YELLOW  YELLOW   APPearance CLEAR  CLEAR   Specific Gravity, Urine 1.020  1.005 - 1.030   pH 7.5  5.0 - 8.0   Glucose, UA NEGATIVE  NEGATIVE mg/dL   Hgb urine dipstick LARGE (*) NEGATIVE   Bilirubin Urine NEGATIVE  NEGATIVE   Ketones, ur NEGATIVE  NEGATIVE mg/dL   Protein, ur NEGATIVE  NEGATIVE mg/dL   Urobilinogen, UA 1.0  0.0 - 1.0 mg/dL   Nitrite NEGATIVE  NEGATIVE   Leukocytes, UA NEGATIVE  NEGATIVE  URINE MICROSCOPIC-ADD ON  Result Value Range   Squamous Epithelial / LPF RARE  RARE   WBC, UA 0-2  <3 WBC/hpf   RBC / HPF TOO NUMEROUS TO COUNT  <3 RBC/hpf   Bacteria, UA FEW (*) RARE   Casts HYALINE CASTS (*) NEGATIVE    CT Head Wo Contrast (Final result)  Result time: 05/15/13 16:04:10    Final result by Rad Results In Interface (05/15/13 16:04:10)    Narrative:   *RADIOLOGY REPORT*  Clinical Data: Fall  CT HEAD WITHOUT CONTRAST  Technique: Continous axial images were obtained from the base of the skull to the vertex without IV contrast.  Comparison: CT scan 12/13/11  Findings: No skull fracture is noted. No intracranial hemorrhage, mass effect or midline shift. No acute infarction. No mass lesion is noted on this unenhanced scan. The gray and white matter differentiation is preserved. Paranasal sinuses and mastoid air cells are unremarkable. Stable cerebral atrophy. Stable periventricular and patchy subcortical chronic white matter disease.  IMPRESSION: No acute intracranial abnormality. Stable atrophy and chronic white matter disease.   Original Report Authenticated By: Natasha Mead, M.D.             DG Chest 2 View (Final result)  Result time: 05/15/13 15:17:50     Final result by Rad Results In Interface (05/15/13 15:17:50)    Narrative:   *RADIOLOGY REPORT*  Clinical Data: Fall.  CHEST - 2 VIEW  Comparison: 12/18/2012  Findings: Previous median sternotomy and CABG procedure. There is no pleural effusion or edema. There is no airspace consolidation identified. The visualized osseous structures are remarkable for thoracic spondylosis.  IMPRESSION:  1. No acute cardiopulmonary abnormalities.   Original Report Authenticated By: Signa Kell, M.D.          Audelia Hives, MD 05/16/13 [phone]

## 2013-05-16 NOTE — ED Provider Notes (Signed)
I saw and evaluated the patient, reviewed the resident's note and I agree with the findings and plan.  Milton Sagona T Eliaz Fout, MD 05/16/13 0744 

## 2013-05-18 ENCOUNTER — Telehealth: Payer: Self-pay | Admitting: Neurology

## 2013-05-20 NOTE — ED Provider Notes (Signed)
I saw and evaluated the patient, reviewed the resident's note and I agree with the findings and plan.  Toy Baker, MD 05/20/13 [phone]

## 2013-05-21 ENCOUNTER — Telehealth: Payer: Self-pay | Admitting: Neurology

## 2013-05-21 NOTE — Telephone Encounter (Signed)
Called the patient lvm asking to call us back regarding his frequent falls.

## 2013-05-22 DIAGNOSIS — M999 Biomechanical lesion, unspecified: Secondary | ICD-10-CM | POA: Diagnosis not present

## 2013-05-22 DIAGNOSIS — M5137 Other intervertebral disc degeneration, lumbosacral region: Secondary | ICD-10-CM | POA: Diagnosis not present

## 2013-05-23 DIAGNOSIS — M5137 Other intervertebral disc degeneration, lumbosacral region: Secondary | ICD-10-CM | POA: Diagnosis not present

## 2013-05-23 DIAGNOSIS — M999 Biomechanical lesion, unspecified: Secondary | ICD-10-CM | POA: Diagnosis not present

## 2013-05-25 ENCOUNTER — Other Ambulatory Visit: Payer: Self-pay | Admitting: Internal Medicine

## 2013-05-28 DIAGNOSIS — M5137 Other intervertebral disc degeneration, lumbosacral region: Secondary | ICD-10-CM | POA: Diagnosis not present

## 2013-05-28 DIAGNOSIS — M999 Biomechanical lesion, unspecified: Secondary | ICD-10-CM | POA: Diagnosis not present

## 2013-06-01 ENCOUNTER — Telehealth: Payer: Self-pay | Admitting: Neurology

## 2013-06-04 ENCOUNTER — Telehealth: Payer: Self-pay | Admitting: Neurology

## 2013-06-04 DIAGNOSIS — F0390 Unspecified dementia without behavioral disturbance: Secondary | ICD-10-CM

## 2013-06-04 DIAGNOSIS — M5137 Other intervertebral disc degeneration, lumbosacral region: Secondary | ICD-10-CM | POA: Diagnosis not present

## 2013-06-04 DIAGNOSIS — G252 Other specified forms of tremor: Secondary | ICD-10-CM

## 2013-06-04 DIAGNOSIS — M999 Biomechanical lesion, unspecified: Secondary | ICD-10-CM | POA: Diagnosis not present

## 2013-06-04 NOTE — Telephone Encounter (Signed)
Patient may have PT at a local of his choice.

## 2013-06-05 NOTE — Telephone Encounter (Signed)
Pt would like to be referred to:  [facility] [address]  Pt is experiencing weakness in his legs falling frequently.  [facility] and would like to return there.  Referral needed.

## 2013-06-06 DIAGNOSIS — M999 Biomechanical lesion, unspecified: Secondary | ICD-10-CM | POA: Diagnosis not present

## 2013-06-06 DIAGNOSIS — M5137 Other intervertebral disc degeneration, lumbosacral region: Secondary | ICD-10-CM | POA: Diagnosis not present

## 2013-06-07 ENCOUNTER — Encounter: Payer: Self-pay | Admitting: Internal Medicine

## 2013-06-14 ENCOUNTER — Telehealth: Payer: Self-pay | Admitting: Neurology

## 2013-06-25 ENCOUNTER — Telehealth: Payer: Self-pay | Admitting: Internal Medicine

## 2013-06-25 NOTE — Telephone Encounter (Signed)
Pt is requesting refills on tesloratin 5mg , sluoxetine 40 mg and amlopidine 5 mg.

## 2013-06-26 DIAGNOSIS — M5137 Other intervertebral disc degeneration, lumbosacral region: Secondary | ICD-10-CM | POA: Diagnosis not present

## 2013-06-26 DIAGNOSIS — M999 Biomechanical lesion, unspecified: Secondary | ICD-10-CM | POA: Diagnosis not present

## 2013-06-26 DIAGNOSIS — M62838 Other muscle spasm: Secondary | ICD-10-CM | POA: Diagnosis not present

## 2013-06-26 MED ORDER — DESLORATADINE 5 MG PO TABS: 5.0000 mg | ORAL_TABLET | Freq: Every day | ORAL | Status: DC

## 2013-06-26 MED ORDER — AMLODIPINE BESYLATE 5 MG PO TABS: 5.0000 mg | ORAL_TABLET | Freq: Every day | ORAL | Status: DC

## 2013-06-26 MED ORDER — FLUOXETINE HCL 40 MG PO CAPS: 40.0000 mg | ORAL_CAPSULE | ORAL | Status: DC

## 2013-06-26 NOTE — Telephone Encounter (Signed)
Refills done.

## 2013-07-02 ENCOUNTER — Telehealth: Payer: Self-pay | Admitting: Neurology

## 2013-07-03 ENCOUNTER — Encounter: Payer: Self-pay | Admitting: Neurology

## 2013-07-03 ENCOUNTER — Ambulatory Visit (INDEPENDENT_AMBULATORY_CARE_PROVIDER_SITE_OTHER): Payer: Medicare Other | Admitting: Neurology

## 2013-07-03 VITALS — BP 129/68 | HR 60 | Temp 97.7°F | Ht 69.0 in | Wt 220.0 lb

## 2013-07-03 DIAGNOSIS — R269 Unspecified abnormalities of gait and mobility: Secondary | ICD-10-CM

## 2013-07-03 DIAGNOSIS — I259 Chronic ischemic heart disease, unspecified: Secondary | ICD-10-CM

## 2013-07-03 DIAGNOSIS — F0390 Unspecified dementia without behavioral disturbance: Secondary | ICD-10-CM

## 2013-07-03 DIAGNOSIS — M999 Biomechanical lesion, unspecified: Secondary | ICD-10-CM | POA: Diagnosis not present

## 2013-07-03 DIAGNOSIS — M5137 Other intervertebral disc degeneration, lumbosacral region: Secondary | ICD-10-CM | POA: Diagnosis not present

## 2013-07-03 DIAGNOSIS — R296 Repeated falls: Secondary | ICD-10-CM

## 2013-07-03 DIAGNOSIS — M62838 Other muscle spasm: Secondary | ICD-10-CM | POA: Diagnosis not present

## 2013-07-03 DIAGNOSIS — R2689 Other abnormalities of gait and mobility: Secondary | ICD-10-CM

## 2013-07-03 DIAGNOSIS — Z9181 History of falling: Secondary | ICD-10-CM

## 2013-07-03 DIAGNOSIS — G20 Parkinson's disease: Secondary | ICD-10-CM | POA: Diagnosis not present

## 2013-07-03 NOTE — Telephone Encounter (Signed)
Last week he had a appointment, he fell five times he hit his head on the ground went to ER but was fine. Patient wife is very concerned because he is 230 pounds EMS had to come pick him up 3 times in one week . Patient is very confused last visit was with CM on 12-26-2012. Please advise

## 2013-07-03 NOTE — Patient Instructions (Signed)
I think overall you are doing fairly well but I do want to suggest a few things today:  Remember to drink plenty of fluid, eat healthy meals and do not skip any meals. Try to eat protein with a every meal and eat a healthy snack such as fruit or nuts in between meals. Try to keep a regular sleep-wake schedule and try to exercise daily, particularly in the form of walking, 20-30 minutes a day, if you can.   Change position slowly, use your walker at all times.   I am referring you back to PT.   Engage in social activities in your community and with your family and try to keep up with current events by reading the newspaper or watching the news.   As far as your medications are concerned, I would like to suggest increasing your Sinemet to 4 times a day: at 10 am, 2 pm, 6 pm and 10 pm.     As far as diagnostic testing: no new test.  I would for you to see Dr. Vickey Huger in 3 months, sooner if we need to. Please call us with any interim questions, concerns, problems, updates or refill requests.  Our phone number is [phone]. We also have an after hours call service for urgent matters and there is a physician on-call for urgent questions. For any emergencies you know to call 911 or go to the nearest emergency room.

## 2013-07-03 NOTE — Telephone Encounter (Signed)
Carolyn: Patient was last seen by you in February and has from what I gather a long-standing history of recurrent falls and gait disturbance. Can you call patient's wife? If you believe he needs to come in, can you fit him in? I have an opening for 1:30 today, if you need me to see him.

## 2013-07-03 NOTE — Progress Notes (Signed)
Subjective:    Patient ID: [name] [name] is a 78 y.o. male.  HPI  Interim history:   [name] [name] is a very pleasant 78 yo RH gentleman who is seen on behalf of Dr. Vickey Huger who is not in the office today. He is accompanied by his wife today. He has an underlying complex history including parkinsonism, dementia, anxiety, OSA, restless leg syndrome, hearing loss, hypertension, heart disease with status post MI and status post CABG, atrial fibrillation, history of syncope, insomnia, history of colon cancer, asthma, spinal stenosis, thyroid disease, arthritis, and has had recurrent falls. He started seeing Dr. Vickey Huger I believe in 2011 and was most recently seen by our nurse practitioner in February 2014, at which time he was continued on Sinemet 3 times a day and advised to reduce prednisone. His wife called today stating that to him he needed to be seen because of recurrent falls. His appointment was canceled for last week and he was not yet rescheduled for another appointment. He was seen in the emergency room on 05/15/2013 after a fall. He had fallen backwards hitting his head on concrete while trying to get into his car. His wife also reported increasing confusion over the past for 5 days prior to your ED visit. He had a CT head which showed stable atrophy and white matter changes and no acute abnormality. He had chest x-ray which was negative for pneumonia. Laboratory function showed normal electrolytes, normal CBC, normal urinalysis. His wife states, that is legs give out and he has a tendency to fall backwards. He has had PT before and last went in November. PT has helped in the past. She would like a referral for PT again. He went to PT and speech rehab on [address] [phone]).  He has RLS, RBD, OSA on CPAP. He has a very erratic sleep wake schedule. He and his wife go to bed late around 2 AM and he wakes up at 10, but goes back to sleep and over the course of the day, he takes at least 2 three-hour  naps. He uses CPAP at night, but not for his naps.  C/L is helpful, but he does not usually takes the 3 doses. He often misses the last dose. He has no hallucinations.    His Past Medical History Is Significant For: Past Medical History  Diagnosis Date  . ONYCHOMYCOSIS, TOENAILS 06/27/2008  . HYPERLIPIDEMIA 07/28/2007  . ANXIETY 09/28/2007  . OBSTRUCTIVE SLEEP APNEA 01/01/2008  . RESTLESS LEGS SYNDROME 12/26/2007  . HEARING LOSS 06/27/2008  . HYPERTENSION 07/28/2007  . MYOCARDIAL INFARCTION, HX OF 07/28/2007  . CORONARY ARTERY DISEASE 07/28/2007  . Atrial fibrillation 09/28/2007  . SINUSITIS- ACUTE-NOS 10/10/2007  . ALLERGIC RHINITIS 09/28/2007  . CHOLELITHIASIS 09/28/2007  . BENIGN PROSTATIC HYPERTROPHY 09/28/2007  . CELLULITIS, HAND, LEFT 09/24/2010  . SYNCOPE 07/28/2007  . INSOMNIA-SLEEP DISORDER-UNSPEC 04/03/2009  . HYPERSOMNIA WITH SLEEP APNEA UNSPECIFIED 12/26/2007  . FATIGUE 01/01/2008  . DYSPNEA 04/03/2009  . Wheezing 11/06/2009  . ANGIOEDEMA 11/06/2009  . COLON CANCER, HX OF 07/28/2007  . PROSTATE CANCER, HX OF 07/28/2007  . COLONIC POLYPS, HX OF 07/28/2007  . Long term current use of anticoagulant 12/22/2010  . Asthma   . DEMENTIA   . Spinal stenosis   . History of atrial fibrillation   . Lumbar stenosis 10/30/2011  . CAD (coronary artery disease)     Severe three-vessel coronary disease. LIMA to the LAD patent. SVG to PDA patent. SVG to OM 3 patent.  Preserved ejection fraction. 2008.  . Cancer   . Prostate cancer   . Colon cancer   . Thyroid disease   . Arthritis   . Pneumonia   . 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 Prostatectomy  2001  . Cardioversion  2007  . Stress cardiolite  02/08/2007  . Schocardiograph  10/07/2006  . Right thyroid lobectomy    . Coronary artery bypass graft  1995    x 3  . Hernia repair  1998  . Colonoscopy  2010     last done 2010- clear  . Cardiac electrophysiology study and ablation    . Thyroidectomy, partial      His Family History Is Significant For: Family History  Problem Relation Age of Onset  . Coronary artery disease Father     before 33 yo  . Coronary artery disease Sister     before 45 yo  . Hypertension Other     His Social History Is Significant For: History   Social History  . Marital Status: Married    Spouse Name: N/A    Number of Children: 2  . Years of Education: N/A   Occupational History  . retired Chemical engineer    Social History Main Topics  . Smoking status: Never Smoker   . Smokeless tobacco: None  . Alcohol Use: No  . Drug Use: No  . Sexual Activity: None   Other Topics Concern  . None   Social History Narrative  . None    His Allergies Are:  Allergies  Allergen Reactions  . Lisinopril Swelling  :   His Current Medications Are:  Outpatient Encounter Prescriptions as of 07/03/2013  Medication Sig Dispense Refill  . albuterol (PROAIR HFA) 108 (90 BASE) MCG/ACT inhaler Inhale 2 puffs into the lungs every 4 (four) hours as needed. For wheezing  3 Inhaler  3  . ALPRAZolam (XANAX) 0.5 MG tablet Take 1 tablet (0.5 mg total) by mouth 3 (three) times daily as needed.  90 tablet  1  . amLODipine (NORVASC) 5 MG tablet Take 1 tablet (5 mg total) by mouth daily.  90 tablet  3  . apixaban (ELIQUIS) 5 MG TABS tablet Take 1 tablet (5 mg total) by mouth 2 (two) times daily.  180 tablet  3  . atenolol (TENORMIN) 50 MG tablet TAKE 1 TABLET AT BEDTIME  90 tablet  3  . buPROPion (WELLBUTRIN XL) 150 MG 24 hr tablet TAKE 1 TABLET AT BEDTIME  90 tablet  3  . carbidopa-levodopa (SINEMET CR) 25-100 MG per tablet Take 3 tablets by mouth daily. Before meals      . Cholecalciferol (VITAMIN D3) 1000 UNITS CAPS Take 1 capsule by mouth every morning.       . clonazePAM (KLONOPIN) 1 MG tablet Take 1 tablet (1 mg total) by mouth at bedtime as needed for anxiety.  30  tablet  5  . Coenzyme Q10 (CO Q 10 PO) Take 1 tablet by mouth at bedtime.      Marland Kitchen desloratadine (CLARINEX) 5 MG tablet Take 1 tablet (5 mg total) by mouth daily.  90 tablet  3  . docusate sodium (COLACE) 100 MG capsule Take 200 mg by mouth at bedtime.      . donepezil (ARICEPT) 23 MG TABS tablet Take 23 mg by mouth at bedtime.      Marland Kitchen  FLUoxetine (PROZAC) 40 MG capsule Take 1 capsule (40 mg total) by mouth every morning.  90 capsule  3  . fluticasone (VERAMYST) 27.5 MCG/SPRAY nasal spray Place 1 spray into the nose 2 (two) times daily.      . Fluticasone-Salmeterol (ADVAIR) 250-50 MCG/DOSE AEPB Inhale 1 puff into the lungs every 12 (twelve) hours.      Marland Kitchen guaifenesin (HUMIBID E) 400 MG TABS Take 400 mg by mouth every 4 (four) hours as needed. For chest congestion      . ibuprofen (ADVIL,MOTRIN) 200 MG tablet Take 400 mg by mouth every 6 (six) hours as needed for pain.      Marland Kitchen L-Methylfolate-B12-B6-B2 (CEREFOLIN) 04-08-49-5 MG TABS TAKE 1 TABLET DAILY  90 each  3  . levocetirizine (XYZAL) 5 MG tablet Take 1 tablet (5 mg total) by mouth every evening.  90 tablet  3  . Multiple Vitamin (MULTIVITAMINS PO) Take 1 tablet by mouth at bedtime.       . primidone (MYSOLINE) 50 MG tablet TAKE 1 TABLET (50MG ) 2     TIMES A DAY AS DIRECTED  180 tablet  3  . solifenacin (VESICARE) 5 MG tablet Take 10 mg by mouth daily.       No facility-administered encounter medications on file as of 07/03/2013.   Review of Systems  Constitutional: Positive for fatigue.  HENT: Positive for hearing loss, rhinorrhea, trouble swallowing and tinnitus.   Respiratory: Positive for shortness of breath and wheezing.        Uses a CPAP  Gastrointestinal: Positive for constipation.  Musculoskeletal: Positive for myalgias and arthralgias.  Allergic/Immunologic: Positive for environmental allergies.  Neurological: Positive for tremors.       Memory loss Restless leg  Hematological: Bruises/bleeds easily.  Psychiatric/Behavioral:  Positive for confusion, sleep disturbance and dysphoric mood. The patient is nervous/anxious.     Objective:  Neurologic Exam  Physical Exam Physical Examination:   Filed Vitals:   07/03/13 1413  BP: 129/68  Pulse: 60  Temp:    His repeat sitting blood pressure was 121/67 with a pulse of 57 and standing blood pressure was 129/68 with a pulse of 60. He did not report any lightheadedness upon standing.  General Examination: The patient is a very pleasant 78 y.o. male in no acute distress.  HEENT: Normocephalic, atraumatic, pupils are equal, round and reactive to light and accommodation. Funduscopic exam is normal with sharp disc margins noted. Extraocular tracking shows mild saccadic breakdown without nystagmus noted. There is limitation to upper gaze. There is mild decrease in eye blink rate. Hearing is intact. Tympanic membranes are clear bilaterally. Face is symmetric with mild facial masking and normal facial sensation. There is no lip, neck or jaw tremor. Neck is moderately rigid with intact passive ROM. There are no carotid bruits on auscultation. Oropharynx exam reveals mild mouth dryness. No significant airway crowding is noted. Mallampati is class II. Tongue protrudes centrally and palate elevates symmetrically.   There is no drooling.   Chest: is clear to auscultation without wheezing, rhonchi or crackles noted.  Heart: sounds are regular and normal without murmurs, rubs or gallops noted.   Abdomen: is soft, non-tender and non-distended with normal bowel sounds appreciated on auscultation.  Extremities: There is no pitting edema in the distal lower extremities bilaterally. Pedal pulses are intact. There are no varicose veins and there are scars from vein harvesting.  Skin: is warm and dry with no trophic changes noted. Age-related changes are noted on the skin.  Musculoskeletal: exam reveals no obvious joint deformities, tenderness, joint swelling or  erythema.  Neurologically:  Mental status: The patient is awake and alert, paying good  attention. He is able to partially provide the history. His wife provides details of the history. He is oriented to: person, place, situation and year. His memory, attention, language and knowledge are mildly impaired. There is a mild degree of bradyphrenia. Speech is moderately hypophonic with no dysarthria noted. Mood is congruent and affect is normal.   Cranial nerves are as described above under HEENT exam. In addition, shoulder shrug is normal with equal shoulder height noted.  Motor exam: Normal bulk, and strength for age is noted. There are no dyskinesias noted.   Tone is mildly rigid with absence of cogwheeling. There is overall mild bradykinesia. There is no drift or rebound.  There is a slight intermittent resting tremor in the left thumb only.  Romberg is not tested d/t fall risk.   Reflexes are 1+ in the upper extremities and absent in the lower extremities.   Fine motor skills exam: Finger taps are mildly impaired on the right and moderately impaired on the left. Hand movements are mildly impaired on the right and mildly impaired on the left. RAP (rapid alternating patting) is mildly impaired on the right and mildly impaired on the left. Foot taps are mildly impaired on the right and moderately impaired on the left. Foot agility (in the form of heel stomping) is mildly impaired on the right and moderately impaired on the left.    Cerebellar testing shows no dysmetria or intention tremor on finger to nose testing. Heel to shin is unremarkable bilaterally. There is no truncal or gait ataxia.   Sensory exam is intact to light touch, pinprick, vibration, temperature sense and proprioception in the upper and lower extremities.   Gait, station and balance: He stands up from the seated position from his rolling walker with seat with moderate difficulty and does need to push up with His hands. He needs no  assistance. No veering to one side is noted. He is not noted to lean to the side. Posture is moderately stooped. Stance is wide-based. He walks with decrease in stride length and pace and decreased arm swing on the left. He turns in 5 steps. Tandem walk is not possible. Balance is moderately impaired. He is not able to do a toe or heel stance.      Assessment and Plan:   In summary, [name] [name] is a very pleasant 78 y.o.-year old male with a complicated medical Hx who has parkinsonism, possibly left-sided predominant Parkinson's disease, memory loss, gait disorder, likely multifactorial in nature. His physical exam appears to be fairly stable. I had a long chat with him and his wife regarding his complex history, his gait disorder and advancing Parkinsonian findings. His gait disorder seems to be out of proportion to his Parkinsonian findings, therefore I believe his gait disorder is more of a multifactorial problem than just because of his parkinsonism. I've asked him to be more disciplined about using a CPAP during nighttime sleep but also during all naps. I've asked him to have a more scheduled sleep and wake time. I have asked him to increase Sinemet and take it regularly at 4 hourly intervals, namely at 10 AM, 2 PM, 6 PM and 10 PM. He did not need a new prescription today. Furthermore, we will reinitiate physical therapy for gait and balance training and he would like to go back to  the physical therapist he had seen last year. I have asked him to see Dr. Vickey Huger back in 3 months and encouraged them to call with any interim questions, concerns, problems or updates. They were in agreement.

## 2013-07-03 NOTE — Telephone Encounter (Signed)
Called and talked with wife. Pt was due to see Dr. Vickey Huger last week but appt was cancelled because she was out of town.  He has increased confusion and wife  wants him to be seen if possible. My next available is Oct 17th. Dr. Frances Furbish can see this afternoon at 1:30 Asked wife to have him here at 1:15.

## 2013-07-03 NOTE — Telephone Encounter (Signed)
Patients wife is aware. Patient is also on Dr. Frances Furbish schedule.

## 2013-07-04 ENCOUNTER — Encounter: Payer: Self-pay | Admitting: Internal Medicine

## 2013-07-17 DIAGNOSIS — M5137 Other intervertebral disc degeneration, lumbosacral region: Secondary | ICD-10-CM | POA: Diagnosis not present

## 2013-07-17 DIAGNOSIS — M62838 Other muscle spasm: Secondary | ICD-10-CM | POA: Diagnosis not present

## 2013-07-17 DIAGNOSIS — M999 Biomechanical lesion, unspecified: Secondary | ICD-10-CM | POA: Diagnosis not present

## 2013-07-30 DIAGNOSIS — M62838 Other muscle spasm: Secondary | ICD-10-CM | POA: Diagnosis not present

## 2013-07-30 DIAGNOSIS — M5137 Other intervertebral disc degeneration, lumbosacral region: Secondary | ICD-10-CM | POA: Diagnosis not present

## 2013-07-30 DIAGNOSIS — M999 Biomechanical lesion, unspecified: Secondary | ICD-10-CM | POA: Diagnosis not present

## 2013-08-03 ENCOUNTER — Other Ambulatory Visit: Payer: Self-pay | Admitting: Neurology

## 2013-08-08 DIAGNOSIS — M999 Biomechanical lesion, unspecified: Secondary | ICD-10-CM | POA: Diagnosis not present

## 2013-08-08 DIAGNOSIS — M5137 Other intervertebral disc degeneration, lumbosacral region: Secondary | ICD-10-CM | POA: Diagnosis not present

## 2013-08-08 DIAGNOSIS — M62838 Other muscle spasm: Secondary | ICD-10-CM | POA: Diagnosis not present

## 2013-08-09 ENCOUNTER — Telehealth: Payer: Self-pay

## 2013-08-09 ENCOUNTER — Encounter: Payer: Self-pay | Admitting: Internal Medicine

## 2013-08-09 ENCOUNTER — Ambulatory Visit (INDEPENDENT_AMBULATORY_CARE_PROVIDER_SITE_OTHER): Payer: Medicare Other | Admitting: Internal Medicine

## 2013-08-09 VITALS — BP 142/70 | HR 60 | Ht 69.0 in | Wt 222.2 lb

## 2013-08-09 DIAGNOSIS — Z86010 Personal history of colonic polyps: Secondary | ICD-10-CM | POA: Diagnosis not present

## 2013-08-09 DIAGNOSIS — I4891 Unspecified atrial fibrillation: Secondary | ICD-10-CM | POA: Diagnosis not present

## 2013-08-09 DIAGNOSIS — Z7901 Long term (current) use of anticoagulants: Secondary | ICD-10-CM | POA: Diagnosis not present

## 2013-08-09 DIAGNOSIS — K649 Unspecified hemorrhoids: Secondary | ICD-10-CM

## 2013-08-09 MED ORDER — NA SULFATE-K SULFATE-MG SULF 17.5-3.13-1.6 GM/177ML PO SOLN: ORAL | Status: DC

## 2013-08-09 NOTE — Patient Instructions (Addendum)
You have been scheduled for a colonoscopy with propofol. Please follow written instructions given to you at your visit today.  Please use the suprep kit we have given you today. If you use inhalers (even only as needed), please bring them with you on the day of your procedure. Your physician has requested that you go to www.startemmi.com and enter the access code given to you at your visit today. This web site gives a general overview about your procedure. However, you should still follow specific instructions given to you by our office regarding your preparation for the procedure.  We have given you a coupon for Align. This puts good bacteria back into your colon. You should take 1 capsule by mouth once daily. It can be purchased over the counter. Try this for a month.  You will be contaced by our office prior to your procedure for directions on holding your Eliquis.  If you do not hear from our office 1 week prior to your scheduled procedure, please call [phone] to discuss.  I appreciate the opportunity to care for you.

## 2013-08-09 NOTE — Telephone Encounter (Signed)
[facility] [address] [place] [address]  08/09/2013    RE: [name] [name] DOB: 1935/05/26 MRN: [id number]   Dear Dr. Clydene Pugh,    We have scheduled the above patient for an endoscopic procedure. Our records show that he is on anticoagulation therapy.   Please advise as to how long the patient may come off his therapy of Eliquis prior to the colonoscopy procedure, which is scheduled for 08/22/13.  Please fax back/ or route the completed form to Marchell Froman [place], CMA (AAMA) at [phone].   Sincerely,  Hendel Gatliff [place], CMA (AAMA)

## 2013-08-09 NOTE — Progress Notes (Signed)
  Subjective:    Patient ID: [name] [name], male    DOB: 01-21-1935, 78 y.o.   MRN: [id number]  HPI Here for 5 yr follow-up exam. The patient has a history of adenomatous colon polyps as well as a history of colon cancer removed at colonoscopy polypectomy 2002. I last performed colonoscopy in 2009, no polyps were present, the prior year he had 10 polyps present and removed. He has been in declining health the son agree with worsening Parkinson's. He has mild dementia. His wife is caring for him. She says she's had swollen protruding hemorrhoids which are much better. He does not suffer from significant constipation or diarrhea. There is rare rectal bleeding with wiping the wife says.  Medications, allergies, past medical history, past surgical history, family history and social history are reviewed and updated in the EMR.  Review of Systems Positive for some increasing leg weakness. Worsening dementia. Decreased hearing. In place with a walker and is able to do that reasonably well though he does have some falls.    Objective:   Physical Exam Elderly in walker NAD Lungs clr Heart S1S2 no rmg Abd obese  I have reviewed previous colonoscopy and pathology reports.      Assessment & Plan:   1. Personal history of colonic polyps   2. Long term (current) use of anticoagulants   3. Atrial fibrillation   4. Hemorrhoids    1. He's in a gray zone, we discussed the pros and cons of continuing colonoscopy surveillance. The patient and his wife are inclined to do so one more time. I think that is reasonable. 2. He will have to hold his apixiban for 1-2 days before procedure if Dr. Antoine Poche ok's this. I explained that the usual risks of colonoscopy apply and  Also risk of stroke off anti-coagulant applies though not great its real. 3. He is having increasing falls and is on ant-coagulant. Using a walker and wife adapting house but need to review risk/benefit of anticoagulation as an ongoing  issue. 4. Will assess hemorrhoids at colonoscopy. Banding is a possibility but would be in office and have to hold anti-coagulant again so will see - high threshold to treat these.  I appreciate the opportunity to care for this patient. JY:NWGNF [name] Ruiz, MD Rollene Rotunda, MD

## 2013-08-13 DIAGNOSIS — M5137 Other intervertebral disc degeneration, lumbosacral region: Secondary | ICD-10-CM | POA: Diagnosis not present

## 2013-08-13 DIAGNOSIS — M62838 Other muscle spasm: Secondary | ICD-10-CM | POA: Diagnosis not present

## 2013-08-13 DIAGNOSIS — M999 Biomechanical lesion, unspecified: Secondary | ICD-10-CM | POA: Diagnosis not present

## 2013-08-14 NOTE — Telephone Encounter (Signed)
OK to discontinue 48 hours prior to the procedure.

## 2013-08-14 NOTE — Telephone Encounter (Signed)
Re-sent clearance letter 08/14/13.

## 2013-08-15 NOTE — Telephone Encounter (Signed)
I have spoken to the patient and his wife and have advised them that per Dr Antoine Poche, patient may hold his Eliquis 48 hours prior to his colonoscopy procedure on 09/03/13. They both verbalize understanding.

## 2013-08-18 ENCOUNTER — Other Ambulatory Visit: Payer: Self-pay | Admitting: Internal Medicine

## 2013-08-20 ENCOUNTER — Telehealth: Payer: Self-pay

## 2013-08-20 MED ORDER — DONEPEZIL HCL 23 MG PO TABS: ORAL_TABLET | ORAL | Status: DC

## 2013-08-20 MED ORDER — CLONAZEPAM 1 MG PO TABS: ORAL_TABLET | ORAL | Status: DC

## 2013-08-20 MED ORDER — CLONAZEPAM 1 MG PO TABS: 1.0000 mg | ORAL_TABLET | Freq: Two times a day (BID) | ORAL | Status: DC | PRN

## 2013-08-20 NOTE — Telephone Encounter (Signed)
Received fax from [facility] asking if it was ok to send new RX to [facility]. Please advise if ok to refill Clonazepam 1 MG tab quantity 90 with no refills and Xanax 0.5 MG quantity 90 with no refills.  Thanks!

## 2013-08-20 NOTE — Addendum Note (Signed)
Addended by: Corwin Levins on: 08/20/2013 07:17 PM   Modules accepted: Orders, Medications

## 2013-08-20 NOTE — Telephone Encounter (Addendum)
To let [facility] know - hold off for now  Robin to contact wife, due to both meds being similar and some difficulty with the pharmacy, we should change the klonopin bid prn (half to 1 in the am, 1 in the PM)

## 2013-08-21 NOTE — Telephone Encounter (Signed)
Called the patient left message on both cell and home number to call back 

## 2013-08-21 NOTE — Telephone Encounter (Signed)
Called [facility] informed of MD instructions.   Faxed hardcopy to [facility].

## 2013-08-22 ENCOUNTER — Encounter: Payer: Medicare Other | Admitting: Internal Medicine

## 2013-08-22 MED ORDER — ALPRAZOLAM 0.5 MG PO TABS: 0.5000 mg | ORAL_TABLET | Freq: Three times a day (TID) | ORAL | Status: DC | PRN

## 2013-08-22 MED ORDER — CLONAZEPAM 1 MG PO TABS: ORAL_TABLET | ORAL | Status: DC

## 2013-08-22 NOTE — Telephone Encounter (Signed)
Advise please on wife's information

## 2013-08-22 NOTE — Telephone Encounter (Signed)
Although this is unsual, ok to take the meds as per wife's suggestion  I will re-do rx, to cancel most recent klonopin rx

## 2013-08-22 NOTE — Addendum Note (Signed)
Addended by: Corwin Levins on: 08/22/2013 07:13 PM   Modules accepted: Orders

## 2013-08-22 NOTE — Telephone Encounter (Signed)
Called the patients wife and she stated the patient cannot take clonazepam during the day as makes him too loopy and would not be able to walk.  He takes one only at night for his restless legs.  She also stated he needs the Alprazolam, but takes only occasionally, not everyday.  Advise

## 2013-08-23 NOTE — Telephone Encounter (Signed)
Called [facility] to inform to cancen recent klonopin sent in yesterday.  Informed of new instructions as well as ok to fill the xanax.  Faxed both prescriptions to [facility].  Called the patients wife informed of change.

## 2013-08-28 DIAGNOSIS — M5137 Other intervertebral disc degeneration, lumbosacral region: Secondary | ICD-10-CM | POA: Diagnosis not present

## 2013-08-28 DIAGNOSIS — M999 Biomechanical lesion, unspecified: Secondary | ICD-10-CM | POA: Diagnosis not present

## 2013-08-28 DIAGNOSIS — M62838 Other muscle spasm: Secondary | ICD-10-CM | POA: Diagnosis not present

## 2013-08-29 ENCOUNTER — Encounter: Payer: Self-pay | Admitting: Internal Medicine

## 2013-08-29 ENCOUNTER — Ambulatory Visit (INDEPENDENT_AMBULATORY_CARE_PROVIDER_SITE_OTHER): Payer: Medicare Other | Admitting: Internal Medicine

## 2013-08-29 ENCOUNTER — Other Ambulatory Visit (INDEPENDENT_AMBULATORY_CARE_PROVIDER_SITE_OTHER): Payer: Medicare Other

## 2013-08-29 VITALS — BP 120/62 | HR 63 | Temp 98.8°F | Ht 69.0 in | Wt 221.4 lb

## 2013-08-29 DIAGNOSIS — Z9181 History of falling: Secondary | ICD-10-CM | POA: Diagnosis not present

## 2013-08-29 DIAGNOSIS — Z23 Encounter for immunization: Secondary | ICD-10-CM | POA: Diagnosis not present

## 2013-08-29 DIAGNOSIS — R269 Unspecified abnormalities of gait and mobility: Secondary | ICD-10-CM

## 2013-08-29 DIAGNOSIS — R531 Weakness: Secondary | ICD-10-CM

## 2013-08-29 DIAGNOSIS — E785 Hyperlipidemia, unspecified: Secondary | ICD-10-CM

## 2013-08-29 DIAGNOSIS — F068 Other specified mental disorders due to known physiological condition: Secondary | ICD-10-CM

## 2013-08-29 DIAGNOSIS — R5381 Other malaise: Secondary | ICD-10-CM

## 2013-08-29 DIAGNOSIS — R296 Repeated falls: Secondary | ICD-10-CM

## 2013-08-29 DIAGNOSIS — E538 Deficiency of other specified B group vitamins: Secondary | ICD-10-CM

## 2013-08-29 DIAGNOSIS — I10 Essential (primary) hypertension: Secondary | ICD-10-CM

## 2013-08-29 LAB — BASIC METABOLIC PANEL
Calcium: 9.4 mg/dL (ref 8.4–10.5)
Chloride: 101 mEq/L (ref 96–112)
Creatinine, Ser: 0.9 mg/dL (ref 0.4–1.5)
Potassium: 4.5 mEq/L (ref 3.5–5.1)

## 2013-08-29 LAB — HEPATIC FUNCTION PANEL
ALT: 4 U/L (ref 0–53)
AST: 23 U/L (ref 0–37)
Bilirubin, Direct: 0.1 mg/dL (ref 0.0–0.3)
Total Bilirubin: 1.2 mg/dL (ref 0.3–1.2)

## 2013-08-29 LAB — CBC WITH DIFFERENTIAL/PLATELET
Basophils Relative: 0.9 % (ref 0.0–3.0)
Eosinophils Absolute: 0.1 10*3/uL (ref 0.0–0.7)
Hemoglobin: 15 g/dL (ref 13.0–17.0)
Lymphs Abs: 1.3 10*3/uL (ref 0.7–4.0)
MCHC: 34.3 g/dL (ref 30.0–36.0)
MCV: 93.6 fl (ref 78.0–100.0)
Monocytes Absolute: 0.8 10*3/uL (ref 0.1–1.0)
Neutro Abs: 4.3 10*3/uL (ref 1.4–7.7)
Neutrophils Relative %: 64.8 % (ref 43.0–77.0)
Platelets: 203 10*3/uL (ref 150.0–400.0)
RBC: 4.68 Mil/uL (ref 4.22–5.81)

## 2013-08-29 LAB — LIPID PANEL
Cholesterol: 166 mg/dL (ref 0–200)
Total CHOL/HDL Ratio: 5
VLDL: 73.8 mg/dL — ABNORMAL HIGH (ref 0.0–40.0)

## 2013-08-29 LAB — URINALYSIS, ROUTINE W REFLEX MICROSCOPIC
Bilirubin Urine: NEGATIVE
Hgb urine dipstick: NEGATIVE
Ketones, ur: NEGATIVE
Nitrite: NEGATIVE
Total Protein, Urine: NEGATIVE
Urine Glucose: NEGATIVE

## 2013-08-29 LAB — LDL CHOLESTEROL, DIRECT: Direct LDL: 76.5 mg/dL

## 2013-08-29 NOTE — Assessment & Plan Note (Signed)
Ok for PT eval and tx 

## 2013-08-29 NOTE — Assessment & Plan Note (Signed)
stable overall by history and exam, recent data reviewed with pt, and pt to continue medical treatment as before,  to f/u any worsening symptoms or concerns BP Readings from Last 3 Encounters:  08/29/13 120/62  08/09/13 142/70  07/03/13 129/68

## 2013-08-29 NOTE — Progress Notes (Addendum)
Subjective:    Patient ID: [name] [name], male    DOB: [birth year], 78 y.o.   MRN: [id number]  HPI  Here to f/u, did have recurrent falls and confusion acute on chronic about July 4 ; had labs and head CT ok in the ER, still falls occasionally without injury, more risk with tiredness and before bedtime.  Now legs weaker, needs PT, neurology on vacation. Pt denies chest pain, increased sob or doe, wheezing, orthopnea, PND, increased LE swelling, palpitations, dizziness or syncope.  Pt denies new neurological symptoms such as new headache, or facial weakness or numbness   Pt denies polydipsia, polyuria.  Has ongoing left shoulder pain due to severe known deg arthritis.  Sees Dr Fonnie Birkenhead.  Dementia overall stable symptomatically with gradual worsening at best, and not assoc with behavioral changes such as hallucinations, paranoia, or agitation.  Past Medical History  Diagnosis Date  . ONYCHOMYCOSIS, TOENAILS 06/27/2008  . HYPERLIPIDEMIA 07/28/2007  . ANXIETY 09/28/2007  . OBSTRUCTIVE SLEEP APNEA 01/01/2008  . RESTLESS LEGS SYNDROME 12/26/2007  . HEARING LOSS 06/27/2008  . HYPERTENSION 07/28/2007  . MYOCARDIAL INFARCTION, HX OF 07/28/2007  . CORONARY ARTERY DISEASE 07/28/2007  . Atrial fibrillation 09/28/2007  . SINUSITIS- ACUTE-NOS 10/10/2007  . ALLERGIC RHINITIS 09/28/2007  . CHOLELITHIASIS 09/28/2007  . BENIGN PROSTATIC HYPERTROPHY 09/28/2007  . CELLULITIS, HAND, LEFT 09/24/2010  . SYNCOPE 07/28/2007  . INSOMNIA-SLEEP DISORDER-UNSPEC 04/03/2009  . HYPERSOMNIA WITH SLEEP APNEA UNSPECIFIED 12/26/2007  . FATIGUE 01/01/2008  . DYSPNEA 04/03/2009  . Wheezing 11/06/2009  . ANGIOEDEMA 11/06/2009  . COLON CANCER, HX OF 07/28/2007  . PROSTATE CANCER, HX OF 07/28/2007  . COLONIC POLYPS, HX OF 07/28/2007  . Long term current use of anticoagulant 12/22/2010  . Asthma   . DEMENTIA   . Spinal stenosis   . History of atrial fibrillation   . Lumbar stenosis 10/30/2011  . CAD (coronary artery disease)     Severe  three-vessel coronary disease. LIMA to the LAD patent. SVG to PDA patent. SVG to OM 3 patent. Preserved ejection fraction. 2008.  . Cancer   . Thyroid disease   . Arthritis   . Pneumonia   . 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 Prostatectomy  2001  . Cardioversion  2007  . Stress cardiolite  02/08/2007  . Schocardiograph  10/07/2006  . Right thyroid lobectomy    . Coronary artery bypass graft  1995    x 3  . Hernia repair  1998  . Colonoscopy  2010    last done 2010- clear  . Cardiac electrophysiology study and ablation    . Thyroidectomy, partial      reports that he has never smoked. He has never used smokeless tobacco. He reports that he does not drink alcohol or use illicit drugs. family history includes Coronary artery disease in his father and sister; Hypertension in his other. Allergies  Allergen Reactions  . Lisinopril Swelling   Current Outpatient Prescriptions on File Prior to Visit  Medication Sig Dispense Refill  . albuterol (PROAIR HFA) 108 (90 BASE) MCG/ACT inhaler Inhale 2 puffs into the lungs every 4 (four) hours as needed. For wheezing  3 Inhaler  3  . ALPRAZolam (XANAX) 0.5 MG tablet Take 1 tablet (0.5 mg total) by mouth 3 (three) times daily as needed.  90 tablet  1  . amLODipine (NORVASC) 5 MG tablet Take 1 tablet (5 mg total) by mouth daily.  90 tablet  3  . apixaban (ELIQUIS) 5 MG TABS tablet Take 1 tablet (5 mg total) by mouth 2 (two) times daily.  180 tablet  3  . atenolol (TENORMIN) 50 MG tablet TAKE 1 TABLET AT BEDTIME  90 tablet  3  . buPROPion (WELLBUTRIN XL) 150 MG 24 hr tablet TAKE 1 TABLET AT BEDTIME  90 tablet  3  . carbidopa-levodopa (SINEMET CR) 25-100 MG per tablet Take 4 tablets by mouth daily. Before meals      . Cholecalciferol (VITAMIN D3) 1000 UNITS CAPS Take 1 capsule by mouth every morning.       . clonazePAM (KLONOPIN) 1 MG tablet 1 by mouth in the evening  30  tablet  1  . Coenzyme Q10 (CO Q 10 PO) Take 1 tablet by mouth at bedtime.      Marland Kitchen desloratadine (CLARINEX) 5 MG tablet Take 1 tablet (5 mg total) by mouth daily.  90 tablet  3  . docusate sodium (COLACE) 100 MG capsule Take 200 mg by mouth at bedtime.      . donepezil (ARICEPT) 23 MG TABS tablet TAKE 1 TABLET EVERY MORNING  30 tablet  0  . FLUoxetine (PROZAC) 40 MG capsule Take 1 capsule (40 mg total) by mouth every morning.  90 capsule  3  . fluticasone (VERAMYST) 27.5 MCG/SPRAY nasal spray Place 1 spray into the nose 2 (two) times daily.      . Fluticasone-Salmeterol (ADVAIR) 250-50 MCG/DOSE AEPB Inhale 1 puff into the lungs every 12 (twelve) hours.      Marland Kitchen guaifenesin (HUMIBID E) 400 MG TABS Take 400 mg by mouth every 4 (four) hours as needed. For chest congestion      . ibuprofen (ADVIL,MOTRIN) 200 MG tablet Take 400 mg by mouth every 6 (six) hours as needed for pain.      Marland Kitchen L-Methylfolate-B12-B6-B2 (CEREFOLIN) 04-08-49-5 MG TABS TAKE 1 TABLET DAILY  90 each  3  . levocetirizine (XYZAL) 5 MG tablet Take 1 tablet (5 mg total) by mouth every evening.  90 tablet  3  . Multiple Vitamin (MULTIVITAMINS PO) Take 1 tablet by mouth at bedtime.       . Na Sulfate-K Sulfate-Mg Sulf (SUPREP BOWEL PREP) SOLN Use as directed  2 Bottle  0  . primidone (MYSOLINE) 50 MG tablet TAKE 1 TABLET (50MG ) 2     TIMES A DAY AS DIRECTED  180 tablet  3  . solifenacin (VESICARE) 5 MG tablet Take 10 mg by mouth daily.      . VESICARE 5 MG tablet TAKE 1 TABLET EVERY MORNING  90 tablet  3   No current facility-administered medications on file prior to visit.   Review of Systems  s/p cataract surgury both eyes earlier this yr  Constitutional: Negative for unexpected weight change, or unusual diaphoresis  HENT: Negative for tinnitus.   Eyes: Negative for photophobia and visual disturbance.  Respiratory: Negative for choking and stridor.   Gastrointestinal: Negative for vomiting and blood in stool.  Genitourinary: Negative  for hematuria and decreased urine volume.  Musculoskeletal: Negative for acute joint swelling Skin: Negative for color change and wound.  Neurological: Negative for tremors and numbness other than noted  Psychiatric/Behavioral: Negative for decreased concentration or  hyperactivity.       Objective:   Physical Exam BP 120/62  Pulse 63  Temp(Src) 98.8 F (37.1 C) (Oral)  Ht 5\' 9"  (1.753 m)  Wt 221 lb 6 oz (100.415 kg)  BMI 32.68  kg/m2  SpO2 94% VS noted,  Constitutional: Pt appears well-developed and well-nourished.  HENT: Head: NCAT.  Right Ear: External ear normal.  Left Ear: External ear normal.  Eyes: Conjunctivae and EOM are normal. Pupils are equal, round, and reactive to light.  Neck: Normal range of motion. Neck supple.  Cardiovascular: Normal rate and regular rhythm.   Pulmonary/Chest: Effort normal and breath sounds normal.  Abd:  Soft, NT, non-distended, + BS Neurological: Pt is alert. Has baseline confused  Skin: Skin is warm. No erythema.  Psychiatric: Pt behavior is normal. Not depressed appearing or nervous    Assessment & Plan:  Quality Measures addressed:  CAD  - drug therapy for lower cholesterol: pt declines further medication ACE/ARB therapy for CAD, Diabetes, and/or LVSD: pt declines further medication

## 2013-08-29 NOTE — Patient Instructions (Addendum)
You had the flu shot today Please return in 2 wks for Nurse Visit for the new Prevnar pneumonia shot  Please continue all other medications as before, and refills have been done if requested. Please have the pharmacy call with any other refills you may need.  You will be contacted regarding the referral for: Physical Therapy  Please keep your appointments with your specialists as you have planned   Please go to the LAB in the Basement (turn left off the elevator) for the tests to be done today You will be contacted by phone if any changes need to be made immediately.  Otherwise, you will receive a letter about your results with an explanation, but please check with MyChart first.  Please return in 6 months, or sooner if needed

## 2013-08-29 NOTE — Assessment & Plan Note (Signed)
stable overall by history and exam, recent data reviewed with pt, and pt to continue medical treatment as before,  to f/u any worsening symptoms or concerns Lab Results  Component Value Date   LDLCALC 58 01/01/2008

## 2013-08-29 NOTE — Assessment & Plan Note (Signed)
stable overall by history and exam, recent data reviewed with pt, and pt to continue medical treatment as before,  to f/u any worsening symptoms or concerns Lab Results  Component Value Date   WBC 6.6 08/29/2013   HGB 15.0 08/29/2013   HCT 43.8 08/29/2013   PLT 203.0 08/29/2013   GLUCOSE 88 08/29/2013   CHOL 166 08/29/2013   TRIG 369.0* 08/29/2013   HDL 35.70* 08/29/2013   LDLDIRECT 76.5 08/29/2013   LDLCALC 58 01/01/2008   ALT 4 08/29/2013   AST 23 08/29/2013   NA 140 08/29/2013   K 4.5 08/29/2013   CL 101 08/29/2013   CREATININE 0.9 08/29/2013   BUN 17 08/29/2013   CO2 32 08/29/2013   TSH 1.42 08/29/2013   PSA 0.03* 03/04/2010   INR 2.3 12/01/2012   HGBA1C 5.3 03/04/2010

## 2013-09-03 ENCOUNTER — Telehealth: Payer: Self-pay

## 2013-09-03 ENCOUNTER — Ambulatory Visit (AMBULATORY_SURGERY_CENTER): Payer: Medicare Other | Admitting: Internal Medicine

## 2013-09-03 ENCOUNTER — Encounter: Payer: Self-pay | Admitting: Internal Medicine

## 2013-09-03 VITALS — BP 132/67 | HR 54 | Temp 98.1°F | Resp 19 | Ht 69.0 in | Wt 222.0 lb

## 2013-09-03 DIAGNOSIS — K648 Other hemorrhoids: Secondary | ICD-10-CM | POA: Diagnosis not present

## 2013-09-03 DIAGNOSIS — D126 Benign neoplasm of colon, unspecified: Secondary | ICD-10-CM

## 2013-09-03 DIAGNOSIS — Z860100 Personal history of colon polyps, unspecified: Secondary | ICD-10-CM

## 2013-09-03 DIAGNOSIS — I2510 Atherosclerotic heart disease of native coronary artery without angina pectoris: Secondary | ICD-10-CM | POA: Diagnosis not present

## 2013-09-03 DIAGNOSIS — K649 Unspecified hemorrhoids: Secondary | ICD-10-CM

## 2013-09-03 DIAGNOSIS — G20 Parkinson's disease: Secondary | ICD-10-CM | POA: Diagnosis not present

## 2013-09-03 DIAGNOSIS — I10 Essential (primary) hypertension: Secondary | ICD-10-CM | POA: Diagnosis not present

## 2013-09-03 DIAGNOSIS — I252 Old myocardial infarction: Secondary | ICD-10-CM | POA: Diagnosis not present

## 2013-09-03 DIAGNOSIS — Z8679 Personal history of other diseases of the circulatory system: Secondary | ICD-10-CM | POA: Diagnosis not present

## 2013-09-03 DIAGNOSIS — G4733 Obstructive sleep apnea (adult) (pediatric): Secondary | ICD-10-CM | POA: Diagnosis not present

## 2013-09-03 MED ORDER — SODIUM CHLORIDE 0.9 % IV SOLN: 500.0000 mL | INTRAVENOUS | Status: DC

## 2013-09-03 NOTE — Progress Notes (Signed)
Procedure ends, to recovery, report given and VSS. 

## 2013-09-03 NOTE — Op Note (Signed)
[facility] [address] [place], [address]   COLONOSCOPY PROCEDURE REPORT  PATIENT: [name] [name], [name] [name]  MR#: [id number] BIRTHDATE: 04-26-1935 , 78  yrs. old GENDER: Male ENDOSCOPIST: Iva Boop, MD, [facility] PROCEDURE DATE:  09/03/2013 PROCEDURE:   Colonoscopy with snare polypectomy First Screening Colonoscopy - Avg.  risk and is 50 yrs.  old or older - No.  Prior Negative Screening - Now for repeat screening. N/A  History of Adenoma - Now for follow-up colonoscopy & has been > or = to 3 yrs.  Yes hx of adenoma.  Has been 3 or more years since last colonoscopy.  Polyps Removed Today? Yes. ASA CLASS:   Class III INDICATIONS:High risk patient with personal history of colon cancer and Patient's personal history of adenomatous colon polyps. MEDICATIONS: Propofol (Diprivan) 130 mg IV, MAC sedation, administered by CRNA, and These medications were titrated to patient response per physician's verbal order  DESCRIPTION OF PROCEDURE:   After the risks benefits and alternatives of the procedure were thoroughly explained, informed consent was obtained.  A digital rectal exam revealed internal hemorrhoids - Grade 3 prolapse LL, A digital rectal exam revealed no rectal mass, A digital rectal exam revealed the prostate was not enlarged, and A digital rectal exam revealed no prostatic nodules. The LB [id number]  endoscope was introduced through the anus and advanced to the cecum, which was identified by both the appendix and ileocecal valve. No adverse events experienced.   The quality of the prep was Suprep adequate  The instrument was then slowly withdrawn as the colon was fully examined.  COLON FINDINGS: Two sessile polyps measuring 5 and 8 mm in size were found in the ascending colon and transverse colon.  A polypectomy was performed with a cold snare.  The resection was complete and the polyp tissue was completely retrieved.   Large internal hemorrhoids were found.    The colon mucosa was otherwise normal. Retroflexed views revealed internal hemorrhoids. The time to cecum=4 minutes 58 seconds.  Withdrawal time=1149 minutes 0 seconds.  The scope was withdrawn and the procedure completed. COMPLICATIONS: There were no complications. ENDOSCOPIC IMPRESSION: 1.   Two sessile polyps measuring 5 and 8 mm in size were found in the ascending colon and transverse colon; polypectomy was performed with a cold snare 2.   Large internal hemorrhoids 3.   The colon mucosa was otherwise normal - adequate prep, personal hx colon cancer and adenomas  RECOMMENDATIONS: 1.  Await pathology results 2.   Likely no routine repeat colonoscopy 3.   resume Eloquis tomorrow 4.   If hemorrhoids cause more problems return to clinic to reassess  eSigned:  Iva Boop, MD, [facility] 09/03/2013 2:54 PM cc: The Patient

## 2013-09-03 NOTE — Progress Notes (Signed)
Called to room to assist during endoscopic procedure.  Patient ID and intended procedure confirmed with present staff. Received instructions for my participation in the procedure from the performing physician.  

## 2013-09-03 NOTE — Telephone Encounter (Signed)
Message copied by Annett Fabian on Mon Sep 03, 2013  3:21 PM ------      Message from: Iva Boop      Created: Mon Sep 03, 2013  2:59 PM      Regarding: refer to Dr. Michaell Cowing       Please have him see Dr. Michaell Cowing re: hemorrhoids      Let me know when and I will communicate with Dr. Michaell Cowing about the pt ------

## 2013-09-03 NOTE — Patient Instructions (Addendum)
I found and removed 2 polyps that look benign. You also have hemorrhoids.  Please restart the Eloquis (apixiban) tomorrow.  If the hemorrhoids are a persistent problem I can reassess and we can consider treatment.  I appreciate the opportunity to care for you. Iva Boop, MD, FACG  YOU HAD AN ENDOSCOPIC PROCEDURE TODAY AT THE [facility]: Refer to the procedure report that was given to you for any specific questions about what was found during the examination.  If the procedure report does not answer your questions, please call your gastroenterologist to clarify.  If you requested that your care partner not be given the details of your procedure findings, then the procedure report has been included in a sealed envelope for you to review at your convenience later.  YOU SHOULD EXPECT: Some feelings of bloating in the abdomen. Passage of more gas than usual.  Walking can help get rid of the air that was put into your GI tract during the procedure and reduce the bloating. If you had a lower endoscopy (such as a colonoscopy or flexible sigmoidoscopy) you may notice spotting of blood in your stool or on the toilet paper. If you underwent a bowel prep for your procedure, then you may not have a normal bowel movement for a few days.  DIET: Your first meal following the procedure should be a light meal and then it is ok to progress to your normal diet.  A half-sandwich or bowl of soup is an example of a good first meal.  Heavy or fried foods are harder to digest and may make you feel nauseous or bloated.  Likewise meals heavy in dairy and vegetables can cause extra gas to form and this can also increase the bloating.  Drink plenty of fluids but you should avoid alcoholic beverages for 24 hours.  ACTIVITY: Your care partner should take you home directly after the procedure.  You should plan to take it easy, moving slowly for the rest of the day.  You can resume normal activity the day after  the procedure however you should NOT DRIVE or use heavy machinery for 24 hours (because of the sedation medicines used during the test).    SYMPTOMS TO REPORT IMMEDIATELY: A gastroenterologist can be reached at any hour.  During normal business hours, 8:30 AM to 5:00 PM Monday through Friday, call [phone].  After hours and on weekends, please call the GI answering service at [phone] who will take a message and have the physician on call contact you.   Following lower endoscopy (colonoscopy or flexible sigmoidoscopy):  Excessive amounts of blood in the stool  Significant tenderness or worsening of abdominal pains  Swelling of the abdomen that is new, acute  Fever of 100F or higher   FOLLOW UP: If any biopsies were taken you will be contacted by phone or by letter within the next 1-3 weeks.  Call your gastroenterologist if you have not heard about the biopsies in 3 weeks.  Our staff will call the home number listed on your records the next business day following your procedure to check on you and address any questions or concerns that you may have at that time regarding the information given to you following your procedure. This is a courtesy call and so if there is no answer at the home number and we have not heard from you through the emergency physician on call, we will assume that you have returned to your regular daily activities  without incident.  SIGNATURES/CONFIDENTIALITY: You and/or your care partner have signed paperwork which will be entered into your electronic medical record.  These signatures attest to the fact that that the information above on your After Visit Summary has been reviewed and is understood.  Full responsibility of the confidentiality of this discharge information lies with you and/or your care-partner.  Polyps, Hemorrhoids-handouts given  Repeat colonoscopy will be determined by pathology.  If hemorrhoids bother you, please set up appointment at  clinic.  Resume Eloquis tomorrow 09/04/13.

## 2013-09-03 NOTE — Telephone Encounter (Signed)
Patient is scheduled to see Dr. Michaell Cowing 09/19/13 10:15 .  He needs to arrive at 9:45.  [facility] recovery will give the patient the appt information

## 2013-09-03 NOTE — Progress Notes (Addendum)
Patient did not experience any of the following events: a burn prior to discharge; a fall within the facility; wrong site/side/patient/procedure/implant event; or a hospital transfer or hospital admission upon discharge from the facility. ([id number])Patient did not have preoperative order for IV antibiotic SSI prophylaxis. [phone]  Pt had to wait for ride. Sheri brought surgical consult appointment sheet up from downstairs. Appointment sheet was given to pt care taker.

## 2013-09-04 ENCOUNTER — Telehealth: Payer: Self-pay | Admitting: *Deleted

## 2013-09-04 NOTE — Telephone Encounter (Signed)
  Follow up Call-  Call back number 09/03/2013  Post procedure Call Back phone  # [phone]  Permission to leave phone message Yes     Patient questions:  Do you have a fever, pain , or abdominal swelling? no Pain Score  0 *  Have you tolerated food without any problems? yes  Have you been able to return to your normal activities? yes  Do you have any questions about your discharge instructions: Diet   no Medications  no Follow up visit  no  Do you have questions or concerns about your Care? no  Actions: * If pain score is 4 or above: No action needed, pain <4.  Spoke with pt wife.

## 2013-09-05 DIAGNOSIS — R269 Unspecified abnormalities of gait and mobility: Secondary | ICD-10-CM | POA: Diagnosis not present

## 2013-09-05 DIAGNOSIS — M6281 Muscle weakness (generalized): Secondary | ICD-10-CM | POA: Diagnosis not present

## 2013-09-10 ENCOUNTER — Encounter: Payer: Self-pay | Admitting: Internal Medicine

## 2013-09-11 DIAGNOSIS — M6281 Muscle weakness (generalized): Secondary | ICD-10-CM | POA: Diagnosis not present

## 2013-09-11 DIAGNOSIS — R269 Unspecified abnormalities of gait and mobility: Secondary | ICD-10-CM | POA: Diagnosis not present

## 2013-09-12 ENCOUNTER — Ambulatory Visit (INDEPENDENT_AMBULATORY_CARE_PROVIDER_SITE_OTHER): Payer: Medicare Other

## 2013-09-12 DIAGNOSIS — M999 Biomechanical lesion, unspecified: Secondary | ICD-10-CM | POA: Diagnosis not present

## 2013-09-12 DIAGNOSIS — M5137 Other intervertebral disc degeneration, lumbosacral region: Secondary | ICD-10-CM | POA: Diagnosis not present

## 2013-09-12 DIAGNOSIS — M62838 Other muscle spasm: Secondary | ICD-10-CM | POA: Diagnosis not present

## 2013-09-12 DIAGNOSIS — Z23 Encounter for immunization: Secondary | ICD-10-CM

## 2013-09-13 DIAGNOSIS — M6281 Muscle weakness (generalized): Secondary | ICD-10-CM | POA: Diagnosis not present

## 2013-09-13 DIAGNOSIS — R269 Unspecified abnormalities of gait and mobility: Secondary | ICD-10-CM | POA: Diagnosis not present

## 2013-09-14 ENCOUNTER — Telehealth: Payer: Self-pay | Admitting: Neurology

## 2013-09-14 ENCOUNTER — Other Ambulatory Visit: Payer: Self-pay | Admitting: Neurology

## 2013-09-14 DIAGNOSIS — G2581 Restless legs syndrome: Secondary | ICD-10-CM

## 2013-09-18 DIAGNOSIS — R269 Unspecified abnormalities of gait and mobility: Secondary | ICD-10-CM | POA: Diagnosis not present

## 2013-09-18 DIAGNOSIS — M6281 Muscle weakness (generalized): Secondary | ICD-10-CM | POA: Diagnosis not present

## 2013-09-19 ENCOUNTER — Ambulatory Visit (INDEPENDENT_AMBULATORY_CARE_PROVIDER_SITE_OTHER): Payer: BC Managed Care – PPO | Admitting: Surgery

## 2013-09-20 ENCOUNTER — Other Ambulatory Visit: Payer: Self-pay | Admitting: Neurology

## 2013-09-20 MED ORDER — CLONAZEPAM 1 MG PO TABS: ORAL_TABLET | ORAL | Status: DC

## 2013-09-20 NOTE — Telephone Encounter (Signed)
If I am to continue the prescription, i will need a RV or Rv with NP. The patient may have 60 day refill until RV.

## 2013-09-20 NOTE — Telephone Encounter (Signed)
Pt's prescription was faxed over to [facility] at [phone]. °

## 2013-09-24 ENCOUNTER — Encounter (INDEPENDENT_AMBULATORY_CARE_PROVIDER_SITE_OTHER): Payer: BC Managed Care – PPO | Admitting: Surgery

## 2013-09-24 DIAGNOSIS — R269 Unspecified abnormalities of gait and mobility: Secondary | ICD-10-CM | POA: Diagnosis not present

## 2013-09-24 DIAGNOSIS — M6281 Muscle weakness (generalized): Secondary | ICD-10-CM | POA: Diagnosis not present

## 2013-09-27 DIAGNOSIS — R269 Unspecified abnormalities of gait and mobility: Secondary | ICD-10-CM | POA: Diagnosis not present

## 2013-09-27 DIAGNOSIS — M6281 Muscle weakness (generalized): Secondary | ICD-10-CM | POA: Diagnosis not present

## 2013-10-01 DIAGNOSIS — R269 Unspecified abnormalities of gait and mobility: Secondary | ICD-10-CM | POA: Diagnosis not present

## 2013-10-01 DIAGNOSIS — M6281 Muscle weakness (generalized): Secondary | ICD-10-CM | POA: Diagnosis not present

## 2013-10-02 DIAGNOSIS — M6281 Muscle weakness (generalized): Secondary | ICD-10-CM | POA: Diagnosis not present

## 2013-10-02 DIAGNOSIS — R269 Unspecified abnormalities of gait and mobility: Secondary | ICD-10-CM | POA: Diagnosis not present

## 2013-10-03 ENCOUNTER — Ambulatory Visit (INDEPENDENT_AMBULATORY_CARE_PROVIDER_SITE_OTHER): Payer: BC Managed Care – PPO | Admitting: Surgery

## 2013-10-08 DIAGNOSIS — R269 Unspecified abnormalities of gait and mobility: Secondary | ICD-10-CM | POA: Diagnosis not present

## 2013-10-08 DIAGNOSIS — M6281 Muscle weakness (generalized): Secondary | ICD-10-CM | POA: Diagnosis not present

## 2013-10-11 ENCOUNTER — Telehealth: Payer: Self-pay | Admitting: Neurology

## 2013-10-11 DIAGNOSIS — M6281 Muscle weakness (generalized): Secondary | ICD-10-CM | POA: Diagnosis not present

## 2013-10-11 DIAGNOSIS — R269 Unspecified abnormalities of gait and mobility: Secondary | ICD-10-CM | POA: Diagnosis not present

## 2013-10-12 ENCOUNTER — Telehealth: Payer: Self-pay

## 2013-10-12 MED ORDER — FLUTICASONE-SALMETEROL 250-50 MCG/DOSE IN AEPB: 1.0000 | INHALATION_SPRAY | Freq: Two times a day (BID) | RESPIRATORY_TRACT | Status: DC

## 2013-10-12 NOTE — Telephone Encounter (Signed)
Mail order done. 

## 2013-10-15 DIAGNOSIS — R269 Unspecified abnormalities of gait and mobility: Secondary | ICD-10-CM | POA: Diagnosis not present

## 2013-10-15 DIAGNOSIS — M6281 Muscle weakness (generalized): Secondary | ICD-10-CM | POA: Diagnosis not present

## 2013-10-18 DIAGNOSIS — M6281 Muscle weakness (generalized): Secondary | ICD-10-CM | POA: Diagnosis not present

## 2013-10-18 DIAGNOSIS — R269 Unspecified abnormalities of gait and mobility: Secondary | ICD-10-CM | POA: Diagnosis not present

## 2013-10-22 ENCOUNTER — Other Ambulatory Visit: Payer: Self-pay | Admitting: Internal Medicine

## 2013-10-23 ENCOUNTER — Other Ambulatory Visit: Payer: Self-pay

## 2013-10-23 MED ORDER — CARBIDOPA-LEVODOPA 25-100 MG PO TABS: 1.0000 | ORAL_TABLET | Freq: Four times a day (QID) | ORAL | Status: DC

## 2013-10-25 ENCOUNTER — Telehealth: Payer: Self-pay | Admitting: Neurology

## 2013-10-25 DIAGNOSIS — M6281 Muscle weakness (generalized): Secondary | ICD-10-CM | POA: Diagnosis not present

## 2013-10-25 DIAGNOSIS — R269 Unspecified abnormalities of gait and mobility: Secondary | ICD-10-CM | POA: Diagnosis not present

## 2013-10-25 NOTE — Telephone Encounter (Signed)
The patient's wife left a message on 10-23-13 stating she has been trying to contact the office to get a refill on Sinemet.  I see the medication was refilled on 10-23-13.  I left message to patient that med was refilled and sent to their mail order pharmacy.  I told them to call if they needed a small amount called into the [facility] on [address] if the mail order doesn't arrive in time.

## 2013-10-26 ENCOUNTER — Telehealth: Payer: Self-pay | Admitting: Neurology

## 2013-10-29 DIAGNOSIS — R269 Unspecified abnormalities of gait and mobility: Secondary | ICD-10-CM | POA: Diagnosis not present

## 2013-10-29 DIAGNOSIS — M6281 Muscle weakness (generalized): Secondary | ICD-10-CM | POA: Diagnosis not present

## 2013-10-29 NOTE — Telephone Encounter (Signed)
Refills were already sent to [facility] for this medication.  I called the pharmacy.  Spoke with Baxter Hire, she said he has 2 refills on file.  They will fill the Rx today and contact the patient when it's ready for pick up.

## 2013-10-30 DIAGNOSIS — M999 Biomechanical lesion, unspecified: Secondary | ICD-10-CM | POA: Diagnosis not present

## 2013-10-30 DIAGNOSIS — M62838 Other muscle spasm: Secondary | ICD-10-CM | POA: Diagnosis not present

## 2013-10-30 DIAGNOSIS — M5137 Other intervertebral disc degeneration, lumbosacral region: Secondary | ICD-10-CM | POA: Diagnosis not present

## 2013-11-06 DIAGNOSIS — R269 Unspecified abnormalities of gait and mobility: Secondary | ICD-10-CM | POA: Diagnosis not present

## 2013-11-06 DIAGNOSIS — M6281 Muscle weakness (generalized): Secondary | ICD-10-CM | POA: Diagnosis not present

## 2013-11-09 DIAGNOSIS — M6281 Muscle weakness (generalized): Secondary | ICD-10-CM | POA: Diagnosis not present

## 2013-11-09 DIAGNOSIS — R269 Unspecified abnormalities of gait and mobility: Secondary | ICD-10-CM | POA: Diagnosis not present

## 2013-11-13 DIAGNOSIS — M6281 Muscle weakness (generalized): Secondary | ICD-10-CM | POA: Diagnosis not present

## 2013-11-13 DIAGNOSIS — R269 Unspecified abnormalities of gait and mobility: Secondary | ICD-10-CM | POA: Diagnosis not present

## 2013-11-16 DIAGNOSIS — M6281 Muscle weakness (generalized): Secondary | ICD-10-CM | POA: Diagnosis not present

## 2013-11-16 DIAGNOSIS — R269 Unspecified abnormalities of gait and mobility: Secondary | ICD-10-CM | POA: Diagnosis not present

## 2013-11-19 ENCOUNTER — Telehealth: Payer: Self-pay | Admitting: Neurology

## 2013-11-20 ENCOUNTER — Ambulatory Visit: Payer: Medicare Other | Admitting: Neurology

## 2013-11-20 NOTE — Telephone Encounter (Signed)
Spoke to wife and have rescheduled appointment in Feb.  Patient needs CPAP looked and needed patient to be seen much sooner than April, we had to cancel appointment in January.

## 2013-12-04 ENCOUNTER — Other Ambulatory Visit: Payer: Self-pay

## 2013-12-04 DIAGNOSIS — I4891 Unspecified atrial fibrillation: Secondary | ICD-10-CM

## 2013-12-04 MED ORDER — LEVOCETIRIZINE DIHYDROCHLORIDE 5 MG PO TABS: 5.0000 mg | ORAL_TABLET | Freq: Every evening | ORAL | Status: DC

## 2013-12-04 MED ORDER — APIXABAN 5 MG PO TABS: 5.0000 mg | ORAL_TABLET | Freq: Two times a day (BID) | ORAL | Status: DC

## 2013-12-05 DIAGNOSIS — H04129 Dry eye syndrome of unspecified lacrimal gland: Secondary | ICD-10-CM | POA: Diagnosis not present

## 2013-12-12 DIAGNOSIS — M999 Biomechanical lesion, unspecified: Secondary | ICD-10-CM | POA: Diagnosis not present

## 2013-12-12 DIAGNOSIS — M5137 Other intervertebral disc degeneration, lumbosacral region: Secondary | ICD-10-CM | POA: Diagnosis not present

## 2013-12-12 DIAGNOSIS — M62838 Other muscle spasm: Secondary | ICD-10-CM | POA: Diagnosis not present

## 2013-12-19 ENCOUNTER — Ambulatory Visit (INDEPENDENT_AMBULATORY_CARE_PROVIDER_SITE_OTHER): Payer: Medicare Other | Admitting: Neurology

## 2013-12-19 ENCOUNTER — Encounter: Payer: Self-pay | Admitting: Neurology

## 2013-12-19 VITALS — BP 127/76 | HR 55 | Resp 18 | Wt 221.0 lb

## 2013-12-19 DIAGNOSIS — Z9989 Dependence on other enabling machines and devices: Principal | ICD-10-CM

## 2013-12-19 DIAGNOSIS — G2581 Restless legs syndrome: Secondary | ICD-10-CM

## 2013-12-19 DIAGNOSIS — G238 Other specified degenerative diseases of basal ganglia: Secondary | ICD-10-CM

## 2013-12-19 DIAGNOSIS — G4733 Obstructive sleep apnea (adult) (pediatric): Secondary | ICD-10-CM

## 2013-12-19 DIAGNOSIS — I951 Orthostatic hypotension: Secondary | ICD-10-CM

## 2013-12-19 DIAGNOSIS — E860 Dehydration: Secondary | ICD-10-CM

## 2013-12-19 DIAGNOSIS — G903 Multi-system degeneration of the autonomic nervous system: Secondary | ICD-10-CM

## 2013-12-19 MED ORDER — CLONAZEPAM 1 MG PO TABS: ORAL_TABLET | ORAL | Status: DC

## 2013-12-19 NOTE — Patient Instructions (Signed)
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 It is a progressive disorder of the central and sympathetic nervous systems. The disorder causes a large drop in blood pressure when the patient stands up. This leads to dizziness or momentary blackouts. There are three types of MSA:  Olivopontocerebellar atrophy (OPCA). This mostly affects balance, coordination, and speech.  A parkinsonian form (striatonigral degeneration). This can resemble Parkinson's disease because of slow movement and stiff muscles.  A mixed cerebellar and parkinsonian form. SYMPTOMS In all three forms of MSA, the patient can have dizziness or momentary blackouts. Symptoms that happen early in the course of the disease include:  Dizziness or momentary blackouts.  Constipation. This may be hard to manage.  Impotence in men.  Urinary incontinence. This syndrome may be hard to diagnose in the early stages. For most patients, blood pressure is low when the patient stands up. It is high when the patient lies down. Other symptoms that may develop include:  Impaired speech.  Difficulties with breathing and swallowing.  Inability to sweat. TREATMENT  Orthostatic hypotension in this syndrome is treatable. There is no known cure for the central nervous system degeneration. The general treatment course is aimed at controlling symptoms. These include:  Anti-parkinsonian medicine. L-dopa is an example.  To relieve dizziness, increased salt and fluid in the diet may help.  Medicine to elevate blood pressure is often needed. Salt-retaining steroids are an example. This medicine can cause side effects. It should be carefully monitored by a caregiver.  Alpha-adrenergic medications, non-steroidal anti-inflammatory drugs, and sympathomimetic amines are sometimes used.  Sleeping in a head-up position at night. This reduces morning dizziness.  An artificial feeding  tube or breathing tube may be surgically inserted. It is to help manage swallowing and breathing problems. PROGNOSIS  This syndrome usually results in the patient's death. This happens 7 to 10 years after diagnosis. Common causes of death include:  Aspiration. This is inhaling fluid into the lungs.  Stridor. This causes high-pitched breathing sounds due to airway obstruction.  Cardiac arrest. Document Released: 10/15/2002 Document Revised: 01/17/2012 Document Reviewed: 10/25/2005 [facility] Patient Information 2014 [place].

## 2013-12-19 NOTE — Progress Notes (Signed)
Subjective:    Patient ID: [name] [name] is a 79 y.o. male and seen in follow up;  The was originally a patient of [name] [name] here at [facility] and [place], who  ordered a sleep study in 2008 for the patient.  He was diagnosed with  OSA at an AHI of 25.5/hr.  and titrated to CPAP, and  he has been compliant with the CPAP-  his last download however is from last year  2014.date-3-14 with a average daily  time in therapy of 8 hours and 32 minutes, 93% use of days over over 4 hours duration . The CPAPmachine  he owns is unfortunately  an "escape machine"which doesn't allow residual apneas to be counted or documented.  The patient is yearly  followed in the sleep clinic.   He has a multifactorial gait disorder,  chronic ischemic heart disease and recurrent falls.  He was admitted to hospital in July 2014, but he had another's concerning spell of confusion or delirium for a duration of 3 days , for which [name] [name] had seen the patient in October, in an outpatient setting. His PCP has  not seen him again since  after the spell.  His wife seems to think that he had a delirium  and-/ or dehydration at the time.  On 08-30-13 he was referred by [name] [name] for  Gait therapy/  physical therapy.   He was successful and has continued some of his exercises at home with good progress. Some of his exercise is limited by pain as the  physical therapist reported.  He is also afraid to fall and was hesitant to perform some of the exercises for that reason as well.  [name] [name]  feels that her husband  has not reached quite his cognitive baseline in comparison to prior to "the spell".   Interim history as of August 2014 :  [name] [name] is a very pleasant 79 yo RH gentleman,  who is seen on behalf of [name] [name] who is not in the office today.  He is accompanied by his wife today. He has an underlying complex history including parkinsonism, dementia, anxiety, OSA, restless leg syndrome, hearing loss, hypertension, heart  disease with status post MI and status post CABG, atrial fibrillation, history of syncope, insomnia, history of colon cancer, asthma, spinal stenosis, thyroid disease, arthritis, and has had recurrent falls. He started seeing [name] [name] I believe in 2011 and was most recently seen by our nurse practitioner in February 2014, at which time he was continued on Sinemet 3 times a day and advised to reduce prednisone. His wife called today stating that to him he needed to be seen because of recurrent falls. His appointment was canceled for last week and he was not yet rescheduled for another appointment. He was seen in the emergency room on 05/15/2013 after a fall. He had fallen backwards hitting his head on concrete while trying to get into his car. His wife also reported increasing confusion over the past for 5 days prior to your ED visit. He had a CT head which showed stable atrophy and white matter changes and no acute abnormality. He had chest x-ray which was negative for pneumonia. Laboratory function showed normal electrolytes, normal CBC, normal urinalysis. His wife states, that is legs give out and he has a tendency to fall backwards. He has had PT before and last went in November. PT has helped in the past. She would like a referral for PT again. He  went to PT and speech rehab on [facility] [phone]).  He has RLS, RBD, OSA on CPAP. He has a very erratic sleep wake schedule. He and his wife go to bed late around 2 AM and he wakes up at 10, but goes back to sleep and over the course of the day, he takes at least 2 three-hour naps. He uses CPAP at night, but not for his naps.  C/L is helpful, but he does not usually takes the 3 doses. He often misses the last dose. He has no hallucinations.    His Past Medical History Is Significant For: Past Medical History  Diagnosis Date  . ONYCHOMYCOSIS, TOENAILS 06/27/2008  . HYPERLIPIDEMIA 07/28/2007  . ANXIETY 09/28/2007  . OBSTRUCTIVE SLEEP APNEA 01/01/2008   . RESTLESS LEGS SYNDROME 12/26/2007  . HEARING LOSS 06/27/2008  . HYPERTENSION 07/28/2007  . MYOCARDIAL INFARCTION, HX OF 07/28/2007  . CORONARY ARTERY DISEASE 07/28/2007  . Atrial fibrillation 09/28/2007  . SINUSITIS- ACUTE-NOS 10/10/2007  . ALLERGIC RHINITIS 09/28/2007  . CHOLELITHIASIS 09/28/2007  . BENIGN PROSTATIC HYPERTROPHY 09/28/2007  . CELLULITIS, HAND, LEFT 09/24/2010  . SYNCOPE 07/28/2007  . INSOMNIA-SLEEP DISORDER-UNSPEC 04/03/2009  . HYPERSOMNIA WITH SLEEP APNEA UNSPECIFIED 12/26/2007  . FATIGUE 01/01/2008  . DYSPNEA 04/03/2009  . Wheezing 11/06/2009  . ANGIOEDEMA 11/06/2009  . COLON CANCER, HX OF 07/28/2007  . PROSTATE CANCER, HX OF 07/28/2007  . COLONIC POLYPS, HX OF 07/28/2007  . Long term current use of anticoagulant 12/22/2010  . Asthma   . DEMENTIA   . Spinal stenosis   . History of atrial fibrillation   . Lumbar stenosis 10/30/2011  . CAD (coronary artery disease)     Severe three-vessel coronary disease. LIMA to the LAD patent. SVG to PDA patent. SVG to OM 3 patent. Preserved ejection fraction. 2008.  . Cancer   . Thyroid disease   . Arthritis   . Pneumonia   . 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 Sepsis with acute organ dysfunction 09/21/2011    His Past Surgical History Is Significant For: Past Surgical History  Procedure Laterality Date  . Prostatectomy  2001  . Cardioversion  2007  . Stress cardiolite  02/08/2007  . Schocardiograph  10/07/2006  . Right thyroid lobectomy    . Coronary artery bypass graft  1995    x 3  . Hernia repair  1998  . Colonoscopy  2010    last done 2010- clear  . Cardiac electrophysiology study and ablation    . Thyroidectomy, partial      His Family History Is Significant For: Family History  Problem Relation Age of Onset  . Coronary artery disease Father     before 77 yo  . Coronary artery disease Sister     before 28 yo  . Hypertension Other     His Social History Is Significant  For: History   Social History  . Marital Status: Married    Spouse Name: [name] [name]    Number of Children: 2  . Years of Education: 14   Occupational History  . retired Health visitor    Social History Main Topics  . Smoking status: Never Smoker   . Smokeless tobacco: Never Used  . Alcohol Use: No  . Drug Use: No  . Sexual Activity: None   Other Topics Concern  . None   Social History Narrative   Patient is married ([name] [name]) and lives at home with his wife.   Patient has  7 children.   Patient is retired.   Patient has a Associates degree.   Patient is right-handed.   Patient does not drink any caffeine.    His Allergies Are:  Allergies  Allergen Reactions  . Lisinopril Swelling  :   His Current Medications Are:  Outpatient Encounter Prescriptions as of 12/19/2013  Medication Sig  . albuterol (PROAIR HFA) 108 (90 BASE) MCG/ACT inhaler Inhale 2 puffs into the lungs every 4 (four) hours as needed. For wheezing  . ALPRAZolam (XANAX) 0.5 MG tablet Take 1 tablet (0.5 mg total) by mouth 3 (three) times daily as needed.  Marland Kitchen amLODipine (NORVASC) 5 MG tablet Take 1 tablet (5 mg total) by mouth daily.  Marland Kitchen apixaban (ELIQUIS) 5 MG TABS tablet Take 1 tablet (5 mg total) by mouth 2 (two) times daily.  Marland Kitchen atenolol (TENORMIN) 50 MG tablet TAKE 1 TABLET AT BEDTIME  . buPROPion (WELLBUTRIN XL) 150 MG 24 hr tablet TAKE 1 TABLET AT BEDTIME  . carbidopa-levodopa (SINEMET IR) 25-100 MG per tablet Take 1 tablet by mouth 4 (four) times daily. Take at 10 AM, 2 PM, 6 PM and 10 PM per last OV note  . Cholecalciferol (VITAMIN D3) 1000 UNITS CAPS Take 1 capsule by mouth every morning.   . clonazePAM (KLONOPIN) 1 MG tablet 1 by mouth in the evening  . Coenzyme Q10 (CO Q 10 PO) Take 1 tablet by mouth at bedtime.  Marland Kitchen desloratadine (CLARINEX) 5 MG tablet Take 1 tablet (5 mg total) by mouth daily.  Marland Kitchen docusate sodium (COLACE) 100 MG capsule Take 200 mg by mouth at bedtime.  . donepezil (ARICEPT) 23 MG  TABS tablet TAKE 1 TABLET EVERY MORNING  . FLUoxetine (PROZAC) 40 MG capsule Take 1 capsule (40 mg total) by mouth every morning.  . fluticasone (VERAMYST) 27.5 MCG/SPRAY nasal spray Place 1 spray into the nose 2 (two) times daily.  . Fluticasone-Salmeterol (ADVAIR) 250-50 MCG/DOSE AEPB Inhale 1 puff into the lungs every 12 (twelve) hours. As needed  . guaifenesin (HUMIBID E) 400 MG TABS Take 400 mg by mouth every 4 (four) hours as needed. For chest congestion  . ibuprofen (ADVIL,MOTRIN) 200 MG tablet Take 400 mg by mouth every 6 (six) hours as needed for pain.  Marland Kitchen L-Methylfolate-B12-B6-B2 (CEREFOLIN) 04-08-49-5 MG TABS TAKE 1 TABLET DAILY  . levocetirizine (XYZAL) 5 MG tablet Take 1 tablet (5 mg total) by mouth every evening.  . Multiple Vitamin (MULTIVITAMINS PO) Take 1 tablet by mouth at bedtime.   . Olopatadine HCl (PATADAY) 0.2 % SOLN Apply to eye.  . primidone (MYSOLINE) 50 MG tablet TAKE 1 TABLET (50MG ) 2     TIMES A DAY AS DIRECTED  . solifenacin (VESICARE) 5 MG tablet Take 10 mg by mouth daily.  . VERAMYST 27.5 MCG/SPRAY nasal spray USE 2 SPRAYS NASALLY DAILY  . [DISCONTINUED] Fluticasone-Salmeterol (ADVAIR) 250-50 MCG/DOSE AEPB Inhale 1 puff into the lungs every 12 (twelve) hours.   Review of Systems  Constitutional: Positive for fatigue.  HENT: Positive for hearing loss, rhinorrhea, trouble swallowing and tinnitus.   Respiratory: Positive for shortness of breath and wheezing.        Uses a CPAP  Gastrointestinal: Positive for constipation.  Musculoskeletal: Positive for myalgias and arthralgias.  Allergic/Immunologic: Positive for environmental allergies.  Neurological: Positive for tremors.       Memory loss Restless leg  Hematological: Bruises/bleeds easily.  Psychiatric/Behavioral: Positive for confusion, sleep disturbance and dysphoric mood. The patient is nervous/anxious.  Physical Examination:   Filed Vitals:   12/19/13 1331  BP: 127/76  Pulse: 55  Resp: 18      General Examination: The patient is a very pleasant 79 y.o. male in no acute distress. Facial flushing,  Not shaven, cleanly dressed.     HEENT: Normocephalic, atraumatic, pupils are equal, round and reactive to light and accommodation. Status post cataract surgery,  External gaze to the left is restricted, there is proptosis, and funduscopic exam is normal with sharp disc margins noted. Extraocular tracking shows mild saccadic breakdown without nystagmus noted.  There is limitation to upward  gaze. There is mild decrease in eye blink rate. Hearing is intact. Face is symmetric with mild facial masking and normal facial sensation. There is no lip, neck or jaw tremor. Neck is moderately rigid with intact passive ROM. There are no carotid bruits on auscultation. Oropharynx exam reveals mild mouth dryness. No significant airway crowding is noted. Mallampati is class II. Tongue protrudes centrally and palate elevates symmetrically.   There is no drooling.   Chest: is clear to auscultation without wheezing, rhonchi or crackles noted.  Heart: sounds are regular and normal without murmurs, rubs or gallops noted.   Extremities: There is no pitting edema in the distal lower extremities bilaterally. Pedal pulses are intact. There are no varicose veins and there are scars from vein harvesting.  Skin: is warm and dry .  Age-related changes are noted on the skin.   Musculoskeletal: exam reveals no obvious joint deformities, tenderness, joint swelling or erythema.  Neurologically:  Mental status: The patient is awake and alert, paying good  attention. He is able to partially provide the history.  His wife provides details of the history. He is oriented to: person, place, situation and year. His memory, attention, language and knowledge are mildly impaired. There is a mild degree of bradyphrenia. Speech is moderately hypophonic with no dysarthria noted. Mood is congruent and affect is normal.   Tone  is mildly rigid with absence of cogwheeling. He has left more than right rigor and ROM restriction, partially due to joint degeneration in the left shoulder.   There is overall mild bradykinesia. There is no drift or rebound.  There is a slight intermittent resting tremor in the left thumb only.  Romberg is not tested d/t fall risk.   Reflexes are 1+ in the upper extremities, and tonic or dystonic wrist flexion in the left is noted.  DTR absent in the lower extremities.    Fine motor skills exam: His fine motor skills are more impaired in the left hand and as I cannot explain this a shoulder injury or shoulder arthritis alone.   Cerebellar testing shows no dysmetria or intention tremor on finger to nose testing.   Sensory exam is intact to light touch, pinprick, vibration, temperature sense and proprioception in the upper and lower extremities.   Gait, station and balance: He stands up from the seated position from his rolling walker with seat with moderate difficulty and does need to push up with His hands. He needs no assistance. No veering to one side is noted. He is not noted to lean to the side. Posture is moderately stooped. Stance is wide-based. He walks with decrease in stride length and pace and decreased arm swing on the left. He turns in 5 steps. Tandem walk is not possible. Balance is moderately impaired. He is not able to do a toe or heel stance.     The patient has a  known spinal stenosis that was confirmed by his orthopedic surgeon but I also will find the left over the right spasticity noted as well as the generalized increased muscle tone was regular was suggestive of a MSA , PSP or cortical basal degeneration.   Assessment and Plan:   In summary, [name] [name] is a very pleasant 79 y.o.-year old male with a complicated medical Hx who has parkinsonism, possibly left-sided predominant Parkinson's disease, memory loss, gait disorder, likely multifactorial in nature.  With diabetes  dysthymic and movements in the more rigid tone throughout the left body in conjunction with the generalized parkinsonism would not be explained by the spinal stenosis, also spinal stenosis will contribute to his gait disorder. I think he has slight difficulty with writs and elbow movements, finger flexion and extension, and supination. There is no know brachial plexus Injury that would explain this.  The more parkinsonoid  features with dysautonomia (getting unsteady when first rising from a seated position/  Lightheadedness, sometimes facial flushing, urge incontinance, REM BD )  .  He has a tinnitus off rushing water that he proceed to the right more than the left ear.  The rare blink reflex. I suspect a parkinson plus syndrome,  Perhaps MSA or cortical basilar degeneration. The REM sleep disorder  And upward gaze paresis is also seen with PSP.   I would like to obtain an imaging study to make sure that there is no stroke or other anatomical reason to be found for the left side difference.  I will still suggest that he completes and continues  with physical therapy as he has benefited greatly- and  this would would help him in either or both  condition is/ are  Present   The presumed dehydration episode in November was confirmed by a very concentrated , dark urine- he had forgotten to drink enough he is also concerned about going to the bathroom more often.Marland Kitchen

## 2013-12-21 ENCOUNTER — Encounter: Payer: Self-pay | Admitting: Neurology

## 2014-01-01 ENCOUNTER — Telehealth: Payer: Self-pay | Admitting: Cardiology

## 2014-01-01 ENCOUNTER — Inpatient Hospital Stay: Admission: RE | Admit: 2014-01-01 | Payer: Medicare Other | Source: Ambulatory Visit

## 2014-01-01 NOTE — Telephone Encounter (Signed)
New message    Upcoming surgery please advise on eliquis.

## 2014-01-01 NOTE — Telephone Encounter (Signed)
Needs surgical clearance to have IV sedation to remove remaining 11 teeth and approval to hold Eliquis.

## 2014-01-03 NOTE — Telephone Encounter (Signed)
He can hold his Eliquis as needed for removal of his teeth and restart when the oral surgeon thinks that it is safe.

## 2014-01-04 NOTE — Telephone Encounter (Signed)
Spoke w/[name] [name] in Dr. Jewel Baize office and advised he may stop Eliquis as needed and restart when surgeon thinks it is safe.

## 2014-01-07 ENCOUNTER — Other Ambulatory Visit: Payer: Self-pay | Admitting: Neurology

## 2014-01-07 DIAGNOSIS — M62838 Other muscle spasm: Secondary | ICD-10-CM | POA: Diagnosis not present

## 2014-01-07 DIAGNOSIS — M999 Biomechanical lesion, unspecified: Secondary | ICD-10-CM | POA: Diagnosis not present

## 2014-01-07 DIAGNOSIS — M5137 Other intervertebral disc degeneration, lumbosacral region: Secondary | ICD-10-CM | POA: Diagnosis not present

## 2014-01-08 ENCOUNTER — Ambulatory Visit
Admission: RE | Admit: 2014-01-08 | Discharge: 2014-01-08 | Disposition: A | Discharge status: Home / Self Care | Payer: Medicare Other | Source: Ambulatory Visit | Attending: Neurology | Admitting: Neurology

## 2014-01-08 ENCOUNTER — Telehealth: Payer: Self-pay | Admitting: Neurology

## 2014-01-08 DIAGNOSIS — E860 Dehydration: Secondary | ICD-10-CM

## 2014-01-08 DIAGNOSIS — I951 Orthostatic hypotension: Secondary | ICD-10-CM

## 2014-01-08 DIAGNOSIS — G2581 Restless legs syndrome: Secondary | ICD-10-CM | POA: Diagnosis not present

## 2014-01-08 DIAGNOSIS — Z9989 Dependence on other enabling machines and devices: Secondary | ICD-10-CM

## 2014-01-08 DIAGNOSIS — G238 Other specified degenerative diseases of basal ganglia: Secondary | ICD-10-CM

## 2014-01-08 DIAGNOSIS — G903 Multi-system degeneration of the autonomic nervous system: Secondary | ICD-10-CM

## 2014-01-08 DIAGNOSIS — G4733 Obstructive sleep apnea (adult) (pediatric): Secondary | ICD-10-CM | POA: Diagnosis not present

## 2014-01-08 NOTE — Telephone Encounter (Signed)
I spoke to patient's wife and she was told that the patient was to take one and one half to two klonopin in the evening.  The prescription was written for one tab in the evening, he has been taking one and one half, so he is almost out and the pharmacy won't refill because it its too soon.  She wanted to know if she could get a new Rx.  Also she wanted the doctor to know that the patient had another "spell" last night, where his left side was useless.  EMS came out but it was short lasting, about 2 hours and he was fine when they left.  He is scheduled today for his MRI.

## 2014-01-22 ENCOUNTER — Telehealth: Payer: Self-pay | Admitting: Cardiology

## 2014-01-22 NOTE — Telephone Encounter (Signed)
**Note De-Identified  Obfuscation** [name] [name]/Dr Rehm's office states that they need a note to clear the pt for oral surgery. I faxed her the phone note from 01/01/14 where Dr Percival Spanish gave clearance. I called [name] [name] back and she stated that they received the note and that it was satisfactory. I called the pts wife, [name] [name], and advised her that the pt is cleared from a cardiac stand point to have teeth extracted, she verbalized understanding.

## 2014-01-22 NOTE — Telephone Encounter (Signed)
Follow Up   Pt wife called to give Dr. Alvis Lemmings [phone]

## 2014-01-22 NOTE — Telephone Encounter (Signed)
New Message:  Pt's wife is calling to get clearance for her husband to have some teeth pulled and to hold his elliquis. Pt's wife said she would call back with the number to Dr. Lala Lund office.

## 2014-01-24 ENCOUNTER — Telehealth: Payer: Self-pay | Admitting: Neurology

## 2014-01-24 NOTE — Telephone Encounter (Signed)
Pt calling requesting MRI results. Dr. Brett Fairy pt. Sending to [facility], Dr. Rexene Alberts. Please advise

## 2014-01-24 NOTE — Telephone Encounter (Signed)
Patient's wife calling to request patient's MRI results, states they have not heard anything and it has been 3 weeks. Please call and advise patient.

## 2014-01-24 NOTE — Telephone Encounter (Signed)
Please call patient regarding the recent brain MRI: The brain scan showed a normal structure of the brain and moderate volume loss which we call atrophy. There were changes in the deeper structures of the brain, which we call white matter changes or microvascular changes. These were reported as mild in His case. These are tiny white spots, that occur with time and are seen in a variety of conditions, including with normal aging, chronic hypertension, chronic headaches, especially migraine HAs, chronic diabetes, chronic hyperlipidemia. These are not strokes and no mass or lesion or contrast enhancement was seen which is reassuring. Again, there were no acute findings, such as a stroke, or mass or blood products. No further action is required on this test at this time, other than re-enforcing the importance of good blood pressure control, good cholesterol control, good blood sugar control, and weight management. Please remind patient to keep any upcoming appointments or tests and to call us with any interim questions, concerns, problems or updates.

## 2014-01-25 NOTE — Telephone Encounter (Signed)
Called pt and spoke with pt's wife Diane informing her about the pt's MRI results being normal and to keep any upcoming appts or test that the pt might have. I advised Diane that if the pt has any other problems, questions or concerns to call the office. Pt's wife verbalized understanding.

## 2014-01-31 DIAGNOSIS — M5137 Other intervertebral disc degeneration, lumbosacral region: Secondary | ICD-10-CM | POA: Diagnosis not present

## 2014-01-31 DIAGNOSIS — M62838 Other muscle spasm: Secondary | ICD-10-CM | POA: Diagnosis not present

## 2014-01-31 DIAGNOSIS — M999 Biomechanical lesion, unspecified: Secondary | ICD-10-CM | POA: Diagnosis not present

## 2014-02-11 DIAGNOSIS — M5137 Other intervertebral disc degeneration, lumbosacral region: Secondary | ICD-10-CM | POA: Diagnosis not present

## 2014-02-11 DIAGNOSIS — M62838 Other muscle spasm: Secondary | ICD-10-CM | POA: Diagnosis not present

## 2014-02-11 DIAGNOSIS — M999 Biomechanical lesion, unspecified: Secondary | ICD-10-CM | POA: Diagnosis not present

## 2014-02-13 MED ORDER — CLONAZEPAM 1 MG PO TABS: ORAL_TABLET | ORAL | Status: DC

## 2014-02-13 NOTE — Telephone Encounter (Signed)
Yes, changed prescription to 1.5 nightly . CD

## 2014-02-14 ENCOUNTER — Other Ambulatory Visit: Payer: Self-pay

## 2014-02-14 DIAGNOSIS — G2581 Restless legs syndrome: Secondary | ICD-10-CM

## 2014-02-14 DIAGNOSIS — E860 Dehydration: Secondary | ICD-10-CM

## 2014-02-14 DIAGNOSIS — Z9989 Dependence on other enabling machines and devices: Secondary | ICD-10-CM

## 2014-02-14 DIAGNOSIS — G4733 Obstructive sleep apnea (adult) (pediatric): Secondary | ICD-10-CM

## 2014-02-14 MED ORDER — CLONAZEPAM 1 MG PO TABS: 1.5000 mg | ORAL_TABLET | Freq: Every evening | ORAL | Status: DC

## 2014-02-14 NOTE — Telephone Encounter (Signed)
Previous note says: Larey Seat, MD at 02/13/2014 7:01 PM     Status: Signed        Yes, changed prescription to 1.5 nightly . CD   Rx has been updated.

## 2014-02-14 NOTE — Telephone Encounter (Signed)
Rx signed and faxed.

## 2014-02-18 NOTE — Progress Notes (Signed)
Quick Note:  Spoke to patient's wife and relayed MRI brain results, per Dr. Brett Fairy. Wife relayed that patient fell last night but was able to get himself up. ______

## 2014-02-27 ENCOUNTER — Other Ambulatory Visit (INDEPENDENT_AMBULATORY_CARE_PROVIDER_SITE_OTHER): Payer: Medicare Other

## 2014-02-27 ENCOUNTER — Ambulatory Visit (INDEPENDENT_AMBULATORY_CARE_PROVIDER_SITE_OTHER): Payer: Medicare Other | Admitting: Internal Medicine

## 2014-02-27 ENCOUNTER — Encounter: Payer: Self-pay | Admitting: Internal Medicine

## 2014-02-27 VITALS — BP 136/78 | HR 66 | Temp 98.5°F | Resp 10 | Wt 220.0 lb

## 2014-02-27 DIAGNOSIS — M25519 Pain in unspecified shoulder: Secondary | ICD-10-CM | POA: Diagnosis not present

## 2014-02-27 DIAGNOSIS — Z8546 Personal history of malignant neoplasm of prostate: Secondary | ICD-10-CM

## 2014-02-27 DIAGNOSIS — C61 Malignant neoplasm of prostate: Secondary | ICD-10-CM

## 2014-02-27 DIAGNOSIS — E785 Hyperlipidemia, unspecified: Secondary | ICD-10-CM | POA: Diagnosis not present

## 2014-02-27 DIAGNOSIS — M5137 Other intervertebral disc degeneration, lumbosacral region: Secondary | ICD-10-CM | POA: Diagnosis not present

## 2014-02-27 DIAGNOSIS — G232 Striatonigral degeneration: Secondary | ICD-10-CM

## 2014-02-27 DIAGNOSIS — I4891 Unspecified atrial fibrillation: Secondary | ICD-10-CM

## 2014-02-27 DIAGNOSIS — M999 Biomechanical lesion, unspecified: Secondary | ICD-10-CM | POA: Diagnosis not present

## 2014-02-27 DIAGNOSIS — M62838 Other muscle spasm: Secondary | ICD-10-CM | POA: Diagnosis not present

## 2014-02-27 DIAGNOSIS — I10 Essential (primary) hypertension: Secondary | ICD-10-CM | POA: Diagnosis not present

## 2014-02-27 DIAGNOSIS — J45909 Unspecified asthma, uncomplicated: Secondary | ICD-10-CM

## 2014-02-27 DIAGNOSIS — M25512 Pain in left shoulder: Secondary | ICD-10-CM

## 2014-02-27 HISTORY — DX: Striatonigral degeneration: G23.2

## 2014-02-27 LAB — CBC WITH DIFFERENTIAL/PLATELET
Basophils Absolute: 0 10*3/uL (ref 0.0–0.1)
Basophils Relative: 0.6 % (ref 0.0–3.0)
EOS PCT: 2.5 % (ref 0.0–5.0)
Eosinophils Absolute: 0.2 10*3/uL (ref 0.0–0.7)
HEMATOCRIT: 44.6 % (ref 39.0–52.0)
Hemoglobin: 15.5 g/dL (ref 13.0–17.0)
Lymphocytes Relative: 15.6 % (ref 12.0–46.0)
Lymphs Abs: 1 10*3/uL (ref 0.7–4.0)
MCHC: 34.8 g/dL (ref 30.0–36.0)
MCV: 95.4 fl (ref 78.0–100.0)
MONOS PCT: 8.8 % (ref 3.0–12.0)
Monocytes Absolute: 0.6 10*3/uL (ref 0.1–1.0)
NEUTROS PCT: 72.5 % (ref 43.0–77.0)
Neutro Abs: 4.6 10*3/uL (ref 1.4–7.7)
Platelets: 207 10*3/uL (ref 150.0–400.0)
RBC: 4.67 Mil/uL (ref 4.22–5.81)
RDW: 14.1 % (ref 11.5–14.6)
WBC: 6.3 10*3/uL (ref 4.5–10.5)

## 2014-02-27 LAB — HEPATIC FUNCTION PANEL
ALT: 9 U/L (ref 0–53)
AST: 27 U/L (ref 0–37)
Albumin: 3.8 g/dL (ref 3.5–5.2)
Alkaline Phosphatase: 73 U/L (ref 39–117)
BILIRUBIN DIRECT: 0.1 mg/dL (ref 0.0–0.3)
BILIRUBIN TOTAL: 1.3 mg/dL — AB (ref 0.3–1.2)
Total Protein: 6.3 g/dL (ref 6.0–8.3)

## 2014-02-27 LAB — BASIC METABOLIC PANEL
BUN: 10 mg/dL (ref 6–23)
CALCIUM: 9.4 mg/dL (ref 8.4–10.5)
CO2: 31 mEq/L (ref 19–32)
Chloride: 102 mEq/L (ref 96–112)
Creatinine, Ser: 0.7 mg/dL (ref 0.4–1.5)
GFR: 120.15 mL/min (ref >60.00)
GLUCOSE: 87 mg/dL (ref 70–99)
Potassium: 4 mEq/L (ref 3.5–5.1)
Sodium: 140 mEq/L (ref 135–145)

## 2014-02-27 LAB — PSA: PSA: 0.02 ng/mL — ABNORMAL LOW (ref 0.10–4.00)

## 2014-02-27 LAB — LIPID PANEL
Cholesterol: 180 mg/dL (ref 0–200)
HDL: 34.7 mg/dL — ABNORMAL LOW (ref >39.00)
LDL Cholesterol: 48 mg/dL (ref 0–99)
Total CHOL/HDL Ratio: 5
Triglycerides: 487 mg/dL — ABNORMAL HIGH (ref 0.0–149.0)
VLDL: 97.4 mg/dL — AB (ref 0.0–40.0)

## 2014-02-27 LAB — TSH: TSH: 2.57 u[IU]/mL (ref 0.35–5.50)

## 2014-02-27 NOTE — Patient Instructions (Addendum)
Please continue all other medications as before, and refills have been done if requested. Please have the pharmacy call with any other refills you may need.  Please continue your efforts at being more active, low cholesterol diet, and weight control. You are otherwise up to date with prevention measures today.  Please go to the LAB in the Basement (turn left off the elevator) for the tests to be done today  You will be contacted by phone if any changes need to be made immediately.  Otherwise, you will receive a letter about your results with an explanation, but please check with MyChart first  You will be contacted regarding the referral for: Dr Tamala Julian for the left shoulder  Please remember to sign up for MyChart if you have not done so, as this will be important to you in the future with finding out test results, communicating by private email, and scheduling acute appointments online when needed.  Please keep your appointments with your specialists as you have planned - Dr Percival Spanish  Please return in 6 months, or sooner if needed

## 2014-02-27 NOTE — Progress Notes (Signed)
Subjective:    Patient ID: [name] [name], male    DOB: [birth year], 79 y.o.   MRN: [id number]  HPI Here to f/u; overall doing ok,  Pt denies chest pain, increased sob or doe, wheezing, orthopnea, PND, increased LE swelling, palpitations, dizziness or syncope.  Pt denies polydipsia, polyuria, or low sugar symptoms such as weakness or confusion improved with po intake.  Pt denies new neurological symptoms such as new headache, or facial or extremity weakness or numbness.   Pt states overall good compliance with meds, has been trying to follow lower cholesterol diet, with wt overall stable, Has seen neurology recently with dementia, parkinsons  Has new dentures.   Does have several wks ongoing nasal allergy symptoms with clearish congestion, itch and sneezing, without fever, pain, ST, cough, swelling or wheezing. Due for card f/u.  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 2 falls in last 3 mo. Has ongoing left shoulder pain secondary to severe DJD, pain 8/10, takes advil prn.  Last seen per ortho "some time ago."  Has known lumbar spinal stenosis approx 5 yrs.  Has seen Dr Dot Lanes, but willing to see Dr Tamala Julian in our office as well.   Past Medical History  Diagnosis Date  . ONYCHOMYCOSIS, TOENAILS 06/27/2008  . HYPERLIPIDEMIA 07/28/2007  . ANXIETY 09/28/2007  . OBSTRUCTIVE SLEEP APNEA 01/01/2008  . RESTLESS LEGS SYNDROME 12/26/2007  . HEARING LOSS 06/27/2008  . HYPERTENSION 07/28/2007  . MYOCARDIAL INFARCTION, HX OF 07/28/2007  . CORONARY ARTERY DISEASE 07/28/2007  . Atrial fibrillation 09/28/2007  . SINUSITIS- ACUTE-NOS 10/10/2007  . ALLERGIC RHINITIS 09/28/2007  . CHOLELITHIASIS 09/28/2007  . BENIGN PROSTATIC HYPERTROPHY 09/28/2007  . CELLULITIS, HAND, LEFT 09/24/2010  . SYNCOPE 07/28/2007  . INSOMNIA-SLEEP DISORDER-UNSPEC 04/03/2009  . HYPERSOMNIA WITH SLEEP APNEA UNSPECIFIED 12/26/2007  . FATIGUE 01/01/2008  . DYSPNEA 04/03/2009  . Wheezing 11/06/2009  . ANGIOEDEMA 11/06/2009  . COLON CANCER, HX OF 07/28/2007    . PROSTATE CANCER, HX OF 07/28/2007  . COLONIC POLYPS, HX OF 07/28/2007  . Long term current use of anticoagulant 12/22/2010  . Asthma   . DEMENTIA   . Spinal stenosis   . History of atrial fibrillation   . Lumbar stenosis 10/30/2011  . CAD (coronary artery disease)     Severe three-vessel coronary disease. LIMA to the LAD patent. SVG to PDA patent. SVG to OM 3 patent. Preserved ejection fraction. 2008.  . Cancer   . Thyroid disease   . Arthritis   . Pneumonia   . 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 Sepsis with acute organ dysfunction 09/21/2011  . 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 Prostatectomy  2001  . Cardioversion  2007  . Stress cardiolite  02/08/2007  . Schocardiograph  10/07/2006  . Right thyroid lobectomy    . Coronary artery bypass graft  1995    x 3  . Hernia repair  1998  . Colonoscopy  2010    last done 2010- clear  . Cardiac electrophysiology study and ablation    . Thyroidectomy, partial      reports that he has never smoked. He has never used smokeless tobacco. He reports that he does not drink alcohol or use illicit drugs. family history includes Coronary artery disease in his father and sister; Hypertension in his other. Allergies  Allergen Reactions  . Lisinopril Swelling  . Statins  parkinsons    Current Outpatient Prescriptions on File Prior to Visit  Medication Sig Dispense Refill  . albuterol (PROAIR HFA) 108 (90 BASE) MCG/ACT inhaler Inhale 2 puffs into the lungs every 4 (four) hours as needed. For wheezing  3 Inhaler  3  . amLODipine (NORVASC) 5 MG tablet Take 1 tablet (5 mg total) by mouth daily.  90 tablet  3  . apixaban (ELIQUIS) 5 MG TABS tablet Take 1 tablet (5 mg total) by mouth 2 (two) times daily.  180 tablet  3  . atenolol (TENORMIN) 50 MG tablet TAKE 1 TABLET AT BEDTIME  90 tablet  3  . buPROPion  (WELLBUTRIN XL) 150 MG 24 hr tablet TAKE 1 TABLET AT BEDTIME  90 tablet  3  . carbidopa-levodopa (SINEMET IR) 25-100 MG per tablet Take 1 tablet by mouth 4 (four) times daily. Take at 10 AM, 2 PM, 6 PM and 10 PM per last OV note  360 tablet  1  . Coenzyme Q10 (CO Q 10 PO) Take 1 tablet by mouth at bedtime.      Marland Kitchen desloratadine (CLARINEX) 5 MG tablet Take 1 tablet (5 mg total) by mouth daily.  90 tablet  3  . docusate sodium (COLACE) 100 MG capsule Take 200 mg by mouth at bedtime.      . donepezil (ARICEPT) 23 MG TABS tablet TAKE 1 TABLET EVERY MORNING  90 tablet  1  . FLUoxetine (PROZAC) 40 MG capsule Take 1 capsule (40 mg total) by mouth every morning.  90 capsule  3  . fluticasone (VERAMYST) 27.5 MCG/SPRAY nasal spray Place 1 spray into the nose 2 (two) times daily.      . Fluticasone-Salmeterol (ADVAIR) 250-50 MCG/DOSE AEPB Inhale 1 puff into the lungs every 12 (twelve) hours. As needed      . guaifenesin (HUMIBID E) 400 MG TABS Take 400 mg by mouth every 4 (four) hours as needed. For chest congestion      . ibuprofen (ADVIL,MOTRIN) 200 MG tablet Take 400 mg by mouth every 6 (six) hours as needed for pain.      Marland Kitchen L-Methylfolate-B12-B6-B2 (CEREFOLIN) 04-08-49-5 MG TABS TAKE 1 TABLET DAILY  90 each  3  . levocetirizine (XYZAL) 5 MG tablet Take 1 tablet (5 mg total) by mouth every evening.  90 tablet  3  . Multiple Vitamin (MULTIVITAMINS PO) Take 1 tablet by mouth at bedtime.       . Olopatadine HCl (PATADAY) 0.2 % SOLN Apply to eye.      . primidone (MYSOLINE) 50 MG tablet TAKE 1 TABLET (50MG ) 2     TIMES A DAY AS DIRECTED  180 tablet  3  . solifenacin (VESICARE) 5 MG tablet Take 10 mg by mouth daily.      . VERAMYST 27.5 MCG/SPRAY nasal spray USE 2 SPRAYS NASALLY DAILY  30 g  11   No current facility-administered medications on file prior to visit.    Review of Systems  Constitutional: Negative for unusual diaphoresis or other sweats  HENT: Negative for ringing in ear Eyes: Negative for  double vision or worsening visual disturbance.  Respiratory: Negative for choking and stridor.   Gastrointestinal: Negative for vomiting or other signifcant bowel change Genitourinary: Negative for hematuria or decreased urine volume.  Musculoskeletal: Negative for other MSK pain or swelling Skin: Negative for color change and worsening wound.  Neurological: Negative for tremors and numbness other than noted  Psychiatric/Behavioral: Negative for decreased concentration or agitation other than above  Objective:   Physical Exam        Assessment & Plan:

## 2014-02-28 ENCOUNTER — Encounter: Payer: Self-pay | Admitting: Neurology

## 2014-02-28 ENCOUNTER — Ambulatory Visit (INDEPENDENT_AMBULATORY_CARE_PROVIDER_SITE_OTHER): Payer: Medicare Other | Admitting: Neurology

## 2014-02-28 VITALS — BP 125/71 | HR 55 | Resp 18 | Wt 220.0 lb

## 2014-02-28 DIAGNOSIS — G4733 Obstructive sleep apnea (adult) (pediatric): Secondary | ICD-10-CM

## 2014-02-28 DIAGNOSIS — Z9181 History of falling: Secondary | ICD-10-CM

## 2014-02-28 DIAGNOSIS — F068 Other specified mental disorders due to known physiological condition: Secondary | ICD-10-CM

## 2014-02-28 DIAGNOSIS — G238 Other specified degenerative diseases of basal ganglia: Secondary | ICD-10-CM | POA: Diagnosis not present

## 2014-02-28 DIAGNOSIS — F0390 Unspecified dementia without behavioral disturbance: Secondary | ICD-10-CM | POA: Insufficient documentation

## 2014-02-28 DIAGNOSIS — Z9989 Dependence on other enabling machines and devices: Secondary | ICD-10-CM

## 2014-02-28 DIAGNOSIS — R296 Repeated falls: Secondary | ICD-10-CM | POA: Insufficient documentation

## 2014-02-28 HISTORY — DX: Other specified degenerative diseases of basal ganglia: G23.8

## 2014-02-28 NOTE — Patient Instructions (Signed)
Referral to Dr. Janann Colonel, and request to Dr. Percival Spanish to see if patient can reduced Norvasc.

## 2014-02-28 NOTE — Progress Notes (Signed)
Subjective:    Patient ID: [name] [name] is a 79 y.o. male and seen in follow up;  The was originally a patient of Dr. Iona [name] here at [facility] and [place], who  ordered a sleep study in 2008 for the patient.  He was diagnosed with  OSA at an AHI of 25.5/hr.  and titrated to CPAP, and  he has been compliant with the CPAP-  his last download however is from last year  2014.date-3-14 with a average daily  time in therapy of 8 hours and 32 minutes, 93% use of days over over 4 hours duration . The CPAPmachine  he owns is unfortunately  an "escape machine"which doesn't allow residual apneas to be counted or documented.  The patient is yearly  followed in the sleep clinic.   He has a multifactorial gait disorder,  chronic ischemic heart disease and recurrent falls.  He was admitted to hospital in July 2014, but he had another's concerning spell of confusion or delirium for a duration of 3 days , for which Dr. Jenny [name] had seen the patient in October, in an outpatient setting. His PCP has  not seen him again since  after the spell.  His wife seems to think that he had a delirium  and-/ or dehydration at the time.  On 08-30-13 he was referred by Dr. Cathlean [name] for  Gait therapy/  physical therapy.   He was successful and has continued some of his exercises at home with good progress. Some of his exercise is limited by pain as the  physical therapist reported.  He is also afraid to fall and was hesitant to perform some of the exercises for that reason as well.  [name] [name]  feels that her husband  has not reached quite his cognitive baseline in comparison to prior to "the spell".   Interim history as of August 2014 :  [name] [name] is a very pleasant 79 yo RH gentleman,  who is seen on behalf of Dr. Brett [name] who is not in the office today.  He is accompanied by his wife today. He has an underlying complex history including parkinsonism, dementia, anxiety, OSA, restless leg syndrome, hearing loss, hypertension, heart  disease with status post MI and status post CABG, atrial fibrillation, history of syncope, insomnia, history of colon cancer, asthma, spinal stenosis, thyroid disease, arthritis, and has had recurrent falls. He started seeing Dr. Brett [name] I believe in 2011 and was most recently seen by our nurse practitioner in February 2014, at which time he was continued on Sinemet 3 times a day and advised to reduce prednisone. His wife called today stating that to him he needed to be seen because of recurrent falls. His appointment was canceled for last week and he was not yet rescheduled for another appointment. He was seen in the emergency room on 05/15/2013 after a fall. He had fallen backwards hitting his head on concrete while trying to get into his car. His wife also reported increasing confusion over the past for 5 days prior to your ED visit. He had a CT head which showed stable atrophy and white matter changes and no acute abnormality. He had chest x-ray which was negative for pneumonia. Laboratory function showed normal electrolytes, normal CBC, normal urinalysis. His wife states, that is legs give out and he has a tendency to fall backwards. He has had PT before and last went in November. PT has helped in the past. She would like a referral for PT again. He  went to PT and speech rehab on [facility] [phone]).  He has RLS, RBD, OSA on CPAP. He has a very erratic sleep wake schedule. He and his wife go to bed late around 2 AM and he wakes up at 10, but goes back to sleep and over the course of the day, he takes at least 2 three-hour naps. He uses CPAP at night, but not for his naps.  C/L is helpful, but he does not usually takes the 3 doses. He often misses the last dose. He has no hallucinations.    His Past Medical History Is Significant For: Past Medical History  Diagnosis Date  . ONYCHOMYCOSIS, TOENAILS 06/27/2008  . HYPERLIPIDEMIA 07/28/2007  . ANXIETY 09/28/2007  . OBSTRUCTIVE SLEEP APNEA 01/01/2008   . RESTLESS LEGS SYNDROME 12/26/2007  . HEARING LOSS 06/27/2008  . HYPERTENSION 07/28/2007  . MYOCARDIAL INFARCTION, HX OF 07/28/2007  . CORONARY ARTERY DISEASE 07/28/2007  . Atrial fibrillation 09/28/2007  . SINUSITIS- ACUTE-NOS 10/10/2007  . ALLERGIC RHINITIS 09/28/2007  . CHOLELITHIASIS 09/28/2007  . BENIGN PROSTATIC HYPERTROPHY 09/28/2007  . CELLULITIS, HAND, LEFT 09/24/2010  . SYNCOPE 07/28/2007  . INSOMNIA-SLEEP DISORDER-UNSPEC 04/03/2009  . HYPERSOMNIA WITH SLEEP APNEA UNSPECIFIED 12/26/2007  . FATIGUE 01/01/2008  . DYSPNEA 04/03/2009  . Wheezing 11/06/2009  . ANGIOEDEMA 11/06/2009  . COLON CANCER, HX OF 07/28/2007  . PROSTATE CANCER, HX OF 07/28/2007  . COLONIC POLYPS, HX OF 07/28/2007  . Long term current use of anticoagulant 12/22/2010  . Asthma   . DEMENTIA   . Spinal stenosis   . History of atrial fibrillation   . Lumbar stenosis 10/30/2011  . CAD (coronary artery disease)     Severe three-vessel coronary disease. LIMA to the LAD patent. SVG to PDA patent. SVG to OM 3 patent. Preserved ejection fraction. 2008.  . Cancer   . Thyroid disease   . Arthritis   . Pneumonia   . 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 Sepsis with acute organ dysfunction 09/21/2011  . 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 Prostatectomy  2001  . Cardioversion  2007  . Stress cardiolite  02/08/2007  . Schocardiograph  10/07/2006  . Right thyroid lobectomy    . Coronary artery bypass graft  1995    x 3  . Hernia repair  1998  . Colonoscopy  2010    last done 2010- clear  . Cardiac electrophysiology study and ablation    . Thyroidectomy, partial      His Family History Is Significant For: Family History  Problem Relation Age of Onset  . Coronary artery disease Father     before 40 yo  . Coronary artery disease Sister      before 16 yo  . Hypertension Other     His Social History Is Significant For: History   Social History  . Marital Status: Married    Spouse Name: Diane    Number of Children: 2  . Years of Education: 14   Occupational History  . retired Health visitor    Social History Main Topics  . Smoking status: Never Smoker   . Smokeless tobacco: Never Used  . Alcohol Use: No  . Drug Use: No  . Sexual Activity: None   Other Topics Concern  . None   Social History Narrative  Patient is married (Diane) and lives at home with his wife.   Patient has 7 children.   Patient is retired.   Patient has a Associates degree.   Patient is right-handed.   Patient does not drink any caffeine.    His Allergies Are:  Allergies  Allergen Reactions  . Lisinopril Swelling  . Statins     parkinsons   :   His Current Medications Are:  Outpatient Encounter Prescriptions as of 02/28/2014  Medication Sig  . albuterol (PROAIR HFA) 108 (90 BASE) MCG/ACT inhaler Inhale 2 puffs into the lungs every 4 (four) hours as needed. For wheezing  . ALPRAZolam (XANAX) 0.5 MG tablet Take 0.5 mg by mouth. As needed  . amLODipine (NORVASC) 5 MG tablet Take 1 tablet (5 mg total) by mouth daily.  Marland Kitchen apixaban (ELIQUIS) 5 MG TABS tablet Take 1 tablet (5 mg total) by mouth 2 (two) times daily.  Marland Kitchen atenolol (TENORMIN) 50 MG tablet TAKE 1 TABLET AT BEDTIME  . buPROPion (WELLBUTRIN XL) 150 MG 24 hr tablet TAKE 1 TABLET AT BEDTIME  . carbidopa-levodopa (SINEMET IR) 25-100 MG per tablet Take 1 tablet by mouth 4 (four) times daily. Take at 10 AM, 2 PM, 6 PM and 10 PM per last OV note  . clonazePAM (KLONOPIN) 1 MG tablet Take 1.5 mg by mouth every evening. Taking 1/2 tablet more ,  if having difficulty sleeping.  . Coenzyme Q10 (CO Q 10 PO) Take 1 tablet by mouth at bedtime.  Marland Kitchen desloratadine (CLARINEX) 5 MG tablet Take 1 tablet (5 mg total) by mouth daily.  Marland Kitchen docusate sodium (COLACE) 100 MG capsule Take 200 mg  by mouth at bedtime.  . donepezil (ARICEPT) 23 MG TABS tablet TAKE 1 TABLET EVERY MORNING  . FLUoxetine (PROZAC) 40 MG capsule Take 1 capsule (40 mg total) by mouth every morning.  . fluticasone (VERAMYST) 27.5 MCG/SPRAY nasal spray Place 1 spray into the nose 2 (two) times daily.  . Fluticasone-Salmeterol (ADVAIR) 250-50 MCG/DOSE AEPB Inhale 1 puff into the lungs every 12 (twelve) hours. As needed  . guaifenesin (HUMIBID E) 400 MG TABS Take 400 mg by mouth every 4 (four) hours as needed. For chest congestion  . ibuprofen (ADVIL,MOTRIN) 200 MG tablet Take 400 mg by mouth every 6 (six) hours as needed for pain.  Marland Kitchen L-Methylfolate-B12-B6-B2 (CEREFOLIN) 04-08-49-5 MG TABS TAKE 1 TABLET DAILY  . levocetirizine (XYZAL) 5 MG tablet Take 1 tablet (5 mg total) by mouth every evening.  . Multiple Vitamin (MULTIVITAMINS PO) Take 1 tablet by mouth at bedtime.   . Olopatadine HCl (PATADAY) 0.2 % SOLN Apply to eye.  . primidone (MYSOLINE) 50 MG tablet TAKE 1 TABLET (50MG ) 2     TIMES A DAY AS DIRECTED  . solifenacin (VESICARE) 5 MG tablet Take 10 mg by mouth daily.  . VERAMYST 27.5 MCG/SPRAY nasal spray USE 2 SPRAYS NASALLY DAILY  . [DISCONTINUED] ALPRAZolam (XANAX) 0.5 MG tablet Take 1 tablet (0.5 mg total) by mouth 3 (three) times daily as needed.  . [DISCONTINUED] clonazePAM (KLONOPIN) 1 MG tablet Take 1.5 tablets (1.5 mg total) by mouth every evening.   Review of Systems  Constitutional: Positive for fatigue.  HENT: Positive for hearing loss, rhinorrhea, trouble swallowing and tinnitus.   Respiratory: Positive for shortness of breath and wheezing.        Uses a CPAP  Gastrointestinal: Positive for constipation.  Musculoskeletal: Positive for myalgias and arthralgias.  Allergic/Immunologic: Positive for environmental allergies.  Neurological: Positive for  tremors.       Memory loss Restless leg  Hematological: Bruises/bleeds easily.  Psychiatric/Behavioral: Positive for confusion, sleep disturbance  and dysphoric mood. The patient is nervous/anxious.      Physical Examination:   Filed Vitals:   02/28/14 1502  BP: 125/71  Pulse: 55  Resp: 18     General Examination: The patient is a very pleasant 79 y.o. male in no acute distress. Facial flushing,  Not shaven, cleanly dressed.     HEENT: Normocephalic, atraumatic, pupils are equal, round and reactive to light and accommodation. Status post cataract surgery,  External gaze to the left is restricted, there is proptosis, and funduscopic exam is normal with sharp disc margins noted. Extraocular tracking shows mild saccadic breakdown without nystagmus noted.  There is limitation to upward  gaze. There is mild decrease in eye blink rate. Hearing is intact. Face is symmetric with mild facial masking and normal facial sensation. There is no lip, neck or jaw tremor. Neck is moderately rigid with intact passive ROM. There are no carotid bruits on auscultation. Oropharynx exam reveals mild mouth dryness. No significant airway crowding is noted. Mallampati is class II. Tongue protrudes centrally and palate elevates symmetrically.   There is no drooling.   Chest: is clear to auscultation without wheezing, rhonchi or crackles noted.  Heart: sounds are regular and normal without murmurs, rubs or gallops noted.   Extremities: There is no pitting edema in the distal lower extremities bilaterally. Pedal pulses are intact. There are no varicose veins and there are scars from vein harvesting.  Skin: is warm and dry .  Age-related changes are noted on the skin.   Musculoskeletal: exam reveals no obvious joint deformities, tenderness, joint swelling or erythema.  Neurologically:  Mental status: The patient is awake and alert, paying good  attention. He is able to partially provide the history.  His wife provides details of the history. He is oriented to: person, place, situation and year.  His memory, attention, language and knowledge are mildly  impaired. There is a mild degree of bradyphrenia. There is hypomimia, Speech is moderately hypophonic with no dysarthria noted. Mood is congruent and affect is normal.    MOTOR: Tone is rigid with absence of cogwheeling, worse on the left . He has left more than right rigor and ROM restriction, partially due to joint degeneration in the left shoulder.   There is overall mild bradykinesia. There is no drift or rebound.  There is a clearly higher amplitude to his  intermittent resting tremor in the right thumb, Pill -rolling tremor.  He has Tremor in wrists and all 10 fingers.  .   The Romberg is not tested d/t fall risk.   Reflexes are 1+ in the upper extremities, and tonic or dystonic wrist flexion in the left is noted.   DTR absent in the lower extremities.   The patient has a delay in his motor function, when asked to extend his legs he has to concentrate on each leg singularly to do the requested move. It takes him a long time to process the droplet formation and translated into a motion -BRADYKINESIA.Marland Kitchen  Fine motor skills exam: His fine motor skills are  Impaired. Unable to hold a pen, but preserved grip strenght.   Tremor visible in left hand - I cannot explain this a shoulder injury or shoulder arthritis alone.   Cerebellar testing shows no dysmetria / intention tremor on finger to nose testing.   Sensory exam is intact  to light touch, pinprick, vibration, temperature sense and proprioception in the upper and lower extremities.   Gait, station and balance: He stands up from the seated position  with  assistence only and w on the third attempt. He needs to push up with his hands. Truncal rigidity.   Now , veering to one side is noted. Spasticity was again noted as well as the generalized increased muscle tone suggestive of a MSA , PSP or cortical basal degeneration.   Assessment and Plan:  My last note note :  In summary, [name] [name] is a very pleasant 79 y.o.-year old male with a  complicated medical Hx who has parkinsonism, possibly left-sided predominant Parkinson's disease, memory loss, gait disorder, likely multifactorial in nature.  With diabetes,  dysthymia and movements with rigid tone throughout the left body in conjunction with the generalized parkinsonism would not be explained by the spinal stenosis, also spinal stenosis will contribute to his gait disorder.   I think he has slight difficulty with writing and elbow movements, finger flexion and extension, and supination. There is no know brachial plexus Injury that would explain this.   The more parkinsonoid  features with dysautonomia (getting unsteady when first rising from a seated position/ lightheadedness, sometimes facial flushing, urge incontinance, REM BD )  .  He has a tinnitus off rushing water that he proceed to the right more than the left ear.  The rare blink reflex. 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     MRI  01-08-14 ruled out a corticobasal degeneration, vascular parkinson, stroke.   It confirmed a sylvian atrophy, ruled out NPH.  I will still suggest that he completes and continues with physical therapy as he has benefited greatly- and  this would would help him .   The presumed dehydration episode in November was confirmed by a very concentrated , dark urine- he had forgotten to drink enough he is also concerned about going to the bathroom more often. Soon after my last visit with the patient the day prior to his MRI on 01-08-14 he fell down, at home,  He had suddenly lost control over his left side, he found that his feet and able to be moved. He fell in the process of transferring to a chair. His wife reports falls in addition to his is one that seemed to be more like drop attacks.  The patient does have dysautonomia, lightheadedness and  dizziness when standing up. He needs to take a deep breath before initiating  gait .  I would like Dr Percival Spanish to evaluate of the patient can discontinue  Norvasc and stay on beta blocker for rate control in a fib.  This may help with hypotension episodes.     I initiated a referral to a movement disorder specialist ,  He prefers Dr Janann Colonel.

## 2014-03-01 ENCOUNTER — Telehealth: Payer: Self-pay | Admitting: Neurology

## 2014-03-01 NOTE — Telephone Encounter (Signed)
Left message for patient to call back and schedule 4 month follow up for second opinion with Dr. Janann Colonel per Dr. Brett Fairy (around 06/30/14).

## 2014-03-02 DIAGNOSIS — M25512 Pain in left shoulder: Secondary | ICD-10-CM | POA: Insufficient documentation

## 2014-03-02 NOTE — Assessment & Plan Note (Addendum)
stable overall by history and exam, recent data reviewed with pt, and pt to continue medical treatment as before,  to f/u any worsening symptoms or concerns BP Readings from Last 3 Encounters:  02/28/14 125/71  02/27/14 136/78  12/19/13 127/76   Note:  Total time for pt hx, exam, review of record with pt in the room, determination of diagnoses and plan for further eval and tx is > 40 min, with over 50% spent in coordination and counseling of patient

## 2014-03-02 NOTE — Assessment & Plan Note (Signed)
Refer Dr Tamala Julian, sport med

## 2014-03-02 NOTE — Assessment & Plan Note (Signed)
stable overall by history and exam, recent data reviewed with pt, and pt to continue medical treatment as before,  to f/u any worsening symptoms or concerns SpO2 Readings from Last 3 Encounters:  02/27/14 95%  09/03/13 93%  08/29/13 94%

## 2014-03-02 NOTE — Assessment & Plan Note (Signed)
For psa f/u per wife request

## 2014-03-02 NOTE — Assessment & Plan Note (Signed)
Due for card f/u - wife states will call to make appt

## 2014-03-02 NOTE — Assessment & Plan Note (Signed)
stable overall by history and exam, recent data reviewed with pt, and pt to continue medical treatment as before,  to f/u any worsening symptoms or concerns Lab Results  Component Value Date   LDLCALC 48 02/27/2014

## 2014-03-05 ENCOUNTER — Other Ambulatory Visit: Payer: Self-pay | Admitting: Internal Medicine

## 2014-03-13 ENCOUNTER — Ambulatory Visit (INDEPENDENT_AMBULATORY_CARE_PROVIDER_SITE_OTHER): Payer: Medicare Other | Admitting: Family Medicine

## 2014-03-13 ENCOUNTER — Other Ambulatory Visit (INDEPENDENT_AMBULATORY_CARE_PROVIDER_SITE_OTHER): Payer: Medicare Other

## 2014-03-13 ENCOUNTER — Encounter: Payer: Self-pay | Admitting: Family Medicine

## 2014-03-13 VITALS — BP 134/82 | HR 60

## 2014-03-13 DIAGNOSIS — M19019 Primary osteoarthritis, unspecified shoulder: Secondary | ICD-10-CM | POA: Diagnosis not present

## 2014-03-13 DIAGNOSIS — M25519 Pain in unspecified shoulder: Secondary | ICD-10-CM

## 2014-03-13 DIAGNOSIS — M19012 Primary osteoarthritis, left shoulder: Secondary | ICD-10-CM | POA: Insufficient documentation

## 2014-03-13 DIAGNOSIS — M25512 Pain in left shoulder: Secondary | ICD-10-CM

## 2014-03-13 NOTE — Assessment & Plan Note (Signed)
Patient has severe arthritis of the left shoulder with bone on bone with significant crepitus he can with mild range of motion. Patient did have a steroid injection today with good results. Discuss with patient about expectations. Patient will not be pain free but we're going to try to help with patient's symptoms. We can repeat steroid injections every 2 months if necessary. Patient's encouraged to potentially try a couple over-the-counter medications to be safe with his other medicines. We'll not do any other prescription medications due to his other comorbidities. We discussed icing and how this can be beneficial as well. Patient will followup in 4 weeks for further evaluation.

## 2014-03-13 NOTE — Progress Notes (Signed)
[name] [place], [place] [address] Phone: [phone] Subjective:    I'm seeing this patient by the request  of:  Cathlean Cower, MD   CC: Left shoulder pain  [id number] [name] [name] is a 79 y.o. male coming in with complaint of left shoulder pain. She does have a past medical history significant for Parkinson's disease. Patient needs to use his arms to help in transferring is having difficulty. Patient states if he tries to use his left arm at all he is having some considerable amount of pain. Patient states when he attempts to get out of a chair he has severe pain minimal to make someone to fall down. Patient also states if he moves it the wrong way over tries to lay on it at night he has severe pain as well. Patient did see an orthopedic physician greater than 2 years ago and was told that he had severe arthritis. They said that there was nothing else I could do for it at that time. Denies any radiation down his arm or any numbness. Patient has tried some of his prescription medications without any significant improvement. Patient has not tried ice or heat.     Past medical history, social, surgical and family history all reviewed in electronic medical record.   Review of Systems: No headache, visual changes, nausea, vomiting, diarrhea, constipation, dizziness, abdominal pain, skin rash, fevers, chills, night sweats, weight loss, swollen lymph nodes, body aches, joint swelling, muscle aches, chest pain, shortness of breath, mood changes.   Objective Blood pressure 134/82, pulse 60, SpO2 93.00%.  General: No apparent distress alert and oriented x3 mood and affect normal, dressed appropriately. Masked fasces   HEENT: Pupils equal, extraocular movements intact  Respiratory: Patient's speak in full sentences and does not appear short of breath  Cardiovascular: No lower extremity edema, non tender, no erythema  Skin: Warm dry intact with no signs  of infection or rash on extremities or on axial skeleton.  Abdomen: Soft nontender  Neuro: Cranial nerves II through XII are intact, neurovascularly intact in all extremities with 2+ DTRs and 2+ pulses.  Lymph: No lymphadenopathy of posterior or anterior cervical chain or axillae bilaterally.  Gait wide based gait with shuffling.  MSK:  Non tender but cogwheeling noted but good stability and symmetric strength and tone of  elbows, wrist, hip, knee and ankles bilaterally.   Shoulder: Left Inspection reveals significant atrophy Palpation is generalized tenderness.  ROM severely restricted with significant crepitus. Rotator cuff strength is 2/5 signs of impingement with positive Neer and Hawkin's tests positive empty can sign. Speeds and Yergason's tests normal. Unable to do his exam secondary to pain as well as severe crepitus.  MSK US performed of: Left This study was ordered, performed, and interpreted by Charlann Boxer D.O.  Shoulder:   Supraspinatus:  Completely ruptured and atrophied.  Severe underlying arthritis noted  Glenohumeral Joint:  Severe osteoarthritis Biceps Tendon:  Appears normal on long and transverse views, no fraying of tendon, tendon located in intertubercular groove, no subluxation with shoulder internal or external rotation.  Impression: Severe rotator cuff arthropathy  Procedure: Real-time Ultrasound Guided Injection of Left glenohumeral joint Device: GE Logiq E  Ultrasound guided injection is preferred based studies that show increased duration, increased effect, greater accuracy, decreased procedural pain, increased response rate with ultrasound guided versus blind injection.  Verbal informed consent obtained.  Time-out conducted.  Noted no overlying erythema, induration, or other signs of  local infection.  Skin prepped in a sterile fashion.  Local anesthesia: Topical Ethyl chloride.  With sterile technique and under real time ultrasound guidance:  Joint  visualized.  23g 1  inch needle inserted posterior approach. Pictures taken for needle placement. Patient did have injection of 2 cc of 1% lidocaine, 2 cc of 0.5% Marcaine, and 1.0 cc of Kenalog 40 mg/dL. Completed without difficulty  Pain immediately resolved suggesting accurate placement of the medication.  Advised to call if fevers/chills, erythema, induration, drainage, or persistent bleeding.  Images permanently stored and available for review in the ultrasound unit.  Impression: Technically successful ultrasound guided injection..    Impression and Recommendations:     This case required medical decision making of moderate complexity.

## 2014-03-13 NOTE — Patient Instructions (Signed)
Good to meet you both Bad arthritis Take tylenol 650 mg three times a day is the best evidence based medicine we have for arthritis.  Glucosamine sulfate 750mg  twice a day is a supplement that has been shown to help moderate to severe arthritis. Vitamin D 2000 IU daily Fish oil 2 grams daily.  Tumeric 500mg  twice daily.  Capsaicin topically up to four times a day may also help with pain. Cortisone injections are an option if these interventions do not seem to make a difference or need more relief.  If cortisone injections do not help, there are different types of shots that may help but they take longer to take effect.  We can discuss this at follow up.  It's important that you continue to stay active..  Water aerobics and cycling with low resistance are the best two types of exercise for arthritis. Come back and see me in 4 weeks.

## 2014-03-14 NOTE — Telephone Encounter (Signed)
Closing encounter

## 2014-03-18 ENCOUNTER — Telehealth: Payer: Self-pay | Admitting: Neurology

## 2014-03-18 NOTE — Telephone Encounter (Signed)
I am happy to see him as a 2nd opinion but please make sure they understand that we are not transferring care to me. Thanks.

## 2014-03-18 NOTE — Telephone Encounter (Signed)
Wife calling to set up consultation with Dr. Janann Colonel is this aproved?

## 2014-03-19 NOTE — Telephone Encounter (Signed)
Ok I will call and schedule. thanks

## 2014-03-20 DIAGNOSIS — M25519 Pain in unspecified shoulder: Secondary | ICD-10-CM | POA: Diagnosis not present

## 2014-03-20 DIAGNOSIS — R269 Unspecified abnormalities of gait and mobility: Secondary | ICD-10-CM | POA: Diagnosis not present

## 2014-03-20 DIAGNOSIS — R262 Difficulty in walking, not elsewhere classified: Secondary | ICD-10-CM | POA: Diagnosis not present

## 2014-03-20 DIAGNOSIS — M6281 Muscle weakness (generalized): Secondary | ICD-10-CM | POA: Diagnosis not present

## 2014-03-21 DIAGNOSIS — M999 Biomechanical lesion, unspecified: Secondary | ICD-10-CM | POA: Diagnosis not present

## 2014-03-21 DIAGNOSIS — M5137 Other intervertebral disc degeneration, lumbosacral region: Secondary | ICD-10-CM | POA: Diagnosis not present

## 2014-03-25 DIAGNOSIS — M6281 Muscle weakness (generalized): Secondary | ICD-10-CM | POA: Diagnosis not present

## 2014-03-25 DIAGNOSIS — R262 Difficulty in walking, not elsewhere classified: Secondary | ICD-10-CM | POA: Diagnosis not present

## 2014-03-25 DIAGNOSIS — R269 Unspecified abnormalities of gait and mobility: Secondary | ICD-10-CM | POA: Diagnosis not present

## 2014-03-25 DIAGNOSIS — M25519 Pain in unspecified shoulder: Secondary | ICD-10-CM | POA: Diagnosis not present

## 2014-03-26 DIAGNOSIS — R413 Other amnesia: Secondary | ICD-10-CM | POA: Diagnosis not present

## 2014-03-26 DIAGNOSIS — G20 Parkinson's disease: Secondary | ICD-10-CM | POA: Diagnosis not present

## 2014-03-29 ENCOUNTER — Encounter: Payer: Self-pay | Admitting: Cardiology

## 2014-03-29 ENCOUNTER — Ambulatory Visit (INDEPENDENT_AMBULATORY_CARE_PROVIDER_SITE_OTHER): Payer: Medicare Other | Admitting: Cardiology

## 2014-03-29 ENCOUNTER — Ambulatory Visit (INDEPENDENT_AMBULATORY_CARE_PROVIDER_SITE_OTHER)
Admission: RE | Admit: 2014-03-29 | Discharge: 2014-03-29 | Disposition: A | Discharge status: Home / Self Care | Payer: Medicare Other | Source: Ambulatory Visit | Attending: Cardiology | Admitting: Cardiology

## 2014-03-29 VITALS — BP 137/65 | HR 62 | Ht 69.0 in | Wt 220.1 lb

## 2014-03-29 DIAGNOSIS — R059 Cough, unspecified: Secondary | ICD-10-CM

## 2014-03-29 DIAGNOSIS — I10 Essential (primary) hypertension: Secondary | ICD-10-CM

## 2014-03-29 DIAGNOSIS — I252 Old myocardial infarction: Secondary | ICD-10-CM

## 2014-03-29 DIAGNOSIS — I4891 Unspecified atrial fibrillation: Secondary | ICD-10-CM

## 2014-03-29 DIAGNOSIS — I2510 Atherosclerotic heart disease of native coronary artery without angina pectoris: Secondary | ICD-10-CM | POA: Diagnosis not present

## 2014-03-29 DIAGNOSIS — R05 Cough: Secondary | ICD-10-CM | POA: Diagnosis not present

## 2014-03-29 DIAGNOSIS — R55 Syncope and collapse: Secondary | ICD-10-CM

## 2014-03-29 DIAGNOSIS — K648 Other hemorrhoids: Secondary | ICD-10-CM

## 2014-03-29 NOTE — Progress Notes (Signed)
HPI The patient presents for evaluation of CAD.  He has had previous bypass. He has had atrial fibrillation with his last cardioversion in 2011. He's not had any symptomatic paroxysms of this. He is somewhat limited with Parkinson's disease. He denies any chest pressure, neck or arm discomfort. He's not having any palpitations, presyncope or syncope. He's had no PND or orthopnea.  He was last seen prior to possible back surgery and I did a stress perfusion study in 2013. This was negative but he never had the back surgery.  He is not on Lipitor or other statin because of his muscle problems.  He is very limited in activity because of this and his balance.  He has had a recent cough but no fever or chills.    Allergies  Allergen Reactions  . Lisinopril Swelling  . Statins     parkinsons     Current Outpatient Prescriptions  Medication Sig Dispense Refill  . albuterol (PROAIR HFA) 108 (90 BASE) MCG/ACT inhaler Inhale 2 puffs into the lungs every 4 (four) hours as needed. For wheezing  3 Inhaler  3  . ALPRAZolam (XANAX) 0.5 MG tablet Take 0.5 mg by mouth. As needed      . amLODipine (NORVASC) 5 MG tablet Take 1 tablet (5 mg total) by mouth daily.  90 tablet  3  . apixaban (ELIQUIS) 5 MG TABS tablet Take 1 tablet (5 mg total) by mouth 2 (two) times daily.  180 tablet  3  . atenolol (TENORMIN) 50 MG tablet TAKE 1 TABLET AT BEDTIME  90 tablet  3  . buPROPion (WELLBUTRIN XL) 150 MG 24 hr tablet TAKE 1 TABLET AT BEDTIME  90 tablet  3  . carbidopa-levodopa (SINEMET IR) 25-100 MG per tablet Take 1 tablet by mouth 4 (four) times daily. Take at 10 AM, 2 PM, 6 PM and 10 PM per last OV note  360 tablet  1  . Cholecalciferol (VITAMIN D3) 2000 UNITS TABS Take 1 tablet by mouth daily.      . clonazePAM (KLONOPIN) 1 MG tablet Take 1.5 mg by mouth every evening. Can take 1/2 tablet more, if having difficulty sleeping.      . Coenzyme Q10 (CO Q 10 PO) Take 1 tablet by mouth at bedtime.      Marland Kitchen desloratadine  (CLARINEX) 5 MG tablet Take 1 tablet (5 mg total) by mouth daily.  90 tablet  3  . docusate sodium (COLACE) 100 MG capsule Take 200 mg by mouth at bedtime.      . donepezil (ARICEPT) 23 MG TABS tablet TAKE 1 TABLET EVERY MORNING  90 tablet  1  . FLUoxetine (PROZAC) 40 MG capsule Take 1 capsule (40 mg total) by mouth every morning.  90 capsule  3  . fluticasone (VERAMYST) 27.5 MCG/SPRAY nasal spray Place 1 spray into the nose 2 (two) times daily.      . Fluticasone-Salmeterol (ADVAIR) 250-50 MCG/DOSE AEPB Inhale 1 puff into the lungs every 12 (twelve) hours. As needed      . guaifenesin (HUMIBID E) 400 MG TABS Take 400 mg by mouth every 4 (four) hours as needed. For chest congestion      . ibuprofen (ADVIL,MOTRIN) 200 MG tablet Take 400 mg by mouth every 6 (six) hours as needed for pain.      Marland Kitchen L-Methylfolate-B12-B6-B2 (CEREFOLIN) 04-08-49-5 MG TABS TAKE 1 TABLET DAILY  90 each  3  . levocetirizine (XYZAL) 5 MG tablet Take 1 tablet (5 mg total)  by mouth every evening.  90 tablet  3  . Multiple Vitamin (MULTIVITAMINS PO) Take 1 tablet by mouth at bedtime.       . Olopatadine HCl (PATADAY) 0.2 % SOLN Apply to eye.      . primidone (MYSOLINE) 50 MG tablet TAKE 1 TABLET (50MG ) 2     TIMES A DAY AS DIRECTED  180 tablet  3  . solifenacin (VESICARE) 5 MG tablet Take 10 mg by mouth daily.      . TURMERIC PO Take 250 mg by mouth daily.       No current facility-administered medications for this visit.    Past Medical History  Diagnosis Date  . ONYCHOMYCOSIS, TOENAILS 06/27/2008  . HYPERLIPIDEMIA 07/28/2007  . ANXIETY 09/28/2007  . OBSTRUCTIVE SLEEP APNEA 01/01/2008  . RESTLESS LEGS SYNDROME 12/26/2007  . HEARING LOSS 06/27/2008  . HYPERTENSION 07/28/2007  . MYOCARDIAL INFARCTION, HX OF 07/28/2007  . CORONARY ARTERY DISEASE 07/28/2007  . Atrial fibrillation 09/28/2007  . SINUSITIS- ACUTE-NOS 10/10/2007  . ALLERGIC RHINITIS 09/28/2007  . CHOLELITHIASIS 09/28/2007  . BENIGN PROSTATIC HYPERTROPHY 09/28/2007   . CELLULITIS, HAND, LEFT 09/24/2010  . SYNCOPE 07/28/2007  . INSOMNIA-SLEEP DISORDER-UNSPEC 04/03/2009  . HYPERSOMNIA WITH SLEEP APNEA UNSPECIFIED 12/26/2007  . FATIGUE 01/01/2008  . DYSPNEA 04/03/2009  . Wheezing 11/06/2009  . ANGIOEDEMA 11/06/2009  . COLON CANCER, HX OF 07/28/2007  . PROSTATE CANCER, HX OF 07/28/2007  . COLONIC POLYPS, HX OF 07/28/2007  . Long term current use of anticoagulant 12/22/2010  . Asthma   . DEMENTIA   . Spinal stenosis   . History of atrial fibrillation   . Lumbar stenosis 10/30/2011  . CAD (coronary artery disease)     Severe three-vessel coronary disease. LIMA to the LAD patent. SVG to PDA patent. SVG to OM 3 patent. Preserved ejection fraction. 2008.  . Cancer   . Thyroid disease   . Arthritis   . Pneumonia   . 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 Sepsis with acute organ dysfunction 09/21/2011  . 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 Prostatectomy  2001  . Cardioversion  2007  . Stress cardiolite  02/08/2007  . Schocardiograph  10/07/2006  . Right thyroid lobectomy    . Coronary artery bypass graft  1995    x 3  . Hernia repair  1998  . Colonoscopy  2010    last done 2010- clear  . Cardiac electrophysiology study and ablation    . Thyroidectomy, partial      ROS: As stated in the HPI and negative for all other systems.  PHYSICAL EXAM BP 137/65  Pulse 62  Ht 5\' 9"  (1.753 m)  Wt 220 lb 1.9 oz (99.846 kg)  BMI 32.49 kg/m2 GENERAL:  Chronically ill appearing. NECK:  No jugular venous distention at 90 degrees, waveform within normal limits, carotid upstroke brisk and symmetric, no bruits, no thyromegaly LYMPHATICS:  No cervical, inguinal adenopathy LUNGS:  Bilateral basilar crackles BACK:  No CVA tenderness CHEST:  Well healed sternotomy scar. HEART:  S1 and S2 within normal limits, no S3, no S4, no clicks, no  rubs, no murmurs ABD:  Flat, positive bowel sounds normal in frequency in pitch, no bruits, no rebound, patient seated EXT:  2 plus pulses throughout, no edema, no cyanosis no clubbing, muscle wasting SKIN:  No rashes no nodules NEURO:  Cranial  nerves II through XII grossly intact, motor grossly intact throughout [facility]:  Cognitively intact, oriented to person place and time, baseline tremor  EKG:  Sinus rhythm, rate 62, left axis deviation, nonspecific T-wave flattening.  03/29/2014   ASSESSMENT AND PLAN  ATRIAL FIBRILLATION  The patient seems to be maintaining sinus rhythm. However, with his past risk factors and high thromboembolic risk we will continue anticoagulation. He is doing well on Eliquis. He has no contraindications to this.  He has normal renal function.  CABG The patient has no new sypmtoms since stress testing in 2013.  No further cardiovascular testing is indicated.  We will continue with aggressive risk reduction and meds as listed.   COUGH I will send him for a chest Xray.  For now he will remain on the meds as listed.

## 2014-03-29 NOTE — Patient Instructions (Signed)
The current medical regimen is effective;  continue present plan and medications.  Please have a chest X-Ray at the [facility] office.  Follow up in 1 year with Dr Percival Spanish.  You will receive a letter in the mail 2 months before you are due.  Please call us when you receive this letter to schedule your follow up appointment.

## 2014-04-03 DIAGNOSIS — M25519 Pain in unspecified shoulder: Secondary | ICD-10-CM | POA: Diagnosis not present

## 2014-04-03 DIAGNOSIS — M6281 Muscle weakness (generalized): Secondary | ICD-10-CM | POA: Diagnosis not present

## 2014-04-03 DIAGNOSIS — R262 Difficulty in walking, not elsewhere classified: Secondary | ICD-10-CM | POA: Diagnosis not present

## 2014-04-03 DIAGNOSIS — R269 Unspecified abnormalities of gait and mobility: Secondary | ICD-10-CM | POA: Diagnosis not present

## 2014-04-04 DIAGNOSIS — M25519 Pain in unspecified shoulder: Secondary | ICD-10-CM | POA: Diagnosis not present

## 2014-04-04 DIAGNOSIS — R269 Unspecified abnormalities of gait and mobility: Secondary | ICD-10-CM | POA: Diagnosis not present

## 2014-04-04 DIAGNOSIS — M6281 Muscle weakness (generalized): Secondary | ICD-10-CM | POA: Diagnosis not present

## 2014-04-04 DIAGNOSIS — R262 Difficulty in walking, not elsewhere classified: Secondary | ICD-10-CM | POA: Diagnosis not present

## 2014-04-06 ENCOUNTER — Other Ambulatory Visit: Payer: Self-pay | Admitting: Neurology

## 2014-04-08 ENCOUNTER — Telehealth: Payer: Self-pay | Admitting: Internal Medicine

## 2014-04-08 NOTE — Telephone Encounter (Signed)
Pt called stated he spoke with [facility] and told him to get Dr. Jenny Reichmann to send new Rx (old one expire) for deslratadin 5 mg 90 days supply with 3 refill.

## 2014-04-09 DIAGNOSIS — M6281 Muscle weakness (generalized): Secondary | ICD-10-CM | POA: Diagnosis not present

## 2014-04-09 DIAGNOSIS — M25519 Pain in unspecified shoulder: Secondary | ICD-10-CM | POA: Diagnosis not present

## 2014-04-09 DIAGNOSIS — R262 Difficulty in walking, not elsewhere classified: Secondary | ICD-10-CM | POA: Diagnosis not present

## 2014-04-09 DIAGNOSIS — R269 Unspecified abnormalities of gait and mobility: Secondary | ICD-10-CM | POA: Diagnosis not present

## 2014-04-09 MED ORDER — DESLORATADINE 5 MG PO TABS: 5.0000 mg | ORAL_TABLET | Freq: Every day | ORAL | Status: DC

## 2014-04-09 NOTE — Telephone Encounter (Signed)
Called the patients wife informed script sent in to pharmacy

## 2014-04-10 ENCOUNTER — Ambulatory Visit: Payer: Medicare Other | Admitting: Family Medicine

## 2014-04-11 DIAGNOSIS — M25519 Pain in unspecified shoulder: Secondary | ICD-10-CM | POA: Diagnosis not present

## 2014-04-11 DIAGNOSIS — M6281 Muscle weakness (generalized): Secondary | ICD-10-CM | POA: Diagnosis not present

## 2014-04-11 DIAGNOSIS — R269 Unspecified abnormalities of gait and mobility: Secondary | ICD-10-CM | POA: Diagnosis not present

## 2014-04-11 DIAGNOSIS — R262 Difficulty in walking, not elsewhere classified: Secondary | ICD-10-CM | POA: Diagnosis not present

## 2014-04-15 ENCOUNTER — Ambulatory Visit (INDEPENDENT_AMBULATORY_CARE_PROVIDER_SITE_OTHER): Payer: Medicare Other | Admitting: Family Medicine

## 2014-04-15 ENCOUNTER — Encounter: Payer: Self-pay | Admitting: Family Medicine

## 2014-04-15 VITALS — BP 130/80 | HR 67 | Ht 69.0 in | Wt 215.0 lb

## 2014-04-15 DIAGNOSIS — M25519 Pain in unspecified shoulder: Secondary | ICD-10-CM | POA: Diagnosis not present

## 2014-04-15 DIAGNOSIS — M6281 Muscle weakness (generalized): Secondary | ICD-10-CM | POA: Diagnosis not present

## 2014-04-15 DIAGNOSIS — M5010 Cervical disc disorder with radiculopathy, unspecified cervical region: Secondary | ICD-10-CM

## 2014-04-15 DIAGNOSIS — M5412 Radiculopathy, cervical region: Secondary | ICD-10-CM | POA: Diagnosis not present

## 2014-04-15 DIAGNOSIS — I2510 Atherosclerotic heart disease of native coronary artery without angina pectoris: Secondary | ICD-10-CM

## 2014-04-15 DIAGNOSIS — R262 Difficulty in walking, not elsewhere classified: Secondary | ICD-10-CM | POA: Diagnosis not present

## 2014-04-15 DIAGNOSIS — R269 Unspecified abnormalities of gait and mobility: Secondary | ICD-10-CM | POA: Diagnosis not present

## 2014-04-15 HISTORY — DX: Cervical disc disorder with radiculopathy, unspecified cervical region: M50.10

## 2014-04-15 MED ORDER — DICLOFENAC SODIUM 2 % TD SOLN: 2.0000 "application " | Freq: Two times a day (BID) | TRANSDERMAL | Status: DC

## 2014-04-15 NOTE — Assessment & Plan Note (Addendum)
Patient pain from his shoulders not radiculopathy likely this more radiculopathy from his cervical spine. Patient does have severe Parkinson as well that is shooting. I do believe that this time that further medications that could be helpful would cause worsening of his balance and coordination and cause risk of fall. I do feel that an epidural steroid injection at the C7-T1 level could be beneficial. Patient will be scheduled for this. Patient will go to physical therapy for more of balance and coordination. Patient will come back one week after the epidural steroid injection for further evaluation. We also will start a topical anti-inflammatory. Spent greater than 25 minutes with patient face-to-face and had greater than 50% of counseling including as described above in assessment and plan.

## 2014-04-15 NOTE — Progress Notes (Signed)
  [name], [name] [name] [address] Phone: [phone] Subjective:     CC: Left shoulder pain  [name] is a 79 y.o. male coming in with complaint of left shoulder pain. Patient was seen previously and was given a diagnosis of a rotator cuff arthropathy Patient states that he continues to have the burning sensation going down the left arm but stopping before the elbow. Patient does have a past medical history of Parkinson's. Patient's severe rotator cuff arthropathy was injected at last visit on ultrasound guidance and did not have any improvement. Patient continues as stated above with the radiation down the arm with some weakness. Patient states that the pain is so severe he has difficulty with some daily activities. Patient has also been falling more often. Patient is accompanied with wife would like further workup if available.    Past medical history, social, surgical and family history all reviewed in electronic medical record.   Review of Systems: No headache, visual changes, nausea, vomiting, diarrhea, constipation, dizziness, abdominal pain, skin rash, fevers, chills, night sweats, weight loss, swollen lymph nodes, body aches, joint swelling, muscle aches, chest pain, shortness of breath, mood changes.   Objective Blood pressure 130/80, pulse 67, height 5\' 9"  (1.753 m), weight 215 lb (97.523 kg), SpO2 93.00%.  General: No apparent distress alert and oriented x3 mood and affect normal, dressed appropriately. Masked fasces   HEENT: Pupils equal, extraocular movements intact  Respiratory: Patient's speak in full sentences and does not appear short of breath  Cardiovascular: No lower extremity edema, non tender, no erythema  Skin: Warm dry intact with no signs of infection or rash on extremities or on axial skeleton.  Abdomen: Soft nontender  Neuro: Cranial nerves II through XII are intact, neurovascularly intact in all  extremities with 2+ DTRs and 2+ pulses.  Lymph: No lymphadenopathy of posterior or anterior cervical chain or axillae bilaterally.  Gait wide based gait with shuffling.  MSK:  Non tender but cogwheeling noted but good stability and symmetric strength and tone of  elbows, wrist, hip, knee and ankles bilaterally.   Shoulder: Left Inspection reveals significant atrophy Palpation is generalized tenderness.  ROM severely restricted with significant crepitus. Rotator cuff strength is 2/5 signs of impingement with positive Neer and Hawkin's tests positive empty can sign. Speeds and Yergason's tests normal. Unable to do his exam secondary to pain as well as severe crepitus.  Neck: Inspection unremarkable. No palpable stepoffs. Positive Spurling's maneuver. Decreased range of motion especially in extension as well as side bending Patient does have a resting tremor of the right hand and does have cogwheeling of the extremities. Strength good C4 to T1 distribution No sensory change to C4 to T1 Negative Hoffman sign bilaterally Reflexes hyperactive in the upper extremities.    Impression and Recommendations:     This case required medical decision making of moderate complexity.

## 2014-04-15 NOTE — Patient Instructions (Signed)
Good to see you Try pennsaid 2 times daily Ice is your best friend.  They will call you on epidural injections Will fix balance and coordination.  Come back again 1 week after injection.

## 2014-04-16 DIAGNOSIS — R413 Other amnesia: Secondary | ICD-10-CM | POA: Diagnosis not present

## 2014-04-16 DIAGNOSIS — G20 Parkinson's disease: Secondary | ICD-10-CM | POA: Diagnosis not present

## 2014-04-18 DIAGNOSIS — M25519 Pain in unspecified shoulder: Secondary | ICD-10-CM | POA: Diagnosis not present

## 2014-04-18 DIAGNOSIS — M6281 Muscle weakness (generalized): Secondary | ICD-10-CM | POA: Diagnosis not present

## 2014-04-18 DIAGNOSIS — R269 Unspecified abnormalities of gait and mobility: Secondary | ICD-10-CM | POA: Diagnosis not present

## 2014-04-18 DIAGNOSIS — R262 Difficulty in walking, not elsewhere classified: Secondary | ICD-10-CM | POA: Diagnosis not present

## 2014-04-24 DIAGNOSIS — M6281 Muscle weakness (generalized): Secondary | ICD-10-CM | POA: Diagnosis not present

## 2014-04-24 DIAGNOSIS — R262 Difficulty in walking, not elsewhere classified: Secondary | ICD-10-CM | POA: Diagnosis not present

## 2014-04-24 DIAGNOSIS — M25519 Pain in unspecified shoulder: Secondary | ICD-10-CM | POA: Diagnosis not present

## 2014-04-24 DIAGNOSIS — R269 Unspecified abnormalities of gait and mobility: Secondary | ICD-10-CM | POA: Diagnosis not present

## 2014-04-25 DIAGNOSIS — R269 Unspecified abnormalities of gait and mobility: Secondary | ICD-10-CM | POA: Diagnosis not present

## 2014-04-25 DIAGNOSIS — R262 Difficulty in walking, not elsewhere classified: Secondary | ICD-10-CM | POA: Diagnosis not present

## 2014-04-25 DIAGNOSIS — M25519 Pain in unspecified shoulder: Secondary | ICD-10-CM | POA: Diagnosis not present

## 2014-04-25 DIAGNOSIS — M6281 Muscle weakness (generalized): Secondary | ICD-10-CM | POA: Diagnosis not present

## 2014-04-30 ENCOUNTER — Telehealth: Payer: Self-pay | Admitting: *Deleted

## 2014-04-30 NOTE — Telephone Encounter (Signed)
Pt has been having hiccups since last Friday. Wanting to know what md recommend to get rid of them...[name]

## 2014-05-01 MED ORDER — CHLORPROMAZINE HCL 25 MG PO TABS: 25.0000 mg | ORAL_TABLET | Freq: Four times a day (QID) | ORAL | Status: DC | PRN

## 2014-05-01 NOTE — Telephone Encounter (Signed)
Patient informed, left detailed message.

## 2014-05-01 NOTE — Telephone Encounter (Signed)
Ok for thorazine prn asd  Done erx

## 2014-05-01 NOTE — Addendum Note (Signed)
Addended by: Sharon Seller B on: 05/01/2014 03:23 PM   Modules accepted: Orders

## 2014-05-01 NOTE — Telephone Encounter (Signed)
Thorazine resent to local pharmacy.

## 2014-05-08 ENCOUNTER — Encounter: Payer: Self-pay | Admitting: Internal Medicine

## 2014-05-08 ENCOUNTER — Ambulatory Visit (INDEPENDENT_AMBULATORY_CARE_PROVIDER_SITE_OTHER): Payer: Medicare Other | Admitting: Internal Medicine

## 2014-05-08 VITALS — BP 120/78 | HR 63 | Temp 98.4°F | Wt 215.6 lb

## 2014-05-08 DIAGNOSIS — R296 Repeated falls: Secondary | ICD-10-CM

## 2014-05-08 DIAGNOSIS — I10 Essential (primary) hypertension: Secondary | ICD-10-CM | POA: Diagnosis not present

## 2014-05-08 DIAGNOSIS — J309 Allergic rhinitis, unspecified: Secondary | ICD-10-CM

## 2014-05-08 DIAGNOSIS — I2510 Atherosclerotic heart disease of native coronary artery without angina pectoris: Secondary | ICD-10-CM

## 2014-05-08 DIAGNOSIS — Z7901 Long term (current) use of anticoagulants: Secondary | ICD-10-CM | POA: Diagnosis not present

## 2014-05-08 DIAGNOSIS — Z9181 History of falling: Secondary | ICD-10-CM | POA: Diagnosis not present

## 2014-05-08 MED ORDER — AZITHROMYCIN 250 MG PO TABS: ORAL_TABLET | ORAL | Status: DC

## 2014-05-08 MED ORDER — GUAIFENESIN-DM 100-10 MG/5ML PO SYRP: 5.0000 mL | ORAL_SOLUTION | ORAL | Status: DC | PRN

## 2014-05-08 MED ORDER — METHYLPREDNISOLONE ACETATE 80 MG/ML IJ SUSP: 80.0000 mg | Freq: Once | INTRAMUSCULAR | Status: DC

## 2014-05-08 MED ORDER — PREDNISONE 10 MG PO TABS: ORAL_TABLET | ORAL | Status: DC

## 2014-05-08 MED ORDER — METHYLPREDNISOLONE ACETATE 80 MG/ML IJ SUSP
80.0000 mg | Freq: Once | INTRAMUSCULAR | Status: AC
  Administered 2014-05-08: 80 mg via INTRAMUSCULAR

## 2014-05-08 NOTE — Patient Instructions (Addendum)
You had the steroid shot today  Please take all new medication as prescribed - the antibiotic, cough medicine, and prednisone  OK to stop the atenolol for now in case he is becoming sensitive to it, leading to the falls  Please re-start the PT as you get better  Please call and return for lab work if not starting to get better in 1-2 days  Please continue all other medications as before, and refills have been done if requested.  Please have the pharmacy call with any other refills you may need.  Please keep your appointments with your specialists as you may have planned  Please return in 1 week, or sooner if needed

## 2014-05-08 NOTE — Progress Notes (Signed)
Pre visit review using our clinic review tool, if applicable. No additional management support is needed unless otherwise documented below in the visit note. 

## 2014-05-08 NOTE — Assessment & Plan Note (Signed)
Mild to mod, for depomedrol IM, predpac asd, to f/u any worsening symptoms or concerns 

## 2014-05-08 NOTE — Assessment & Plan Note (Addendum)
Cont PT as able, but currently unable, I advised full lab eval but wife declines tonight or tomorrow at this point,  to f/u any worsening symptoms or concerns  Note:  Total time for pt hx, exam, review of record with pt in the room, determination of diagnoses and plan for further eval and tx is > 40 min, with over 50% spent in coordination and counseling of patient

## 2014-05-08 NOTE — Assessment & Plan Note (Signed)
May need to revisit use of eliquis in light of freq falls

## 2014-05-08 NOTE — Assessment & Plan Note (Signed)
Cannot rule out beta blocker senstivity as cause for freq falls - to d/c for now, f/u 1 wk

## 2014-05-08 NOTE — Progress Notes (Signed)
Subjective:    Patient ID: [name] [name], male    DOB: 11-05-1935, 79 y.o.   MRN: [id number]  HPI  Here after recent  hiccups for 10 days, did not tolerate thorazine (due to increased confusion), has been taking tumeric for approx 1 mo, stopped this and hiccups gone within a few days, not clear if related. Does have several wks ongoing nasal allergy symptoms with clearish congestion, itch and sneezing, without fever, pain, ST, cough, swelling or wheezing. Assoc with Right ear pain and congestion with popping. ? fever but no swollen glands.  During this time possibly less po intake, increased weak and recurrent falls, ems called to home 6 time over 10 days to pick him up of the floor, plus others now employed by wife to help doing the same. Has seen card recently approx 3 wks, but falls started after that, has been on atenolol for > 1 yr.  Has appt with neurology/movement specialist in August. Getting PT, but did not go this wk due to weakness this wk. Lab is currently closed, wife would like to avoid labs this time due to transportation issue as well. Has hx of atypical parkinsons, dementia, mult-system atrophy, lumbar stenosis, cervical disc dz Past Medical History  Diagnosis Date  . ONYCHOMYCOSIS, TOENAILS 06/27/2008  . HYPERLIPIDEMIA 07/28/2007  . ANXIETY 09/28/2007  . OBSTRUCTIVE SLEEP APNEA 01/01/2008  . RESTLESS LEGS SYNDROME 12/26/2007  . HEARING LOSS 06/27/2008  . HYPERTENSION 07/28/2007  . MYOCARDIAL INFARCTION, HX OF 07/28/2007  . CORONARY ARTERY DISEASE 07/28/2007  . Atrial fibrillation 09/28/2007  . SINUSITIS- ACUTE-NOS 10/10/2007  . ALLERGIC RHINITIS 09/28/2007  . CHOLELITHIASIS 09/28/2007  . BENIGN PROSTATIC HYPERTROPHY 09/28/2007  . CELLULITIS, HAND, LEFT 09/24/2010  . SYNCOPE 07/28/2007  . INSOMNIA-SLEEP DISORDER-UNSPEC 04/03/2009  . HYPERSOMNIA WITH SLEEP APNEA UNSPECIFIED 12/26/2007  . FATIGUE 01/01/2008  . DYSPNEA 04/03/2009  . Wheezing 11/06/2009  . ANGIOEDEMA 11/06/2009  . COLON  CANCER, HX OF 07/28/2007  . PROSTATE CANCER, HX OF 07/28/2007  . COLONIC POLYPS, HX OF 07/28/2007  . Long term current use of anticoagulant 12/22/2010  . Asthma   . DEMENTIA   . Spinal stenosis   . History of atrial fibrillation   . Lumbar stenosis 10/30/2011  . CAD (coronary artery disease)     Severe three-vessel coronary disease. LIMA to the LAD patent. SVG to PDA patent. SVG to OM 3 patent. Preserved ejection fraction. 2008.  . Cancer   . Thyroid disease   . Arthritis   . Pneumonia   . 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 Sepsis with acute organ dysfunction 09/21/2011  . 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 Prostatectomy  2001  . Cardioversion  2007  . Stress cardiolite  02/08/2007  . Schocardiograph  10/07/2006  . Right thyroid lobectomy    . Coronary artery bypass graft  1995    x 3  . Hernia repair  1998  . Colonoscopy  2010    last done 2010- clear  . Cardiac electrophysiology study and ablation    . Thyroidectomy, partial      reports that he has never smoked. He has never used smokeless tobacco. He reports that he does not drink alcohol or use illicit drugs. family history includes Coronary artery disease in his father and sister; Hypertension in his other. Allergies  Allergen Reactions  . Lisinopril Swelling  .  Statins     parkinsons   . Thorazine [Chlorpromazine]     Confusion/loopy   Current Outpatient Prescriptions on File Prior to Visit  Medication Sig Dispense Refill  . albuterol (PROAIR HFA) 108 (90 BASE) MCG/ACT inhaler Inhale 2 puffs into the lungs every 4 (four) hours as needed. For wheezing  3 Inhaler  3  . ALPRAZolam (XANAX) 0.5 MG tablet Take 0.5 mg by mouth. As needed      . amLODipine (NORVASC) 5 MG tablet Take 1 tablet (5 mg total) by mouth daily.  90 tablet  3  . apixaban (ELIQUIS) 5 MG TABS tablet Take 1 tablet (5  mg total) by mouth 2 (two) times daily.  180 tablet  3  . buPROPion (WELLBUTRIN XL) 150 MG 24 hr tablet TAKE 1 TABLET AT BEDTIME  90 tablet  3  . carbidopa-levodopa (SINEMET IR) 25-100 MG per tablet TAKE 1 TABLET 4 TIMES DAILYAT 10AM, 2PM. 6PM AND 10PM (PER LAST OFFICE VISIT     NOTE)  360 tablet  1  . chlorproMAZINE (THORAZINE) 25 MG tablet Take 1 tablet (25 mg total) by mouth 4 (four) times daily as needed.  40 tablet  0  . Cholecalciferol (VITAMIN D3) 2000 UNITS TABS Take 1 tablet by mouth daily.      . clonazePAM (KLONOPIN) 1 MG tablet Take 1.5 mg by mouth every evening. Can take 1/2 tablet more, if having difficulty sleeping.      . Coenzyme Q10 (CO Q 10 PO) Take 1 tablet by mouth at bedtime.      Marland Kitchen desloratadine (CLARINEX) 5 MG tablet Take 1 tablet (5 mg total) by mouth daily.  90 tablet  3  . Diclofenac Sodium (PENNSAID) 2 % SOLN Place 2 application onto the skin 2 (two) times daily.  112 g  3  . docusate sodium (COLACE) 100 MG capsule Take 200 mg by mouth at bedtime.      . donepezil (ARICEPT) 23 MG TABS tablet TAKE 1 TABLET EVERY MORNING  90 tablet  1  . FLUoxetine (PROZAC) 40 MG capsule Take 1 capsule (40 mg total) by mouth every morning.  90 capsule  3  . fluticasone (VERAMYST) 27.5 MCG/SPRAY nasal spray Place 1 spray into the nose 2 (two) times daily.      . Fluticasone-Salmeterol (ADVAIR) 250-50 MCG/DOSE AEPB Inhale 1 puff into the lungs every 12 (twelve) hours. As needed      . guaifenesin (HUMIBID E) 400 MG TABS Take 400 mg by mouth every 4 (four) hours as needed. For chest congestion      . ibuprofen (ADVIL,MOTRIN) 200 MG tablet Take 400 mg by mouth every 6 (six) hours as needed for pain.      Marland Kitchen L-Methylfolate-B12-B6-B2 (CEREFOLIN) 04-08-49-5 MG TABS TAKE 1 TABLET DAILY  90 each  3  . levocetirizine (XYZAL) 5 MG tablet Take 1 tablet (5 mg total) by mouth every evening.  90 tablet  3  . Multiple Vitamin (MULTIVITAMINS PO) Take 1 tablet by mouth at bedtime.       . Olopatadine HCl  (PATADAY) 0.2 % SOLN Apply to eye.      . primidone (MYSOLINE) 50 MG tablet TAKE 1 TABLET TWICE A DAY  AS DIRECTED  180 tablet  1  . solifenacin (VESICARE) 5 MG tablet Take 10 mg by mouth daily.      . TURMERIC PO Take 250 mg by mouth daily.       No current facility-administered medications on file prior  to visit.   Review of Systems  none other per wife, unable with pt due to dementia    Objective:   Physical Exam BP 120/78  Pulse 63  Temp(Src) 98.4 F (36.9 C) (Oral)  Wt 215 lb 9 oz (97.779 kg)  SpO2 93% VS noted, mildill, weak, disheveled somewhat, fatigued but able to stand with small assist and walk several feet from chair to walker, o/w cannot walk farther than this Constitutional: Pt appears well-developed, well-nourished.  HENT: Head: NCAT.  Right Ear: External ear normal.  Left Ear: External ear normal.  Eyes: . Pupils are equal, round, and reactive to light. Conjunctivae and EOM are normal Bilat tm's with mild erythema.  Max sinus areas non tender.  Pharynx with mild erythema, no exudate Neck: Normal range of motion. Neck supple.  Cardiovascular: Normal rate and regular rhythm.   Pulmonary/Chest: Effort normal and breath sounds normal.  Abd:  Soft, NT, ND, + BS Neurological: Pt is alert. + confused , motor grossly intact but generalized weakness Skin: Skin is warm. No rash, no LE edema Psychiatric: Pt behavior is normal. No agitation.     Assessment & Plan:

## 2014-05-13 DIAGNOSIS — M6281 Muscle weakness (generalized): Secondary | ICD-10-CM | POA: Diagnosis not present

## 2014-05-13 DIAGNOSIS — R269 Unspecified abnormalities of gait and mobility: Secondary | ICD-10-CM | POA: Diagnosis not present

## 2014-05-13 DIAGNOSIS — R262 Difficulty in walking, not elsewhere classified: Secondary | ICD-10-CM | POA: Diagnosis not present

## 2014-05-13 DIAGNOSIS — M25519 Pain in unspecified shoulder: Secondary | ICD-10-CM | POA: Diagnosis not present

## 2014-05-16 DIAGNOSIS — R262 Difficulty in walking, not elsewhere classified: Secondary | ICD-10-CM | POA: Diagnosis not present

## 2014-05-16 DIAGNOSIS — M6281 Muscle weakness (generalized): Secondary | ICD-10-CM | POA: Diagnosis not present

## 2014-05-16 DIAGNOSIS — R269 Unspecified abnormalities of gait and mobility: Secondary | ICD-10-CM | POA: Diagnosis not present

## 2014-05-16 DIAGNOSIS — M25519 Pain in unspecified shoulder: Secondary | ICD-10-CM | POA: Diagnosis not present

## 2014-05-17 ENCOUNTER — Encounter: Payer: Self-pay | Admitting: Internal Medicine

## 2014-05-17 ENCOUNTER — Ambulatory Visit (INDEPENDENT_AMBULATORY_CARE_PROVIDER_SITE_OTHER): Payer: Medicare Other | Admitting: Internal Medicine

## 2014-05-17 VITALS — BP 132/68 | HR 71 | Temp 98.4°F | Wt 222.2 lb

## 2014-05-17 DIAGNOSIS — G238 Other specified degenerative diseases of basal ganglia: Secondary | ICD-10-CM | POA: Diagnosis not present

## 2014-05-17 DIAGNOSIS — R269 Unspecified abnormalities of gait and mobility: Secondary | ICD-10-CM

## 2014-05-17 DIAGNOSIS — I2510 Atherosclerotic heart disease of native coronary artery without angina pectoris: Secondary | ICD-10-CM | POA: Diagnosis not present

## 2014-05-17 DIAGNOSIS — I10 Essential (primary) hypertension: Secondary | ICD-10-CM | POA: Diagnosis not present

## 2014-05-17 MED ORDER — PREDNISONE 10 MG PO TABS: ORAL_TABLET | ORAL | Status: DC

## 2014-05-17 NOTE — Patient Instructions (Signed)
Please take all new medication as prescribed - the prednisone  Please continue all other medications as before, and refills have been done if requested.  Please have the pharmacy call with any other refills you may need.  Please keep your appointments with your specialists as you may have planned   

## 2014-05-17 NOTE — Progress Notes (Signed)
Subjective:    Patient ID: [name] [name], male    DOB: 05/20/1935, 79 y.o.   MRN: [id number]  HPI  Here to f/u, wife gives hx and states pt with markedly improve gait, stamina and stability even the next day after seen last wk, much more active, bright in demeanor, walking near back to baseline with walker, and no falls. .Pt denies chest pain, increased sob or doe, wheezing, orthopnea, PND, increased LE swelling, palpitations, dizziness or syncope.  Pt denies new neurological symptoms such as new headache, or facial or extremity weakness or numbness   Pt denies polydipsia, polyuria,  Dementia overall stable symptomatically, and not assoc with worsening behavioral changes such as hallucinations, paranoia, or agitation. Past Medical History  Diagnosis Date  . ONYCHOMYCOSIS, TOENAILS 06/27/2008  . HYPERLIPIDEMIA 07/28/2007  . ANXIETY 09/28/2007  . OBSTRUCTIVE SLEEP APNEA 01/01/2008  . RESTLESS LEGS SYNDROME 12/26/2007  . HEARING LOSS 06/27/2008  . HYPERTENSION 07/28/2007  . MYOCARDIAL INFARCTION, HX OF 07/28/2007  . CORONARY ARTERY DISEASE 07/28/2007  . Atrial fibrillation 09/28/2007  . SINUSITIS- ACUTE-NOS 10/10/2007  . ALLERGIC RHINITIS 09/28/2007  . CHOLELITHIASIS 09/28/2007  . BENIGN PROSTATIC HYPERTROPHY 09/28/2007  . CELLULITIS, HAND, LEFT 09/24/2010  . SYNCOPE 07/28/2007  . INSOMNIA-SLEEP DISORDER-UNSPEC 04/03/2009  . HYPERSOMNIA WITH SLEEP APNEA UNSPECIFIED 12/26/2007  . FATIGUE 01/01/2008  . DYSPNEA 04/03/2009  . Wheezing 11/06/2009  . ANGIOEDEMA 11/06/2009  . COLON CANCER, HX OF 07/28/2007  . PROSTATE CANCER, HX OF 07/28/2007  . COLONIC POLYPS, HX OF 07/28/2007  . Long term current use of anticoagulant 12/22/2010  . Asthma   . DEMENTIA   . Spinal stenosis   . History of atrial fibrillation   . Lumbar stenosis 10/30/2011  . CAD (coronary artery disease)     Severe three-vessel coronary disease. LIMA to the LAD patent. SVG to PDA patent. SVG to OM 3 patent. Preserved ejection fraction.  2008.  . Cancer   . Thyroid disease   . Arthritis   . Pneumonia   . 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 Sepsis with acute organ dysfunction 09/21/2011  . 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 Cervical disc disorder with radiculopathy of cervical region 04/15/2014  . 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 Prostatectomy  2001  . Cardioversion  2007  . Stress cardiolite  02/08/2007  . Schocardiograph  10/07/2006  . Right thyroid lobectomy    . Coronary artery bypass graft  1995    x 3  . Hernia repair  1998  . Colonoscopy  2010    last done 2010- clear  . Cardiac electrophysiology study and ablation    . Thyroidectomy, partial      reports that he has never smoked. He has never used smokeless tobacco. He reports that he does not drink alcohol or use illicit drugs. family history includes Coronary artery disease in his father and sister; Hypertension in his other. Allergies  Allergen Reactions  . Lisinopril Swelling  . Statins     parkinsons   . Thorazine [Chlorpromazine]     Confusion/loopy   Current Outpatient Prescriptions on File Prior to Visit  Medication Sig Dispense Refill  . albuterol (PROAIR HFA) 108 (90 BASE) MCG/ACT inhaler Inhale 2 puffs into the lungs every 4 (four) hours as needed. For wheezing  3 Inhaler  3  . ALPRAZolam (XANAX) 0.5 MG  tablet Take 0.5 mg by mouth. As needed      . amLODipine (NORVASC) 5 MG tablet Take 1 tablet (5 mg total) by mouth daily.  90 tablet  3  . apixaban (ELIQUIS) 5 MG TABS tablet Take 1 tablet (5 mg total) by mouth 2 (two) times daily.  180 tablet  3  . buPROPion (WELLBUTRIN XL) 150 MG 24 hr tablet TAKE 1 TABLET AT BEDTIME  90 tablet  3  . carbidopa-levodopa (SINEMET IR) 25-100 MG per tablet TAKE 1 TABLET 4 TIMES DAILYAT 10AM, 2PM. 6PM AND 10PM (PER LAST OFFICE VISIT     NOTE)  360 tablet  1    . chlorproMAZINE (THORAZINE) 25 MG tablet Take 1 tablet (25 mg total) by mouth 4 (four) times daily as needed.  40 tablet  0  . Cholecalciferol (VITAMIN D3) 2000 UNITS TABS Take 1 tablet by mouth daily.      . clonazePAM (KLONOPIN) 1 MG tablet Take 1.5 mg by mouth every evening. Can take 1/2 tablet more, if having difficulty sleeping.      . Coenzyme Q10 (CO Q 10 PO) Take 1 tablet by mouth at bedtime.      Marland Kitchen desloratadine (CLARINEX) 5 MG tablet Take 1 tablet (5 mg total) by mouth daily.  90 tablet  3  . Diclofenac Sodium (PENNSAID) 2 % SOLN Place 2 application onto the skin 2 (two) times daily.  112 g  3  . docusate sodium (COLACE) 100 MG capsule Take 200 mg by mouth at bedtime.      . donepezil (ARICEPT) 23 MG TABS tablet TAKE 1 TABLET EVERY MORNING  90 tablet  1  . FLUoxetine (PROZAC) 40 MG capsule Take 1 capsule (40 mg total) by mouth every morning.  90 capsule  3  . fluticasone (VERAMYST) 27.5 MCG/SPRAY nasal spray Place 1 spray into the nose 2 (two) times daily.      . Fluticasone-Salmeterol (ADVAIR) 250-50 MCG/DOSE AEPB Inhale 1 puff into the lungs every 12 (twelve) hours. As needed      . guaifenesin (HUMIBID E) 400 MG TABS Take 400 mg by mouth every 4 (four) hours as needed. For chest congestion      . ibuprofen (ADVIL,MOTRIN) 200 MG tablet Take 400 mg by mouth every 6 (six) hours as needed for pain.      Marland Kitchen L-Methylfolate-B12-B6-B2 (CEREFOLIN) 04-08-49-5 MG TABS TAKE 1 TABLET DAILY  90 each  3  . levocetirizine (XYZAL) 5 MG tablet Take 1 tablet (5 mg total) by mouth every evening.  90 tablet  3  . Multiple Vitamin (MULTIVITAMINS PO) Take 1 tablet by mouth at bedtime.       . Olopatadine HCl (PATADAY) 0.2 % SOLN Apply to eye.      . primidone (MYSOLINE) 50 MG tablet TAKE 1 TABLET TWICE A DAY  AS DIRECTED  180 tablet  1  . solifenacin (VESICARE) 5 MG tablet Take 10 mg by mouth daily.      . TURMERIC PO Take 250 mg by mouth daily.       No current facility-administered medications on file  prior to visit.     Review of Systems  Constitutional: Negative for unusual diaphoresis or other sweats  HENT: Negative for ringing in ear Eyes: Negative for double vision or worsening visual disturbance.  Respiratory: Negative for choking and stridor.   Gastrointestinal: Negative for vomiting or other signifcant bowel change Genitourinary: Negative for hematuria or decreased urine volume.  Musculoskeletal: Negative for other  MSK pain or swelling Skin: Negative for color change and worsening wound.  Neurological: Negative for tremors and numbness other than noted  Psychiatric/Behavioral: Negative for decreased concentration or agitation other than above       Objective:   Physical Exam BP 132/68  Pulse 71  Temp(Src) 98.4 F (36.9 C) (Oral)  Wt 222 lb 4 oz (100.812 kg)  SpO2 95% VS noted,  Constitutional: Pt appears well-developed, well-nourished.  HENT: Head: NCAT.  Right Ear: External ear normal.  Left Ear: External ear normal.  Eyes: . Pupils are equal, round, and reactive to light. Conjunctivae and EOM are normal Neck: Normal range of motion. Neck supple.  Cardiovascular: Normal rate and regular rhythm.   Pulmonary/Chest: Effort normal and breath sounds normal.  Abd:  Soft, NT, ND, + BS Neurological: Pt is alert. Not confused , motor grossly intact Skin: Skin is warm. No rash Psychiatric: Pt behavior is normal. No agitation.     Assessment & Plan:

## 2014-05-17 NOTE — Progress Notes (Signed)
Pre visit review using our clinic review tool, if applicable. No additional management support is needed unless otherwise documented below in the visit note. 

## 2014-05-18 NOTE — Assessment & Plan Note (Signed)
stable overall by history and exam, recent data reviewed with pt, and pt to continue medical treatment as before,  to f/u any worsening symptoms or concerns BP Readings from Last 3 Encounters:  05/17/14 132/68  05/08/14 120/78  04/15/14 130/80

## 2014-05-18 NOTE — Assessment & Plan Note (Signed)
Ok for short course prednisone asd

## 2014-05-18 NOTE — Assessment & Plan Note (Signed)
Ability in past wk much improved, not clear if due to stopping the beta blocker , or predpack course, or both.

## 2014-05-21 DIAGNOSIS — M6281 Muscle weakness (generalized): Secondary | ICD-10-CM | POA: Diagnosis not present

## 2014-05-21 DIAGNOSIS — M25519 Pain in unspecified shoulder: Secondary | ICD-10-CM | POA: Diagnosis not present

## 2014-05-21 DIAGNOSIS — R262 Difficulty in walking, not elsewhere classified: Secondary | ICD-10-CM | POA: Diagnosis not present

## 2014-05-21 DIAGNOSIS — R269 Unspecified abnormalities of gait and mobility: Secondary | ICD-10-CM | POA: Diagnosis not present

## 2014-05-27 DIAGNOSIS — R269 Unspecified abnormalities of gait and mobility: Secondary | ICD-10-CM | POA: Diagnosis not present

## 2014-05-27 DIAGNOSIS — R262 Difficulty in walking, not elsewhere classified: Secondary | ICD-10-CM | POA: Diagnosis not present

## 2014-05-27 DIAGNOSIS — M6281 Muscle weakness (generalized): Secondary | ICD-10-CM | POA: Diagnosis not present

## 2014-05-27 DIAGNOSIS — M25519 Pain in unspecified shoulder: Secondary | ICD-10-CM | POA: Diagnosis not present

## 2014-05-30 DIAGNOSIS — R262 Difficulty in walking, not elsewhere classified: Secondary | ICD-10-CM | POA: Diagnosis not present

## 2014-05-30 DIAGNOSIS — R269 Unspecified abnormalities of gait and mobility: Secondary | ICD-10-CM | POA: Diagnosis not present

## 2014-05-30 DIAGNOSIS — M25519 Pain in unspecified shoulder: Secondary | ICD-10-CM | POA: Diagnosis not present

## 2014-05-30 DIAGNOSIS — M6281 Muscle weakness (generalized): Secondary | ICD-10-CM | POA: Diagnosis not present

## 2014-06-03 DIAGNOSIS — R269 Unspecified abnormalities of gait and mobility: Secondary | ICD-10-CM | POA: Diagnosis not present

## 2014-06-03 DIAGNOSIS — M25519 Pain in unspecified shoulder: Secondary | ICD-10-CM | POA: Diagnosis not present

## 2014-06-03 DIAGNOSIS — R262 Difficulty in walking, not elsewhere classified: Secondary | ICD-10-CM | POA: Diagnosis not present

## 2014-06-03 DIAGNOSIS — M6281 Muscle weakness (generalized): Secondary | ICD-10-CM | POA: Diagnosis not present

## 2014-06-04 DIAGNOSIS — M25519 Pain in unspecified shoulder: Secondary | ICD-10-CM | POA: Diagnosis not present

## 2014-06-04 DIAGNOSIS — M6281 Muscle weakness (generalized): Secondary | ICD-10-CM | POA: Diagnosis not present

## 2014-06-04 DIAGNOSIS — R269 Unspecified abnormalities of gait and mobility: Secondary | ICD-10-CM | POA: Diagnosis not present

## 2014-06-04 DIAGNOSIS — R262 Difficulty in walking, not elsewhere classified: Secondary | ICD-10-CM | POA: Diagnosis not present

## 2014-06-10 DIAGNOSIS — M5137 Other intervertebral disc degeneration, lumbosacral region: Secondary | ICD-10-CM | POA: Diagnosis not present

## 2014-06-10 DIAGNOSIS — M999 Biomechanical lesion, unspecified: Secondary | ICD-10-CM | POA: Diagnosis not present

## 2014-06-12 DIAGNOSIS — R269 Unspecified abnormalities of gait and mobility: Secondary | ICD-10-CM | POA: Diagnosis not present

## 2014-06-12 DIAGNOSIS — M25519 Pain in unspecified shoulder: Secondary | ICD-10-CM | POA: Diagnosis not present

## 2014-06-12 DIAGNOSIS — R262 Difficulty in walking, not elsewhere classified: Secondary | ICD-10-CM | POA: Diagnosis not present

## 2014-06-12 DIAGNOSIS — M6281 Muscle weakness (generalized): Secondary | ICD-10-CM | POA: Diagnosis not present

## 2014-06-13 DIAGNOSIS — M6281 Muscle weakness (generalized): Secondary | ICD-10-CM | POA: Diagnosis not present

## 2014-06-13 DIAGNOSIS — M25519 Pain in unspecified shoulder: Secondary | ICD-10-CM | POA: Diagnosis not present

## 2014-06-13 DIAGNOSIS — R262 Difficulty in walking, not elsewhere classified: Secondary | ICD-10-CM | POA: Diagnosis not present

## 2014-06-13 DIAGNOSIS — R269 Unspecified abnormalities of gait and mobility: Secondary | ICD-10-CM | POA: Diagnosis not present

## 2014-06-13 NOTE — Telephone Encounter (Signed)
Noted  

## 2014-07-01 ENCOUNTER — Encounter: Payer: Self-pay | Admitting: Neurology

## 2014-07-01 ENCOUNTER — Ambulatory Visit (INDEPENDENT_AMBULATORY_CARE_PROVIDER_SITE_OTHER): Payer: Medicare Other | Admitting: Neurology

## 2014-07-01 VITALS — BP 114/69 | HR 83

## 2014-07-01 DIAGNOSIS — G20 Parkinson's disease: Secondary | ICD-10-CM

## 2014-07-01 DIAGNOSIS — I2510 Atherosclerotic heart disease of native coronary artery without angina pectoris: Secondary | ICD-10-CM | POA: Diagnosis not present

## 2014-07-01 NOTE — Progress Notes (Signed)
Subjective:    Patient ID: [name] [name] is a 79 y.o. male.  HPI:  Symptoms started in 2008, initially was confusion and memory loss. Initially diagnosed with Alzheimer's. They then noted a bilateral hand tremor, they are unsure if it is rest or action. Started having difficulty walking around 3 years ago. Noted difficulty lifting his feet off the ground, has a lot of freezing of his steps. Multiple falls, will trip over his feet. With the walker can walk up to 30 feet. Recently finished a course of PT, finished around 3 weeks ago. Requesting referral for home PT. Legs give out. Denies any current light headed sensation upon standing. Had this in the past but it resolved with discontinuation of BP atenolol. Notes difficulty with constipation.   Feel memory is overall stable.   Has difficulty falling asleep and staying asleep. 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 Takes klonopin which has calmed this down. Wife notes a stridor type noise when he is sleeping. Denies any loss of sense of smell.   Currently taking Sinemet 25-100 4x a day at 10am, 2pm, 6pm and 10pm. Feels he gets some benefit from it. If he misses a dose or gets it later then he will have more difficulty moving.   History per prior notes [name] [name] is a very pleasant 79 yo RH gentleman who is seen on behalf of Dr. Brett Fairy. He is accompanied by his wife today. He has an underlying complex history including parkinsonism, dementia, anxiety, OSA, restless leg syndrome, hearing loss, hypertension, heart disease with status post MI and status post CABG, atrial fibrillation, history of syncope, insomnia, history of colon cancer, asthma, spinal stenosis, thyroid disease, arthritis, and has had recurrent falls. He started seeing Dr. Brett Fairy I believe in 2011 and was most recently seen by our nurse practitioner in February 2014, at which time he was continued on Sinemet 3 times a day and advised to reduce prednisone. His wife called today  stating that to him he needed to be seen because of recurrent falls. His appointment was canceled for last week and he was not yet rescheduled for another appointment. He was seen in the emergency room on 05/15/2013 after a fall. He had fallen backwards hitting his head on concrete while trying to get into his car. His wife also reported increasing confusion over the past for 5 days prior to your ED visit. He had a CT head which showed stable atrophy and white matter changes and no acute abnormality. He had chest x-ray which was negative for pneumonia. Laboratory function showed normal electrolytes, normal CBC, normal urinalysis. His wife states, that is legs give out and he has a tendency to fall backwards. He has had PT before and last went in November. PT has helped in the past. She would like a referral for PT again. He went to PT and speech rehab on [employer] [employer] [phone]).  He has RLS, RBD, OSA on CPAP. He has a very erratic sleep wake schedule. He and his wife go to bed late around 2 AM and he wakes up at 10, but goes back to sleep and over the course of the day, he takes at least 2 three-hour naps. He uses CPAP at night, but not for his naps.  C/L is helpful, but he does not usually takes the 3 doses. He often misses the last dose. He has no hallucinations.    His Past Medical History Is Significant For: Past Medical History  Diagnosis  Date  . ONYCHOMYCOSIS, TOENAILS 06/27/2008  . HYPERLIPIDEMIA 07/28/2007  . ANXIETY 09/28/2007  . OBSTRUCTIVE SLEEP APNEA 01/01/2008  . RESTLESS LEGS SYNDROME 12/26/2007  . HEARING LOSS 06/27/2008  . HYPERTENSION 07/28/2007  . MYOCARDIAL INFARCTION, HX OF 07/28/2007  . CORONARY ARTERY DISEASE 07/28/2007  . Atrial fibrillation 09/28/2007  . SINUSITIS- ACUTE-NOS 10/10/2007  . ALLERGIC RHINITIS 09/28/2007  . CHOLELITHIASIS 09/28/2007  . BENIGN PROSTATIC HYPERTROPHY 09/28/2007  . CELLULITIS, HAND, LEFT 09/24/2010  . SYNCOPE 07/28/2007  . INSOMNIA-SLEEP  DISORDER-UNSPEC 04/03/2009  . HYPERSOMNIA WITH SLEEP APNEA UNSPECIFIED 12/26/2007  . FATIGUE 01/01/2008  . DYSPNEA 04/03/2009  . Wheezing 11/06/2009  . ANGIOEDEMA 11/06/2009  . COLON CANCER, HX OF 07/28/2007  . PROSTATE CANCER, HX OF 07/28/2007  . COLONIC POLYPS, HX OF 07/28/2007  . Long term current use of anticoagulant 12/22/2010  . Asthma   . DEMENTIA   . Spinal stenosis   . History of atrial fibrillation   . Lumbar stenosis 10/30/2011  . CAD (coronary artery disease)     Severe three-vessel coronary disease. LIMA to the LAD patent. SVG to PDA patent. SVG to OM 3 patent. Preserved ejection fraction. 2008.  . Cancer   . Thyroid disease   . Arthritis   . Pneumonia   . 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 Sepsis with acute organ dysfunction 09/21/2011  . 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 Cervical disc disorder with radiculopathy of cervical region 04/15/2014  . 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 Prostatectomy  2001  . Cardioversion  2007  . Stress cardiolite  02/08/2007  . Schocardiograph  10/07/2006  . Right thyroid lobectomy    . Coronary artery bypass graft  1995    x 3  . Hernia repair  1998  . Colonoscopy  2010    last done 2010- clear  . Cardiac electrophysiology study and ablation    . Thyroidectomy, partial      His Family History Is Significant For: Family History  Problem Relation Age of Onset  . Coronary artery disease Father     before 37 yo  . Coronary artery disease Sister     before 48 yo  . Hypertension Other     His Social History Is Significant For: History   Social History  . Marital Status: Married    Spouse Name: Diane    Number of Children: 2  . Years of Education: 14   Occupational History  . retired Health visitor    Social  History Main Topics  . Smoking status: Never Smoker   . Smokeless tobacco: Never Used  . Alcohol Use: No  . Drug Use: No  . Sexual Activity: None   Other Topics Concern  . None   Social History Narrative   Patient is married (Diane) and lives at home with his wife.   Patient has 7 children.   Patient is retired.   Patient has a Associates degree.   Patient is right-handed.   Patient does not drink any caffeine.    His Allergies Are:  Allergies  Allergen Reactions  . Lisinopril Swelling  . Statins     parkinsons   . Thorazine [Chlorpromazine]     Confusion/loopy  :   His Current Medications Are:  Outpatient Encounter Prescriptions as of 07/01/2014  Medication  Sig  . albuterol (PROAIR HFA) 108 (90 BASE) MCG/ACT inhaler Inhale 2 puffs into the lungs every 4 (four) hours as needed. For wheezing  . ALPRAZolam (XANAX) 0.5 MG tablet Take 0.5 mg by mouth. As needed  . amLODipine (NORVASC) 5 MG tablet Take 1 tablet (5 mg total) by mouth daily.  Marland Kitchen apixaban (ELIQUIS) 5 MG TABS tablet Take 1 tablet (5 mg total) by mouth 2 (two) times daily.  Marland Kitchen buPROPion (WELLBUTRIN XL) 150 MG 24 hr tablet TAKE 1 TABLET AT BEDTIME  . carbidopa-levodopa (SINEMET IR) 25-100 MG per tablet TAKE 1 TABLET 4 TIMES DAILYAT 10AM, 2PM. 6PM AND 10PM (PER LAST OFFICE VISIT     NOTE)  . Cholecalciferol (VITAMIN D3) 2000 UNITS TABS Take 2 tablets by mouth daily.   . clonazePAM (KLONOPIN) 1 MG tablet Take 2 mg by mouth every evening. Can take 1/2 tablet more, if having difficulty sleeping.  . Coenzyme Q10 (CO Q 10 PO) Take 1 tablet by mouth at bedtime.  Marland Kitchen desloratadine (CLARINEX) 5 MG tablet Take 1 tablet (5 mg total) by mouth daily.  Marland Kitchen docusate sodium (COLACE) 100 MG capsule Take 200 mg by mouth at bedtime.  . donepezil (ARICEPT) 23 MG TABS tablet TAKE 1 TABLET EVERY MORNING  . FLUoxetine (PROZAC) 40 MG capsule Take 1 capsule (40 mg total) by mouth every morning.  . fluticasone (VERAMYST) 27.5 MCG/SPRAY nasal spray  Place 1 spray into the nose 2 (two) times daily.  . Fluticasone-Salmeterol (ADVAIR) 250-50 MCG/DOSE AEPB Inhale 1 puff into the lungs every 12 (twelve) hours. As needed  . guaiFENesin (ROBITUSSIN) 100 MG/5ML liquid Take 200 mg by mouth 4 (four) times daily as needed for cough.  Marland Kitchen ibuprofen (ADVIL,MOTRIN) 200 MG tablet Take 400 mg by mouth every 6 (six) hours as needed for pain.  Marland Kitchen L-Methylfolate-B12-B6-B2 (CEREFOLIN) 04-08-49-5 MG TABS TAKE 1 TABLET DAILY  . levocetirizine (XYZAL) 5 MG tablet Take 1 tablet (5 mg total) by mouth every evening.  . Multiple Vitamin (MULTIVITAMINS PO) Take 1 tablet by mouth at bedtime.   . Olopatadine HCl (PATADAY) 0.2 % SOLN Apply to eye as needed.   . primidone (MYSOLINE) 50 MG tablet TAKE 1 TABLET TWICE A DAY  AS DIRECTED  . solifenacin (VESICARE) 5 MG tablet Take 10 mg by mouth daily.  . [DISCONTINUED] guaifenesin (HUMIBID E) 400 MG TABS Take 400 mg by mouth every 4 (four) hours as needed. For chest congestion  . [DISCONTINUED] predniSONE (DELTASONE) 10 MG tablet 1 tabs by mouth per day for 4 wks  . [DISCONTINUED] TURMERIC PO Take 250 mg by mouth daily.  . [DISCONTINUED] chlorproMAZINE (THORAZINE) 25 MG tablet Take 1 tablet (25 mg total) by mouth 4 (four) times daily as needed.  . [DISCONTINUED] Diclofenac Sodium (PENNSAID) 2 % SOLN Place 2 application onto the skin 2 (two) times daily.   Review of Systems  Constitutional: Positive for fatigue.  HENT: Positive for hearing loss, rhinorrhea, trouble swallowing and tinnitus.   Respiratory: Positive for shortness of breath and wheezing.        Uses a CPAP  Gastrointestinal: Positive for constipation.  Musculoskeletal: Positive for myalgias and arthralgias.  Allergic/Immunologic: Positive for environmental allergies.  Neurological: Positive for tremors.       Memory loss Restless leg  Hematological: Bruises/bleeds easily.  Psychiatric/Behavioral: Positive for confusion, sleep disturbance and dysphoric mood. The  patient is nervous/anxious.     Objective:  Neurologic Exam  Physical Exam Physical Examination:   Filed Vitals:  07/01/14 1418  BP: 114/69  Pulse: 83   His repeat sitting blood pressure was 121/67 with a pulse of 57 and standing blood pressure was 129/68 with a pulse of 60. He did not report any lightheadedness upon standing.  General Examination: The patient is a very pleasant 79 y.o. male in no acute distress.  HEENT: Normocephalic, atraumatic, pupils are equal, round and reactive to light and accommodation. Funduscopic exam is normal with sharp disc margins noted. Extraocular tracking shows mild saccadic breakdown without nystagmus noted. There is limitation to upper gaze. There is mild decrease in eye blink rate. Hearing is intact. Tympanic membranes are clear bilaterally. Face is symmetric with mild facial masking and normal facial sensation. There is no lip, neck or jaw tremor. Neck is moderately rigid with intact passive ROM. There are no carotid bruits on auscultation. Tongue protrudes centrally and palate elevates symmetrically.   There is no drooling.   Chest: is clear to auscultation without wheezing, rhonchi or crackles noted.  Heart: sounds are regular and normal without murmurs, rubs or gallops noted.   Abdomen: is soft, non-tender and non-distended with normal bowel sounds appreciated on auscultation.  Extremities: There is no pitting edema in the distal lower extremities bilaterally. Pedal pulses are intact. There are no varicose veins and there are scars from vein harvesting.  Skin: is warm and dry with no trophic changes noted. Age-related changes are noted on the skin.   Musculoskeletal: exam reveals no obvious joint deformities, tenderness, joint swelling or erythema.  Neurologically:  Mental status: The patient is awake and alert, paying good  attention. He is able to partially provide the history. His wife provides details of the history. He is oriented to:  person, place, situation and year. His memory, attention, language and knowledge are mildly impaired. There is a mild degree of bradyphrenia. Speech is moderately hypophonic with no dysarthria noted. Mood is congruent and affect is normal.   Cranial nerves are as described above under HEENT exam. In addition, shoulder shrug is normal with equal shoulder height noted.  Motor exam: Normal bulk, and strength for age is noted. There are no dyskinesias noted.   Tone is mildly rigid with absence of cogwheeling. There is overall moderate bradykinesia. There is no drift or rebound.  There is a slight intermittent resting tremor in the left thumb only.  Romberg is not tested d/t fall risk.   Reflexes are 1+ in the upper extremities and absent in the lower extremities.   Fine motor skills exam: Finger taps are moderatelyimpaired on the right and left. Hand movements are moderately impaired on the right left. RAP (rapid alternating patting) is moderately impaired bilaterally. Foot taps are moderately impaired on the right and moderately impaired on the left. Foot agility (in the form of heel stomping) is moderately impaired on the right and moderately impaired on the left.   Brief myoclonic jerks of bilateral UE noted  Cerebellar testing shows no dysmetria or intention tremor on finger to nose testing. Heel to shin is unremarkable bilaterally. There is no truncal or gait ataxia.   Sensory exam is intact to light touch, pinprick, vibration, temperature sense and proprioception in the upper and lower extremities.   Gait, station and balance: He requires assistance to stands up from the seated position from his rolling walker with seat with moderate difficulty. Requires assistance to walk No veering to one side is noted. He is not noted to lean to the side. Posture is moderately stooped. Stance is  wide-based. He walks with decrease in stride length and pace and decreased arm swing on the left. Tandem walk is not  possible. Balance is moderately impaired. He is not able to do a toe or heel stance.      Assessment and Plan:   1)Parkinsonism 2)Gait instability  In summary, OLUWASEUN CREMER is a very pleasant 79 y.o.-year old male with a complicated medical Hx who has parkinsonism, possibly left-sided predominant Parkinson's disease, memory loss, gait disorder, likely multifactorial in nature. His physical exam appears to be fairly stable. 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 Based on clinical history, time course and symptoms this could be consistent with a MSA type picture. Based on his + response to Sinemet I would also consider idiopathic PD, though MSA patients will also often have a less robust response to L-dopa. Will increase his SInemet 25-100 to 1.5 tablets 4x a day. Will refer for home PT. In the future he may benefit from neuro-rehab referral for gait training/Parkinson's specific PT. Follow up with Dr Brett Fairy in 4 months.

## 2014-07-01 NOTE — Patient Instructions (Signed)
Overall you are doing fairly well but I do want to suggest a few things today:   Remember to drink plenty of fluid, eat healthy meals and do not skip any meals. Try to eat protein with a every meal and eat a healthy snack such as fruit or nuts in between meals. Try to keep a regular sleep-wake schedule and try to exercise daily, particularly in the form of walking, 20-30 minutes a day, if you can.   As far as your medications are concerned, I would like to suggest the following: 1)Please increase your Sinemet to 1.5 tablets four times a day  I will refer you for home physical therapy. In the future you may benefit from Parkinson's specific physical therapy.   Please schedule a follow up with Dr Brett Fairy for January 2016.  Please call us with any interim questions, concerns, problems, updates or refill requests.   My clinical assistant and will answer any of your questions and relay your messages to me and also relay most of my messages to you.   Our phone number is [phone]. We also have an after hours call service for urgent matters and there is a physician on-call for urgent questions. For any emergencies you know to call 911 or go to the nearest emergency room

## 2014-07-09 ENCOUNTER — Other Ambulatory Visit: Payer: Self-pay

## 2014-07-09 MED ORDER — ALPRAZOLAM 0.5 MG PO TABS: 0.5000 mg | ORAL_TABLET | Freq: Every day | ORAL | Status: DC | PRN

## 2014-07-09 MED ORDER — LEVOCETIRIZINE DIHYDROCHLORIDE 5 MG PO TABS: 5.0000 mg | ORAL_TABLET | Freq: Every evening | ORAL | Status: DC

## 2014-07-09 MED ORDER — BUPROPION HCL ER (XL) 150 MG PO TB24: 150.0000 mg | ORAL_TABLET | Freq: Every day | ORAL | Status: DC

## 2014-07-09 MED ORDER — FLUOXETINE HCL 40 MG PO CAPS: 40.0000 mg | ORAL_CAPSULE | ORAL | Status: DC

## 2014-07-09 NOTE — Telephone Encounter (Signed)
Faxed hardcopy for Alprazolam to [facility].

## 2014-07-09 NOTE — Telephone Encounter (Signed)
Done hardcopy to robin  

## 2014-07-30 ENCOUNTER — Ambulatory Visit (INDEPENDENT_AMBULATORY_CARE_PROVIDER_SITE_OTHER): Payer: Medicare Other | Admitting: Internal Medicine

## 2014-07-30 ENCOUNTER — Encounter: Payer: Self-pay | Admitting: Internal Medicine

## 2014-07-30 VITALS — BP 140/74 | HR 83 | Temp 99.0°F | Wt 223.8 lb

## 2014-07-30 DIAGNOSIS — I10 Essential (primary) hypertension: Secondary | ICD-10-CM | POA: Diagnosis not present

## 2014-07-30 DIAGNOSIS — J45901 Unspecified asthma with (acute) exacerbation: Secondary | ICD-10-CM | POA: Diagnosis not present

## 2014-07-30 DIAGNOSIS — I2510 Atherosclerotic heart disease of native coronary artery without angina pectoris: Secondary | ICD-10-CM | POA: Diagnosis not present

## 2014-07-30 DIAGNOSIS — Z23 Encounter for immunization: Secondary | ICD-10-CM

## 2014-07-30 DIAGNOSIS — J209 Acute bronchitis, unspecified: Secondary | ICD-10-CM | POA: Insufficient documentation

## 2014-07-30 MED ORDER — PREDNISONE 10 MG PO TABS: ORAL_TABLET | ORAL | Status: DC

## 2014-07-30 MED ORDER — METHYLPREDNISOLONE ACETATE 80 MG/ML IJ SUSP
80.0000 mg | Freq: Once | INTRAMUSCULAR | Status: AC
  Administered 2014-07-30: 80 mg via INTRAMUSCULAR

## 2014-07-30 MED ORDER — AMLODIPINE BESYLATE 5 MG PO TABS: 5.0000 mg | ORAL_TABLET | Freq: Every day | ORAL | Status: DC

## 2014-07-30 MED ORDER — LEVOFLOXACIN 250 MG PO TABS: 250.0000 mg | ORAL_TABLET | Freq: Every day | ORAL | Status: DC

## 2014-07-30 NOTE — Patient Instructions (Addendum)
You had the flu shot today. And the steroid shot today  Please take all new medication as prescribed - the antibiotic, and prednisone  Please continue all other medications as before, including the inhalers  Please have the pharmacy call with any other refills you may need.  Please keep your appointments with your specialists as you may have planned  OK to cancel your Oct 2015 appt, with followup at 6 months

## 2014-07-30 NOTE — Progress Notes (Signed)
Pre visit review using our clinic review tool, if applicable. No additional management support is needed unless otherwise documented below in the visit note. 

## 2014-07-30 NOTE — Progress Notes (Signed)
Subjective:    Patient ID: [name] [name], male    DOB: Mar 30, 1935, 79 y.o.   MRN: [id number]  HPI  Here with acute onset mild to mod 2-3 days ST, HA, general weakness and malaise, with prod cough greenish sputum, but Pt denies chest pain, increased sob or doe, wheezing, orthopnea, PND, increased LE swelling, palpitations, dizziness or syncope, except for mild wheezing and sob/doe mild worse last few days.  Past Medical History  Diagnosis Date  . ONYCHOMYCOSIS, TOENAILS 06/27/2008  . HYPERLIPIDEMIA 07/28/2007  . ANXIETY 09/28/2007  . OBSTRUCTIVE SLEEP APNEA 01/01/2008  . RESTLESS LEGS SYNDROME 12/26/2007  . HEARING LOSS 06/27/2008  . HYPERTENSION 07/28/2007  . MYOCARDIAL INFARCTION, HX OF 07/28/2007  . CORONARY ARTERY DISEASE 07/28/2007  . Atrial fibrillation 09/28/2007  . SINUSITIS- ACUTE-NOS 10/10/2007  . ALLERGIC RHINITIS 09/28/2007  . CHOLELITHIASIS 09/28/2007  . BENIGN PROSTATIC HYPERTROPHY 09/28/2007  . CELLULITIS, HAND, LEFT 09/24/2010  . SYNCOPE 07/28/2007  . INSOMNIA-SLEEP DISORDER-UNSPEC 04/03/2009  . HYPERSOMNIA WITH SLEEP APNEA UNSPECIFIED 12/26/2007  . FATIGUE 01/01/2008  . DYSPNEA 04/03/2009  . Wheezing 11/06/2009  . ANGIOEDEMA 11/06/2009  . COLON CANCER, HX OF 07/28/2007  . PROSTATE CANCER, HX OF 07/28/2007  . COLONIC POLYPS, HX OF 07/28/2007  . Long term current use of anticoagulant 12/22/2010  . Asthma   . DEMENTIA   . Spinal stenosis   . History of atrial fibrillation   . Lumbar stenosis 10/30/2011  . CAD (coronary artery disease)     Severe three-vessel coronary disease. LIMA to the LAD patent. SVG to PDA patent. SVG to OM 3 patent. Preserved ejection fraction. 2008.  . Cancer   . Thyroid disease   . Arthritis   . Pneumonia   . 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 Sepsis with acute organ dysfunction 09/21/2011  . 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 Cervical disc disorder with radiculopathy  of cervical region 04/15/2014  . 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 Prostatectomy  2001  . Cardioversion  2007  . Stress cardiolite  02/08/2007  . Schocardiograph  10/07/2006  . Right thyroid lobectomy    . Coronary artery bypass graft  1995    x 3  . Hernia repair  1998  . Colonoscopy  2010    last done 2010- clear  . Cardiac electrophysiology study and ablation    . Thyroidectomy, partial      reports that he has never smoked. He has never used smokeless tobacco. He reports that he does not drink alcohol or use illicit drugs. family history includes Coronary artery disease in his father and sister; Hypertension in his other. Allergies  Allergen Reactions  . Lisinopril Swelling  . Statins     parkinsons   . Thorazine [Chlorpromazine]     Confusion/loopy   Current Outpatient Prescriptions on File Prior to Visit  Medication Sig Dispense Refill  . albuterol (PROAIR HFA) 108 (90 BASE) MCG/ACT inhaler Inhale 2 puffs into the lungs every 4 (four) hours as needed. For wheezing  3 Inhaler  3  . ALPRAZolam (XANAX) 0.5 MG tablet Take 1 tablet (0.5 mg total) by mouth daily as needed for anxiety. As needed  90 tablet  1  . apixaban (ELIQUIS) 5 MG TABS tablet Take 1 tablet (5 mg total) by mouth 2 (two) times daily.  180 tablet  3  .  buPROPion (WELLBUTRIN XL) 150 MG 24 hr tablet Take 1 tablet (150 mg total) by mouth daily.  90 tablet  3  . carbidopa-levodopa (SINEMET IR) 25-100 MG per tablet TAKE 1 TABLET 4 TIMES DAILYAT 10AM, 2PM. 6PM AND 10PM (PER LAST OFFICE VISIT     NOTE)  360 tablet  1  . Cholecalciferol (VITAMIN D3) 2000 UNITS TABS Take 2 tablets by mouth daily.       . clonazePAM (KLONOPIN) 1 MG tablet Take 2 mg by mouth every evening. Can take 1/2 tablet more, if having difficulty sleeping.      . Coenzyme Q10 (CO Q 10 PO) Take 1 tablet by mouth at bedtime.      Marland Kitchen desloratadine (CLARINEX) 5 MG tablet Take 1 tablet  (5 mg total) by mouth daily.  90 tablet  3  . docusate sodium (COLACE) 100 MG capsule Take 200 mg by mouth at bedtime.      . donepezil (ARICEPT) 23 MG TABS tablet TAKE 1 TABLET EVERY MORNING  90 tablet  1  . FLUoxetine (PROZAC) 40 MG capsule Take 1 capsule (40 mg total) by mouth every morning.  90 capsule  3  . fluticasone (VERAMYST) 27.5 MCG/SPRAY nasal spray Place 1 spray into the nose 2 (two) times daily.      . Fluticasone-Salmeterol (ADVAIR) 250-50 MCG/DOSE AEPB Inhale 1 puff into the lungs every 12 (twelve) hours. As needed      . guaiFENesin (ROBITUSSIN) 100 MG/5ML liquid Take 200 mg by mouth 4 (four) times daily as needed for cough.      Marland Kitchen ibuprofen (ADVIL,MOTRIN) 200 MG tablet Take 400 mg by mouth every 6 (six) hours as needed for pain.      Marland Kitchen L-Methylfolate-B12-B6-B2 (CEREFOLIN) 04-08-49-5 MG TABS TAKE 1 TABLET DAILY  90 each  3  . levocetirizine (XYZAL) 5 MG tablet Take 1 tablet (5 mg total) by mouth every evening.  90 tablet  3  . Multiple Vitamin (MULTIVITAMINS PO) Take 1 tablet by mouth at bedtime.       . Olopatadine HCl (PATADAY) 0.2 % SOLN Apply to eye as needed.       . primidone (MYSOLINE) 50 MG tablet TAKE 1 TABLET TWICE A DAY  AS DIRECTED  180 tablet  1  . solifenacin (VESICARE) 5 MG tablet Take 10 mg by mouth daily.       No current facility-administered medications on file prior to visit.   Review of Systems  Constitutional: Negative for unusual diaphoresis or other sweats  HENT: Negative for ringing in ear Eyes: Negative for double vision or worsening visual disturbance.  Respiratory: Negative for choking and stridor.   Gastrointestinal: Negative for vomiting or other signifcant bowel change Genitourinary: Negative for hematuria or decreased urine volume.  Musculoskeletal: Negative for other MSK pain or swelling Skin: Negative for color change and worsening wound.  Neurological: Negative for tremors and numbness other than noted  Psychiatric/Behavioral: Negative for  decreased concentration or agitation other than above       Objective:   Physical Exam BP 140/74  Pulse 83  Temp(Src) 99 F (37.2 C) (Oral)  Wt 223 lb 12 oz (101.492 kg)  SpO2 94% VS noted, mild ill Constitutional: Pt appears well-developed, well-nourished.  HENT: Head: NCAT.  Right Ear: External ear normal.  Left Ear: External ear normal.  Eyes: . Pupils are equal, round, and reactive to light. Conjunctivae and EOM are normal Neck: Normal range of motion. Neck supple.  Bilat tm's with mild  erythema.  Max sinus areas mild tender.  Pharynx with mild erythema, no exudate Cardiovascular: Normal rate and regular rhythm.   Pulmonary/Chest: Effort normal and breath sounds decr withj mild wheezing.  Neurological: Pt is alert. + confused, seems at baseline , motor grossly intact Skin: Skin is warm. No rash Psychiatric: Pt behavior is normal. No agitation.     Assessment & Plan:

## 2014-07-30 NOTE — Assessment & Plan Note (Signed)
Mild to mod, for antibx course,  to f/u any worsening symptoms or concerns 

## 2014-07-30 NOTE — Assessment & Plan Note (Signed)
Mild to mod, for depomedrol IM, predpac asd,,  to f/u any worsening symptoms or concerns 

## 2014-07-30 NOTE — Assessment & Plan Note (Signed)
stable overall by history and exam, recent data reviewed with pt, and pt to continue medical treatment as before,  to f/u any worsening symptoms or concerns BP Readings from Last 3 Encounters:  07/30/14 140/74  07/01/14 114/69  05/17/14 132/68

## 2014-08-13 ENCOUNTER — Telehealth: Payer: Medicare Other | Admitting: *Deleted

## 2014-08-13 DIAGNOSIS — R269 Unspecified abnormalities of gait and mobility: Secondary | ICD-10-CM

## 2014-08-13 DIAGNOSIS — G20 Parkinson's disease: Secondary | ICD-10-CM

## 2014-08-13 NOTE — Telephone Encounter (Signed)
Patient wife calling in to request in-home physical therapy for patient, please advise.

## 2014-08-15 NOTE — Telephone Encounter (Signed)
PT in home ordered,

## 2014-08-16 DIAGNOSIS — I10 Essential (primary) hypertension: Secondary | ICD-10-CM | POA: Diagnosis not present

## 2014-08-16 DIAGNOSIS — J45909 Unspecified asthma, uncomplicated: Secondary | ICD-10-CM | POA: Diagnosis not present

## 2014-08-16 DIAGNOSIS — I2510 Atherosclerotic heart disease of native coronary artery without angina pectoris: Secondary | ICD-10-CM | POA: Diagnosis not present

## 2014-08-16 DIAGNOSIS — G20 Parkinson's disease: Secondary | ICD-10-CM | POA: Diagnosis not present

## 2014-08-16 DIAGNOSIS — R296 Repeated falls: Secondary | ICD-10-CM | POA: Diagnosis not present

## 2014-08-16 DIAGNOSIS — Z9181 History of falling: Secondary | ICD-10-CM | POA: Diagnosis not present

## 2014-08-20 DIAGNOSIS — I2510 Atherosclerotic heart disease of native coronary artery without angina pectoris: Secondary | ICD-10-CM | POA: Diagnosis not present

## 2014-08-20 DIAGNOSIS — R296 Repeated falls: Secondary | ICD-10-CM | POA: Diagnosis not present

## 2014-08-20 DIAGNOSIS — G20 Parkinson's disease: Secondary | ICD-10-CM | POA: Diagnosis not present

## 2014-08-20 DIAGNOSIS — I10 Essential (primary) hypertension: Secondary | ICD-10-CM | POA: Diagnosis not present

## 2014-08-20 DIAGNOSIS — Z9181 History of falling: Secondary | ICD-10-CM | POA: Diagnosis not present

## 2014-08-20 DIAGNOSIS — J45909 Unspecified asthma, uncomplicated: Secondary | ICD-10-CM | POA: Diagnosis not present

## 2014-08-21 ENCOUNTER — Telehealth: Payer: Self-pay | Admitting: Neurology

## 2014-08-21 NOTE — Telephone Encounter (Signed)
[name] [name] with [facility] @ [phone], requesting an order for Speech Therapy.  Please call and advise.

## 2014-08-21 NOTE — Telephone Encounter (Signed)
[name] [name], please call and give her verbal order for speech therapy.

## 2014-08-22 NOTE — Telephone Encounter (Signed)
Called and spoke to [place] at [facility] told her ok for Speech Therapy. Becky understood and will proceed.

## 2014-08-26 ENCOUNTER — Telehealth: Payer: Self-pay | Admitting: *Deleted

## 2014-08-26 DIAGNOSIS — I2510 Atherosclerotic heart disease of native coronary artery without angina pectoris: Secondary | ICD-10-CM | POA: Diagnosis not present

## 2014-08-26 DIAGNOSIS — R296 Repeated falls: Secondary | ICD-10-CM | POA: Diagnosis not present

## 2014-08-26 DIAGNOSIS — I10 Essential (primary) hypertension: Secondary | ICD-10-CM | POA: Diagnosis not present

## 2014-08-26 DIAGNOSIS — G20 Parkinson's disease: Secondary | ICD-10-CM | POA: Diagnosis not present

## 2014-08-26 DIAGNOSIS — Z9181 History of falling: Secondary | ICD-10-CM | POA: Diagnosis not present

## 2014-08-26 DIAGNOSIS — J45909 Unspecified asthma, uncomplicated: Secondary | ICD-10-CM | POA: Diagnosis not present

## 2014-08-26 MED ORDER — CARBIDOPA-LEVODOPA 25-100 MG PO TABS: 1.5000 | ORAL_TABLET | Freq: Four times a day (QID) | ORAL | Status: DC

## 2014-08-26 NOTE — Telephone Encounter (Signed)
Rx has been sent  

## 2014-08-26 NOTE — Telephone Encounter (Signed)
I called about PT [facility] and they are receiving this.  She asked about ST, I relayed that [place] with [facility] aware ST ok by [place].  Awaiting insurance approval.  She asked about needing refill on sinemet taking 1.5 tabs po qid.  I would send to [facility] in pharm for refill mail order pharm.  (last Dr. Janann Colonel note 06/2014)

## 2014-08-27 DIAGNOSIS — G20 Parkinson's disease: Secondary | ICD-10-CM | POA: Diagnosis not present

## 2014-08-28 DIAGNOSIS — J45909 Unspecified asthma, uncomplicated: Secondary | ICD-10-CM | POA: Diagnosis not present

## 2014-08-28 DIAGNOSIS — I10 Essential (primary) hypertension: Secondary | ICD-10-CM | POA: Diagnosis not present

## 2014-08-28 DIAGNOSIS — G20 Parkinson's disease: Secondary | ICD-10-CM | POA: Diagnosis not present

## 2014-08-28 DIAGNOSIS — Z9181 History of falling: Secondary | ICD-10-CM | POA: Diagnosis not present

## 2014-08-28 DIAGNOSIS — I2510 Atherosclerotic heart disease of native coronary artery without angina pectoris: Secondary | ICD-10-CM | POA: Diagnosis not present

## 2014-08-28 DIAGNOSIS — R296 Repeated falls: Secondary | ICD-10-CM | POA: Diagnosis not present

## 2014-08-29 ENCOUNTER — Ambulatory Visit: Payer: Medicare Other | Admitting: Internal Medicine

## 2014-08-29 DIAGNOSIS — Z9181 History of falling: Secondary | ICD-10-CM | POA: Diagnosis not present

## 2014-08-29 DIAGNOSIS — R296 Repeated falls: Secondary | ICD-10-CM | POA: Diagnosis not present

## 2014-08-29 DIAGNOSIS — I10 Essential (primary) hypertension: Secondary | ICD-10-CM | POA: Diagnosis not present

## 2014-08-29 DIAGNOSIS — G20 Parkinson's disease: Secondary | ICD-10-CM | POA: Diagnosis not present

## 2014-08-29 DIAGNOSIS — I2510 Atherosclerotic heart disease of native coronary artery without angina pectoris: Secondary | ICD-10-CM | POA: Diagnosis not present

## 2014-08-29 DIAGNOSIS — J45909 Unspecified asthma, uncomplicated: Secondary | ICD-10-CM | POA: Diagnosis not present

## 2014-09-02 ENCOUNTER — Emergency Department (HOSPITAL_COMMUNITY)
Admission: EM | Admit: 2014-09-02 | Discharge: 2014-09-02 | Disposition: A | Discharge status: Home / Self Care | Payer: Medicare Other | Attending: Emergency Medicine | Admitting: Emergency Medicine

## 2014-09-02 ENCOUNTER — Emergency Department (HOSPITAL_COMMUNITY): Payer: Medicare Other

## 2014-09-02 ENCOUNTER — Encounter (HOSPITAL_COMMUNITY): Payer: Self-pay | Admitting: Emergency Medicine

## 2014-09-02 DIAGNOSIS — S5002XA Contusion of left elbow, initial encounter: Secondary | ICD-10-CM | POA: Diagnosis not present

## 2014-09-02 DIAGNOSIS — I2510 Atherosclerotic heart disease of native coronary artery without angina pectoris: Secondary | ICD-10-CM | POA: Insufficient documentation

## 2014-09-02 DIAGNOSIS — Z872 Personal history of diseases of the skin and subcutaneous tissue: Secondary | ICD-10-CM | POA: Diagnosis not present

## 2014-09-02 DIAGNOSIS — Z7951 Long term (current) use of inhaled steroids: Secondary | ICD-10-CM | POA: Diagnosis not present

## 2014-09-02 DIAGNOSIS — Z86010 Personal history of colonic polyps: Secondary | ICD-10-CM | POA: Diagnosis not present

## 2014-09-02 DIAGNOSIS — Z859 Personal history of malignant neoplasm, unspecified: Secondary | ICD-10-CM | POA: Diagnosis not present

## 2014-09-02 DIAGNOSIS — M1990 Unspecified osteoarthritis, unspecified site: Secondary | ICD-10-CM | POA: Diagnosis not present

## 2014-09-02 DIAGNOSIS — Y9289 Other specified places as the place of occurrence of the external cause: Secondary | ICD-10-CM | POA: Diagnosis not present

## 2014-09-02 DIAGNOSIS — Z8701 Personal history of pneumonia (recurrent): Secondary | ICD-10-CM | POA: Insufficient documentation

## 2014-09-02 DIAGNOSIS — Z8619 Personal history of other infectious and parasitic diseases: Secondary | ICD-10-CM | POA: Diagnosis not present

## 2014-09-02 DIAGNOSIS — Z8546 Personal history of malignant neoplasm of prostate: Secondary | ICD-10-CM | POA: Diagnosis not present

## 2014-09-02 DIAGNOSIS — Z8719 Personal history of other diseases of the digestive system: Secondary | ICD-10-CM | POA: Diagnosis not present

## 2014-09-02 DIAGNOSIS — S0990XA Unspecified injury of head, initial encounter: Secondary | ICD-10-CM | POA: Insufficient documentation

## 2014-09-02 DIAGNOSIS — F419 Anxiety disorder, unspecified: Secondary | ICD-10-CM | POA: Insufficient documentation

## 2014-09-02 DIAGNOSIS — J45909 Unspecified asthma, uncomplicated: Secondary | ICD-10-CM | POA: Diagnosis not present

## 2014-09-02 DIAGNOSIS — R251 Tremor, unspecified: Secondary | ICD-10-CM | POA: Insufficient documentation

## 2014-09-02 DIAGNOSIS — I252 Old myocardial infarction: Secondary | ICD-10-CM | POA: Insufficient documentation

## 2014-09-02 DIAGNOSIS — H9190 Unspecified hearing loss, unspecified ear: Secondary | ICD-10-CM | POA: Diagnosis not present

## 2014-09-02 DIAGNOSIS — Z79899 Other long term (current) drug therapy: Secondary | ICD-10-CM | POA: Insufficient documentation

## 2014-09-02 DIAGNOSIS — Y9301 Activity, walking, marching and hiking: Secondary | ICD-10-CM | POA: Insufficient documentation

## 2014-09-02 DIAGNOSIS — W01198A Fall on same level from slipping, tripping and stumbling with subsequent striking against other object, initial encounter: Secondary | ICD-10-CM | POA: Insufficient documentation

## 2014-09-02 DIAGNOSIS — Z951 Presence of aortocoronary bypass graft: Secondary | ICD-10-CM | POA: Insufficient documentation

## 2014-09-02 DIAGNOSIS — I10 Essential (primary) hypertension: Secondary | ICD-10-CM | POA: Insufficient documentation

## 2014-09-02 DIAGNOSIS — N400 Enlarged prostate without lower urinary tract symptoms: Secondary | ICD-10-CM | POA: Insufficient documentation

## 2014-09-02 DIAGNOSIS — E785 Hyperlipidemia, unspecified: Secondary | ICD-10-CM | POA: Diagnosis not present

## 2014-09-02 DIAGNOSIS — R030 Elevated blood-pressure reading, without diagnosis of hypertension: Secondary | ICD-10-CM | POA: Diagnosis not present

## 2014-09-02 DIAGNOSIS — Z7901 Long term (current) use of anticoagulants: Secondary | ICD-10-CM | POA: Insufficient documentation

## 2014-09-02 DIAGNOSIS — Z7902 Long term (current) use of antithrombotics/antiplatelets: Secondary | ICD-10-CM | POA: Insufficient documentation

## 2014-09-02 DIAGNOSIS — Z85038 Personal history of other malignant neoplasm of large intestine: Secondary | ICD-10-CM | POA: Diagnosis not present

## 2014-09-02 DIAGNOSIS — G3183 Dementia with Lewy bodies: Secondary | ICD-10-CM | POA: Diagnosis not present

## 2014-09-02 NOTE — ED Notes (Signed)
EMS: States was called to patient's home. State patient fell while walking backwards with walker. They state no shortening of Lower extremities and no LOC. They state hematoma of left elbow.  No meds given en route.  BP 156/86 HR 80 RR 18 SpO2 95%  CBG 102  Per PATIENT: Patient states that he was walking backwards with walker from couch and said his leg gave out form under him. States he fell between two chairs. States he hit is head on the left side with bruising to his left elbow.

## 2014-09-02 NOTE — Discharge Instructions (Signed)
Head Injury You have received a head injury. It should be easy to awaken from sleeping. Sometimes it is necessary for you to stay in the emergency department for a while for observation. Sometimes admission to the hospital may be needed. After injuries such as yours, most problems occur within the first 24 hours, but side effects may occur up to 7-10 days after the injury. It is important for you to carefully monitor your condition and contact your health care provider or seek immediate medical care if there is a change in your condition. WHAT ARE THE TYPES OF HEAD INJURIES? Head injuries can be as minor as a bump. Some head injuries can be more severe. More severe head injuries include:  A jarring injury to the brain (concussion).  A bruise of the brain (contusion). This mean there is bleeding in the brain that can cause swelling.  A cracked skull (skull fracture).  Bleeding in the brain that collects, clots, and forms a bump (hematoma). WHAT CAUSES A HEAD INJURY? A serious head injury is most likely to happen to someone who is in a car wreck and is not wearing a seat belt. Other causes of major head injuries include bicycle or motorcycle accidents, sports injuries, and falls. HOW ARE HEAD INJURIES DIAGNOSED? A complete history of the event leading to the injury and your current symptoms will be helpful in diagnosing head injuries. Many times, pictures of the brain, such as CT or MRI are needed to see the extent of the injury. Often, an overnight hospital stay is necessary for observation.  WHEN SHOULD I SEEK IMMEDIATE MEDICAL CARE?  You should get help right away if:  You have confusion or drowsiness.  You feel sick to your stomach (nauseous) or have continued, forceful vomiting.  You have dizziness or unsteadiness that is getting worse.  You have severe, continued headaches not relieved by medicine. Only take over-the-counter or prescription medicines for pain, fever, or discomfort as  directed by your health care provider.  You do not have normal function of the arms or legs or are unable to walk.  You notice changes in the black spots in the center of the colored part of your eye (pupil).  You have a clear or bloody fluid coming from your nose or ears.  You have a loss of vision. During the next 24 hours after the injury, you must stay with someone who can watch you for the warning signs. This person should contact local emergency services (911 in the [place] if you have seizures, you become unconscious, or you are unable to wake up. HOW CAN I PREVENT A HEAD INJURY IN THE FUTURE? The most important factor for preventing major head injuries is avoiding motor vehicle accidents. To minimize the potential for damage to your head, it is crucial to wear seat belts while riding in motor vehicles. Wearing helmets while bike riding and playing collision sports (like football) is also helpful. Also, avoiding dangerous activities around the house will further help reduce your risk of head injury.  WHEN CAN I RETURN TO NORMAL ACTIVITIES AND ATHLETICS? You should be reevaluated by your health care provider before returning to these activities. If you have any of the following symptoms, you should not return to activities or contact sports until 1 week after the symptoms have stopped:  Persistent headache.  Dizziness or vertigo.  Poor attention and concentration.  Confusion.  Memory problems.  Nausea or vomiting.  Fatigue or tire easily.  Irritability.  Intolerant of  bright lights or loud noises.  Anxiety or depression.  Disturbed sleep. MAKE SURE YOU:   Understand these instructions.  Will watch your condition.  Will get help right away if you are not doing well or get worse. Document Released: 10/25/2005 Document Revised: 10/30/2013 Document Reviewed: 07/02/2013 [facility] Patient Information 2015 [place], [place]. This information is not intended to replace advice  given to you by your health care provider. Make sure you discuss any questions you have with your health care provider.

## 2014-09-02 NOTE — ED Provider Notes (Signed)
CSN: [id number]     Arrival date & time 09/02/14  1517 History   First MD Initiated Contact with Patient 09/02/14 1519     Chief Complaint  Patient presents with  . Fall     (Consider location/radiation/quality/duration/timing/severity/associated sxs/prior Treatment) HPI Patient lost his balance backing up to sit down on the couch. This happens to him with some frequency. The patient has Parkinson's disease and uses a walker. As he fell he landed such that he struck his head on the left side against a chair. There was no loss of consciousness. The patient denies headache. However as he is on Eliquis and is advised to come to emergency department for risk of bleeding. Patient denies he has any neck pain. There is no paresthesia or increased weakness in the extremities relative to baseline for him with Parkinson's disease. Also he has developed a hematoma on his left lateral elbow. He however denies any decreased range of motion at any of the extremities.  Past Medical History  Diagnosis Date  . ONYCHOMYCOSIS, TOENAILS 06/27/2008  . HYPERLIPIDEMIA 07/28/2007  . ANXIETY 09/28/2007  . OBSTRUCTIVE SLEEP APNEA 01/01/2008  . RESTLESS LEGS SYNDROME 12/26/2007  . HEARING LOSS 06/27/2008  . HYPERTENSION 07/28/2007  . MYOCARDIAL INFARCTION, HX OF 07/28/2007  . CORONARY ARTERY DISEASE 07/28/2007  . Atrial fibrillation 09/28/2007  . SINUSITIS- ACUTE-NOS 10/10/2007  . ALLERGIC RHINITIS 09/28/2007  . CHOLELITHIASIS 09/28/2007  . BENIGN PROSTATIC HYPERTROPHY 09/28/2007  . CELLULITIS, HAND, LEFT 09/24/2010  . SYNCOPE 07/28/2007  . INSOMNIA-SLEEP DISORDER-UNSPEC 04/03/2009  . HYPERSOMNIA WITH SLEEP APNEA UNSPECIFIED 12/26/2007  . FATIGUE 01/01/2008  . DYSPNEA 04/03/2009  . Wheezing 11/06/2009  . ANGIOEDEMA 11/06/2009  . COLON CANCER, HX OF 07/28/2007  . PROSTATE CANCER, HX OF 07/28/2007  . COLONIC POLYPS, HX OF 07/28/2007  . Long term current use of anticoagulant 12/22/2010  . Asthma   . DEMENTIA   . Spinal  stenosis   . History of atrial fibrillation   . Lumbar stenosis 10/30/2011  . CAD (coronary artery disease)     Severe three-vessel coronary disease. LIMA to the LAD patent. SVG to PDA patent. SVG to OM 3 patent. Preserved ejection fraction. 2008.  . Cancer   . Thyroid disease   . Arthritis   . Pneumonia   . 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 Sepsis with acute organ dysfunction 09/21/2011  . 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 Cervical disc disorder with radiculopathy of cervical region 04/15/2014  . 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 Prostatectomy  2001  . Cardioversion  2007  . Stress cardiolite  02/08/2007  . Schocardiograph  10/07/2006  . Right thyroid lobectomy    . Coronary artery bypass graft  1995    x 3  . Hernia repair  1998  . Colonoscopy  2010    last done 2010- clear  . Cardiac electrophysiology study and ablation    . Thyroidectomy, partial     Family History  Problem Relation Age of Onset  . Coronary artery disease Father     before 97 yo  . Coronary artery disease Sister     before 7 yo  . Hypertension Other    History  Substance Use Topics  . Smoking status: Never Smoker   . Smokeless tobacco: Never Used  . Alcohol Use: No    Review of Systems  10 Systems reviewed and are negative for acute change except as noted in the HPI.    Allergies  Lisinopril; Statins; and Thorazine  Home Medications   Prior to Admission medications   Medication Sig Start Date End Date Taking? Authorizing Provider  albuterol (PROAIR HFA) 108 (90 BASE) MCG/ACT inhaler Inhale 2 puffs into the lungs every 4 (four) hours as needed. For wheezing 02/29/12  Yes Biagio Borg, MD  ALPRAZolam Duanne Moron) 0.5 MG tablet Take 0.5 mg by mouth daily as needed for anxiety. As needed 07/09/14  Yes Biagio Borg, MD  amLODipine (NORVASC) 5 MG  tablet Take 1 tablet (5 mg total) by mouth daily. 07/30/14  Yes Biagio Borg, MD  apixaban (ELIQUIS) 5 MG TABS tablet Take 1 tablet (5 mg total) by mouth 2 (two) times daily. 12/04/13  Yes Biagio Borg, MD  buPROPion (WELLBUTRIN XL) 150 MG 24 hr tablet Take 1 tablet (150 mg total) by mouth daily. 07/09/14  Yes Biagio Borg, MD  carbidopa-levodopa (SINEMET IR) 25-100 MG per tablet Take 1.5 tablets by mouth 4 (four) times daily. At [place], [place] AND 10PM 08/26/14  Yes Larey Seat, MD  Cholecalciferol (VITAMIN D3) 2000 UNITS TABS Take 2 tablets by mouth daily.    Yes Historical Provider, MD  clonazePAM (KLONOPIN) 1 MG tablet Take 2 mg by mouth every evening. Can take 1/2 tablet more, if having difficulty sleeping. 02/14/14  Yes Carmen Dohmeier, MD  Coenzyme Q10 (CO Q 10 PO) Take 1 tablet by mouth at bedtime.   Yes Historical Provider, MD  desloratadine (CLARINEX) 5 MG tablet Take 1 tablet (5 mg total) by mouth daily. 04/09/14  Yes Biagio Borg, MD  docusate sodium (COLACE) 100 MG capsule Take 300 mg by mouth at bedtime.    Yes Historical Provider, MD  donepezil (ARICEPT) 23 MG TABS tablet TAKE 1 TABLET EVERY MORNING 01/07/14  Yes Larey Seat, MD  FLUoxetine (PROZAC) 40 MG capsule Take 1 capsule (40 mg total) by mouth every morning. 07/09/14  Yes Biagio Borg, MD  fluticasone (VERAMYST) 27.5 MCG/SPRAY nasal spray Place 1 spray into the nose 2 (two) times daily. 02/29/12  Yes Biagio Borg, MD  Fluticasone-Salmeterol (ADVAIR) 250-50 MCG/DOSE AEPB Inhale 1 puff into the lungs every 12 (twelve) hours. As needed 10/12/13  Yes Biagio Borg, MD  guaiFENesin (ROBITUSSIN) 100 MG/5ML liquid Take 200 mg by mouth 4 (four) times daily as needed for cough.   Yes Historical Provider, MD  ibuprofen (ADVIL,MOTRIN) 200 MG tablet Take 400 mg by mouth every 6 (six) hours as needed for pain.   Yes Historical Provider, MD  L-Methylfolate-B12-B6-B2 (CEREFOLIN) 04-08-49-5 MG TABS TAKE 1 TABLET DAILY 03/05/14  Yes Biagio Borg, MD   levocetirizine (XYZAL) 5 MG tablet Take 1 tablet (5 mg total) by mouth every evening. 07/09/14  Yes Biagio Borg, MD  Multiple Vitamin (MULTIVITAMINS PO) Take 1 tablet by mouth at bedtime.    Yes Historical Provider, MD  Olopatadine HCl (PATADAY) 0.2 % SOLN Apply to eye as needed.    Yes Historical Provider, MD  primidone (MYSOLINE) 50 MG tablet TAKE 1 TABLET TWICE A DAY  AS DIRECTED   Yes Asencion Partridge Dohmeier, MD  solifenacin (VESICARE) 5 MG tablet Take 5 mg by mouth daily.   Yes Historical Provider, MD   BP 151/75  Pulse 73  Temp(Src) 98.6 F (37 C) (Oral)  Resp 22  SpO2 92% Physical Exam  Constitutional: He is oriented to person,  place, and time.  The patient is alert and interactive. He is in no acute distress. He is moderately deconditioned.  HENT:  Head: Normocephalic and atraumatic.  Right Ear: External ear normal.  Left Ear: External ear normal.  Nose: Nose normal.  Mouth/Throat: Oropharynx is clear and moist.  There is no evident hematoma at this point time however the patient does have a mildly tender area to palpation on the left parietal scalp.  Eyes: EOM are normal. Pupils are equal, round, and reactive to light. Right eye exhibits no discharge. Left eye exhibits no discharge.  Neck: Normal range of motion. Neck supple. No tracheal deviation present.  Cardiovascular: Normal rate, regular rhythm, normal heart sounds and intact distal pulses.   Pulmonary/Chest: Effort normal. No respiratory distress. He has wheezes. He has rales. He exhibits no tenderness.  Abdominal: Soft. Bowel sounds are normal. He exhibits no distension and no mass. There is no tenderness. There is no rebound and no guarding.  Musculoskeletal: Normal range of motion. He exhibits no edema and no tenderness.  Visual inspection the back does not show any contusions abrasions or hematoma formation. The left lateral elbow does have a 2 cm hematoma. No effusion of the joint normal range of motion  Neurological: He is  alert and oriented to person, place, and time. No cranial nerve deficit.  The patient does have tremor in the hands and some pill rolling consistent with Parkinson's disease. His mental status however is clear his speech is cognitively normal. He follows all commands appropriately and has 5 out of 5 strength testing for upper and lower extremities. By history he does have gait instability and variable lower extremity weakness or causes some intermittent falls.  Skin: Skin is warm and dry.  Psychiatric: He has a normal mood and affect.    ED Course  Procedures (including critical care time) Labs Review Labs Reviewed - No data to display  Imaging Review Ct Head Wo Contrast  09/02/2014   CLINICAL DATA:  Head injury related to fall.  Initial encounter  EXAM: CT HEAD WITHOUT CONTRAST  TECHNIQUE: Contiguous axial images were obtained from the base of the skull through the vertex without intravenous contrast.  COMPARISON:  05/15/2013  FINDINGS: Skull and Sinuses:Negative for fracture or destructive process. The mastoids, middle ears, and imaged paranasal sinuses are clear.  Orbits: Bilateral cataract resection.  No traumatic findings.  Brain: No evidence of acute abnormality, such as acute infarction, hemorrhage, hydrocephalus, or mass lesion/mass effect.  There is generalized cerebral volume loss with ex vacuo ventricular enlargement. Moderate chronic small vessel disease with ischemic gliosis confluent around the lateral ventricles. These changes are stable from 2014.  IMPRESSION: No acute intracranial injury or calvarial fracture.   Electronically Signed   By: Jorje Guild M.D.   On: 09/02/2014 18:36     EKG Interpretation None      MDM   Final diagnoses:  Head injury  On anticoagulant therapy   CT head does not show any bleeding pattern. Patient does not have associated headache or postconcussive symptoms. At this time he will be discharged for home monitoring with his wife with head  injury precautions.    Charlesetta Shanks, MD 09/02/14 [phone]

## 2014-09-04 DIAGNOSIS — Z9181 History of falling: Secondary | ICD-10-CM | POA: Diagnosis not present

## 2014-09-04 DIAGNOSIS — R296 Repeated falls: Secondary | ICD-10-CM | POA: Diagnosis not present

## 2014-09-04 DIAGNOSIS — I2510 Atherosclerotic heart disease of native coronary artery without angina pectoris: Secondary | ICD-10-CM | POA: Diagnosis not present

## 2014-09-04 DIAGNOSIS — J45909 Unspecified asthma, uncomplicated: Secondary | ICD-10-CM | POA: Diagnosis not present

## 2014-09-04 DIAGNOSIS — I10 Essential (primary) hypertension: Secondary | ICD-10-CM | POA: Diagnosis not present

## 2014-09-04 DIAGNOSIS — G20 Parkinson's disease: Secondary | ICD-10-CM | POA: Diagnosis not present

## 2014-09-05 ENCOUNTER — Telehealth: Payer: Self-pay | Admitting: Neurology

## 2014-09-05 DIAGNOSIS — I2510 Atherosclerotic heart disease of native coronary artery without angina pectoris: Secondary | ICD-10-CM | POA: Diagnosis not present

## 2014-09-05 DIAGNOSIS — R296 Repeated falls: Secondary | ICD-10-CM | POA: Diagnosis not present

## 2014-09-05 DIAGNOSIS — I10 Essential (primary) hypertension: Secondary | ICD-10-CM | POA: Diagnosis not present

## 2014-09-05 DIAGNOSIS — G20 Parkinson's disease: Secondary | ICD-10-CM | POA: Diagnosis not present

## 2014-09-05 DIAGNOSIS — J45909 Unspecified asthma, uncomplicated: Secondary | ICD-10-CM | POA: Diagnosis not present

## 2014-09-05 DIAGNOSIS — Z9181 History of falling: Secondary | ICD-10-CM | POA: Diagnosis not present

## 2014-09-05 NOTE — Telephone Encounter (Signed)
FYI-Haley a physical therapist with [facility] is calling regarding patient. Patient's wife reported to [place] that he had a fall on 09-02-14 and he hit his head. Patient was taken to [facility] by ambulance and had a CT scan there which was negative.  Patient had no injuries. Patient was not admitted to the hospital and was sent home.

## 2014-09-05 NOTE — Telephone Encounter (Signed)
Called anmd could not LM as mailbox full.

## 2014-09-06 DIAGNOSIS — R296 Repeated falls: Secondary | ICD-10-CM | POA: Diagnosis not present

## 2014-09-06 DIAGNOSIS — G20 Parkinson's disease: Secondary | ICD-10-CM | POA: Diagnosis not present

## 2014-09-06 DIAGNOSIS — I10 Essential (primary) hypertension: Secondary | ICD-10-CM | POA: Diagnosis not present

## 2014-09-06 DIAGNOSIS — Z9181 History of falling: Secondary | ICD-10-CM | POA: Diagnosis not present

## 2014-09-06 DIAGNOSIS — I2510 Atherosclerotic heart disease of native coronary artery without angina pectoris: Secondary | ICD-10-CM | POA: Diagnosis not present

## 2014-09-06 DIAGNOSIS — J45909 Unspecified asthma, uncomplicated: Secondary | ICD-10-CM | POA: Diagnosis not present

## 2014-09-06 NOTE — Telephone Encounter (Signed)
I spoke with Hildred Alamin, PT with [facility] she relayed that pt had fall, as per below.  Will be seeing in the future a request for order for power wheelchair.

## 2014-09-08 ENCOUNTER — Other Ambulatory Visit: Payer: Self-pay | Admitting: Neurology

## 2014-09-10 DIAGNOSIS — I2510 Atherosclerotic heart disease of native coronary artery without angina pectoris: Secondary | ICD-10-CM | POA: Diagnosis not present

## 2014-09-10 DIAGNOSIS — J45909 Unspecified asthma, uncomplicated: Secondary | ICD-10-CM | POA: Diagnosis not present

## 2014-09-10 DIAGNOSIS — I10 Essential (primary) hypertension: Secondary | ICD-10-CM | POA: Diagnosis not present

## 2014-09-10 DIAGNOSIS — R296 Repeated falls: Secondary | ICD-10-CM | POA: Diagnosis not present

## 2014-09-10 DIAGNOSIS — G20 Parkinson's disease: Secondary | ICD-10-CM | POA: Diagnosis not present

## 2014-09-10 DIAGNOSIS — Z9181 History of falling: Secondary | ICD-10-CM | POA: Diagnosis not present

## 2014-09-11 DIAGNOSIS — Z9181 History of falling: Secondary | ICD-10-CM | POA: Diagnosis not present

## 2014-09-11 DIAGNOSIS — G20 Parkinson's disease: Secondary | ICD-10-CM | POA: Diagnosis not present

## 2014-09-11 DIAGNOSIS — I10 Essential (primary) hypertension: Secondary | ICD-10-CM | POA: Diagnosis not present

## 2014-09-11 DIAGNOSIS — J45909 Unspecified asthma, uncomplicated: Secondary | ICD-10-CM | POA: Diagnosis not present

## 2014-09-11 DIAGNOSIS — R296 Repeated falls: Secondary | ICD-10-CM | POA: Diagnosis not present

## 2014-09-11 DIAGNOSIS — I2510 Atherosclerotic heart disease of native coronary artery without angina pectoris: Secondary | ICD-10-CM | POA: Diagnosis not present

## 2014-09-12 DIAGNOSIS — R296 Repeated falls: Secondary | ICD-10-CM | POA: Diagnosis not present

## 2014-09-12 DIAGNOSIS — I10 Essential (primary) hypertension: Secondary | ICD-10-CM | POA: Diagnosis not present

## 2014-09-12 DIAGNOSIS — J45909 Unspecified asthma, uncomplicated: Secondary | ICD-10-CM | POA: Diagnosis not present

## 2014-09-12 DIAGNOSIS — G20 Parkinson's disease: Secondary | ICD-10-CM | POA: Diagnosis not present

## 2014-09-12 DIAGNOSIS — I2510 Atherosclerotic heart disease of native coronary artery without angina pectoris: Secondary | ICD-10-CM | POA: Diagnosis not present

## 2014-09-12 DIAGNOSIS — Z9181 History of falling: Secondary | ICD-10-CM | POA: Diagnosis not present

## 2014-09-17 DIAGNOSIS — I10 Essential (primary) hypertension: Secondary | ICD-10-CM | POA: Diagnosis not present

## 2014-09-17 DIAGNOSIS — G20 Parkinson's disease: Secondary | ICD-10-CM | POA: Diagnosis not present

## 2014-09-17 DIAGNOSIS — R296 Repeated falls: Secondary | ICD-10-CM | POA: Diagnosis not present

## 2014-09-17 DIAGNOSIS — J45909 Unspecified asthma, uncomplicated: Secondary | ICD-10-CM | POA: Diagnosis not present

## 2014-09-17 DIAGNOSIS — Z9181 History of falling: Secondary | ICD-10-CM | POA: Diagnosis not present

## 2014-09-17 DIAGNOSIS — I2510 Atherosclerotic heart disease of native coronary artery without angina pectoris: Secondary | ICD-10-CM | POA: Diagnosis not present

## 2014-09-18 ENCOUNTER — Telehealth: Payer: Self-pay | Admitting: Neurology

## 2014-09-18 NOTE — Telephone Encounter (Signed)
Jonna Coup with [facility] is calling. She needs an order for additional visits for patient. Patient's wife cancelled for this week because patient is sick. Please call. Thank you.

## 2014-09-18 NOTE — Telephone Encounter (Signed)
I spoke to [name] [name] and since pt sick (respiratory), wanted to delay last visit for one more week.  I relayed that this is ok.

## 2014-09-18 NOTE — Telephone Encounter (Signed)
Hildred Alamin, physical therapist at [facility] calling to state that patient and wife reports a fall on 09/16/14 and patient did not sustain any injury, if questions please call and advise.

## 2014-09-19 ENCOUNTER — Encounter: Payer: Self-pay | Admitting: Internal Medicine

## 2014-09-19 ENCOUNTER — Ambulatory Visit (INDEPENDENT_AMBULATORY_CARE_PROVIDER_SITE_OTHER): Payer: Medicare Other | Admitting: Internal Medicine

## 2014-09-19 VITALS — BP 142/82 | HR 74 | Temp 99.1°F | Wt 223.0 lb

## 2014-09-19 DIAGNOSIS — R296 Repeated falls: Secondary | ICD-10-CM | POA: Diagnosis not present

## 2014-09-19 DIAGNOSIS — I10 Essential (primary) hypertension: Secondary | ICD-10-CM

## 2014-09-19 DIAGNOSIS — I2510 Atherosclerotic heart disease of native coronary artery without angina pectoris: Secondary | ICD-10-CM | POA: Diagnosis not present

## 2014-09-19 DIAGNOSIS — J209 Acute bronchitis, unspecified: Secondary | ICD-10-CM | POA: Diagnosis not present

## 2014-09-19 DIAGNOSIS — R739 Hyperglycemia, unspecified: Secondary | ICD-10-CM

## 2014-09-19 DIAGNOSIS — G20 Parkinson's disease: Secondary | ICD-10-CM | POA: Diagnosis not present

## 2014-09-19 DIAGNOSIS — Z9181 History of falling: Secondary | ICD-10-CM | POA: Diagnosis not present

## 2014-09-19 DIAGNOSIS — J45909 Unspecified asthma, uncomplicated: Secondary | ICD-10-CM | POA: Diagnosis not present

## 2014-09-19 MED ORDER — HYDROCODONE-HOMATROPINE 5-1.5 MG/5ML PO SYRP: 5.0000 mL | ORAL_SOLUTION | Freq: Four times a day (QID) | ORAL | Status: DC | PRN

## 2014-09-19 MED ORDER — MOXIFLOXACIN HCL 400 MG PO TABS: 400.0000 mg | ORAL_TABLET | Freq: Every day | ORAL | Status: DC

## 2014-09-19 MED ORDER — CEFTRIAXONE SODIUM 1 G IJ SOLR
1.0000 g | Freq: Once | INTRAMUSCULAR | Status: AC
  Administered 2014-09-19: 1 g via INTRAMUSCULAR

## 2014-09-19 NOTE — Assessment & Plan Note (Signed)
stable overall by history and exam, recent data reviewed with pt, and pt to continue medical treatment as before,  to f/u any worsening symptoms or concerns BP Readings from Last 3 Encounters:  09/19/14 142/82  09/02/14 153/86  07/30/14 140/74

## 2014-09-19 NOTE — Telephone Encounter (Signed)
Noted.  Pt has appt 09-30-14 for RV with Dr. Brett Fairy.   Previous note, pt is ill with respiratory sx and delayed therapy session to next week. I called and LMVM for [facility], that was calling to check on pt.

## 2014-09-19 NOTE — Progress Notes (Signed)
Subjective:    Patient ID: [name] [name], male    DOB: 1935/02/15, 79 y.o.   MRN: [id number]  HPI  Here with acute onset mild to mod 2-3 days ST, HA, general weakness and malaise, with prod cough greenish sputum, but Pt denies chest pain, increased sob or doe, wheezing, orthopnea, PND, increased LE swelling, palpitations, dizziness or syncope. Unable to do xray in the office due to not being to stand  Currently being fitted for power wheelchair.  Wife has Dm and a glucometer at home, has been testing him in the past wk, with sugars mild elevated approx 150 on average.  Pt denies polydipsia, polyuria.  No prior hx of DM Past Medical History  Diagnosis Date  . ONYCHOMYCOSIS, TOENAILS 06/27/2008  . HYPERLIPIDEMIA 07/28/2007  . ANXIETY 09/28/2007  . OBSTRUCTIVE SLEEP APNEA 01/01/2008  . RESTLESS LEGS SYNDROME 12/26/2007  . HEARING LOSS 06/27/2008  . HYPERTENSION 07/28/2007  . MYOCARDIAL INFARCTION, HX OF 07/28/2007  . CORONARY ARTERY DISEASE 07/28/2007  . Atrial fibrillation 09/28/2007  . SINUSITIS- ACUTE-NOS 10/10/2007  . ALLERGIC RHINITIS 09/28/2007  . CHOLELITHIASIS 09/28/2007  . BENIGN PROSTATIC HYPERTROPHY 09/28/2007  . CELLULITIS, HAND, LEFT 09/24/2010  . SYNCOPE 07/28/2007  . INSOMNIA-SLEEP DISORDER-UNSPEC 04/03/2009  . HYPERSOMNIA WITH SLEEP APNEA UNSPECIFIED 12/26/2007  . FATIGUE 01/01/2008  . DYSPNEA 04/03/2009  . Wheezing 11/06/2009  . ANGIOEDEMA 11/06/2009  . COLON CANCER, HX OF 07/28/2007  . PROSTATE CANCER, HX OF 07/28/2007  . COLONIC POLYPS, HX OF 07/28/2007  . Long term current use of anticoagulant 12/22/2010  . Asthma   . DEMENTIA   . Spinal stenosis   . History of atrial fibrillation   . Lumbar stenosis 10/30/2011  . CAD (coronary artery disease)     Severe three-vessel coronary disease. LIMA to the LAD patent. SVG to PDA patent. SVG to OM 3 patent. Preserved ejection fraction. 2008.  . Cancer   . Thyroid disease   . Arthritis   . Pneumonia   . 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 Sepsis with acute organ dysfunction 09/21/2011  . 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 Cervical disc disorder with radiculopathy of cervical region 04/15/2014  . 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 Prostatectomy  2001  . Cardioversion  2007  . Stress cardiolite  02/08/2007  . Schocardiograph  10/07/2006  . Right thyroid lobectomy    . Coronary artery bypass graft  1995    x 3  . Hernia repair  1998  . Colonoscopy  2010    last done 2010- clear  . Cardiac electrophysiology study and ablation    . Thyroidectomy, partial      reports that he has never smoked. He has never used smokeless tobacco. He reports that he does not drink alcohol or use illicit drugs. family history includes Coronary artery disease in his father and sister; Hypertension in his other. Allergies  Allergen Reactions  . Lisinopril Swelling  . Statins     parkinsons   . Thorazine [Chlorpromazine]     Confusion/loopy   Current Outpatient Prescriptions on File Prior to Visit  Medication Sig Dispense Refill  . albuterol (PROAIR HFA) 108 (90 BASE) MCG/ACT inhaler Inhale 2 puffs into the lungs every 4 (four) hours as needed. For wheezing 3 Inhaler 3  . ALPRAZolam (XANAX) 0.5 MG tablet Take 0.5 mg by mouth  daily as needed for anxiety. As needed    . amLODipine (NORVASC) 5 MG tablet Take 1 tablet (5 mg total) by mouth daily. 90 tablet 3  . apixaban (ELIQUIS) 5 MG TABS tablet Take 1 tablet (5 mg total) by mouth 2 (two) times daily. 180 tablet 3  . buPROPion (WELLBUTRIN XL) 150 MG 24 hr tablet Take 1 tablet (150 mg total) by mouth daily. 90 tablet 3  . carbidopa-levodopa (SINEMET IR) 25-100 MG per tablet Take 1.5 tablets by mouth 4 (four) times daily. At 10AM, 2PM, 6PM AND 10PM 540 tablet 1  . Cholecalciferol (VITAMIN D3) 2000 UNITS TABS Take 2 tablets by mouth daily.       . clonazePAM (KLONOPIN) 1 MG tablet Take 2 mg by mouth every evening. Can take 1/2 tablet more, if having difficulty sleeping.    . Coenzyme Q10 (CO Q 10 PO) Take 1 tablet by mouth at bedtime.    Marland Kitchen desloratadine (CLARINEX) 5 MG tablet Take 1 tablet (5 mg total) by mouth daily. 90 tablet 3  . docusate sodium (COLACE) 100 MG capsule Take 300 mg by mouth at bedtime.     . donepezil (ARICEPT) 23 MG TABS tablet TAKE 1 TABLET EVERY MORNING 90 tablet 1  . FLUoxetine (PROZAC) 40 MG capsule Take 1 capsule (40 mg total) by mouth every morning. 90 capsule 3  . fluticasone (VERAMYST) 27.5 MCG/SPRAY nasal spray Place 1 spray into the nose 2 (two) times daily.    . Fluticasone-Salmeterol (ADVAIR) 250-50 MCG/DOSE AEPB Inhale 1 puff into the lungs every 12 (twelve) hours. As needed    . guaiFENesin (ROBITUSSIN) 100 MG/5ML liquid Take 200 mg by mouth 4 (four) times daily as needed for cough.    Marland Kitchen ibuprofen (ADVIL,MOTRIN) 200 MG tablet Take 400 mg by mouth every 6 (six) hours as needed for pain.    Marland Kitchen L-Methylfolate-B12-B6-B2 (CEREFOLIN) 04-08-49-5 MG TABS TAKE 1 TABLET DAILY 90 each 3  . levocetirizine (XYZAL) 5 MG tablet Take 1 tablet (5 mg total) by mouth every evening. 90 tablet 3  . Multiple Vitamin (MULTIVITAMINS PO) Take 1 tablet by mouth at bedtime.     . Olopatadine HCl (PATADAY) 0.2 % SOLN Apply to eye as needed.     . primidone (MYSOLINE) 50 MG tablet TAKE 1 TABLET TWICE A DAY  AS DIRECTED 180 tablet 1  . solifenacin (VESICARE) 5 MG tablet Take 5 mg by mouth daily.     No current facility-administered medications on file prior to visit.  ' Review of Systems Unable to do due to dementia    Objective:   Physical Exam BP 142/82 mmHg  Pulse 74  Temp(Src) 99.1 F (37.3 C) (Oral)  Wt 223 lb (101.152 kg)  SpO2 94% VS noted, mild ill Constitutional: Pt appears well-developed, well-nourished.  HENT: Head: NCAT.  Right Ear: External ear normal.  Left Ear: External ear normal.  Eyes: . Pupils are  equal, round, and reactive to light. Conjunctivae and EOM are normal Neck: Normal range of motion. Neck supple.  Bilat tm's with mild erythema.  Max sinus areas non tender.  Pharynx with mild erythema, no exudate Cardiovascular: Normal rate and regular rhythm.   Pulmonary/Chest: Effort normal and breath sounds with few LLL rales  Neurological: Pt is alert. Not confused , motor grossly intact Skin: Skin is warm. No rash Psychiatric: Pt behavior is normal. No agitation.     Assessment & Plan:

## 2014-09-19 NOTE — Assessment & Plan Note (Addendum)
Mild to mod, for rocephin IM x 1 , and antibx course,  to f/u any worsening symptoms or concerns, cant r/o pna, but wife declines cxr,

## 2014-09-19 NOTE — Progress Notes (Signed)
Pre visit review using our clinic review tool, if applicable. No additional management support is needed unless otherwise documented below in the visit note. 

## 2014-09-19 NOTE — Assessment & Plan Note (Signed)
?   New DM - for a1c, lab,  to f/u any worsening symptoms or concerns

## 2014-09-19 NOTE — Telephone Encounter (Signed)
Spouse returning [name] [name]'s call. °

## 2014-09-19 NOTE — Patient Instructions (Signed)
You had the antibiotic shot today (rocephin)  Please take all new medication as prescribed  - the avelox, and cough medicine  Please continue all other medications as before, and refills have been done if requested.  Please have the pharmacy call with any other refills you may need.  Please keep your appointments with your specialists as you may have planned  Please go to the LAB in the Basement (turn left off the elevator) for the tests to be done tomorrow or at your convenience  You will be contacted by phone if any changes need to be made immediately.  Otherwise, you will receive a letter about your results with an explanation, but please check with MyChart first.  Please remember to sign up for MyChart if you have not done so, as this will be important to you in the future with finding out test results, communicating by private email, and scheduling acute appointments online when needed.

## 2014-09-20 NOTE — Telephone Encounter (Signed)
Spoke with wife.  Pt has respiratory/pneumonia and it being treated by pcp.  (this was caught early so he is doing ok).  She asked about appt upcoming and  if possible to see about WC eval.  I told her that [place] with [facility] usually comes for this when MD see pt.  Would check on and see if could happen.

## 2014-09-23 ENCOUNTER — Telehealth: Payer: Self-pay | Admitting: Neurology

## 2014-09-23 DIAGNOSIS — Z9181 History of falling: Secondary | ICD-10-CM | POA: Diagnosis not present

## 2014-09-23 DIAGNOSIS — J45909 Unspecified asthma, uncomplicated: Secondary | ICD-10-CM | POA: Diagnosis not present

## 2014-09-23 DIAGNOSIS — I2510 Atherosclerotic heart disease of native coronary artery without angina pectoris: Secondary | ICD-10-CM | POA: Diagnosis not present

## 2014-09-23 DIAGNOSIS — I10 Essential (primary) hypertension: Secondary | ICD-10-CM | POA: Diagnosis not present

## 2014-09-23 DIAGNOSIS — R296 Repeated falls: Secondary | ICD-10-CM | POA: Diagnosis not present

## 2014-09-23 DIAGNOSIS — G20 Parkinson's disease: Secondary | ICD-10-CM | POA: Diagnosis not present

## 2014-09-23 NOTE — Telephone Encounter (Signed)
Patient's spouse stated [facility] called and stated Dr. Brett Fairy cancelled appointment for Wheel Chair evaluation.  Spouse stated this appointment is still needed.  Please call and advise.

## 2014-09-25 DIAGNOSIS — J45909 Unspecified asthma, uncomplicated: Secondary | ICD-10-CM | POA: Diagnosis not present

## 2014-09-25 DIAGNOSIS — I10 Essential (primary) hypertension: Secondary | ICD-10-CM | POA: Diagnosis not present

## 2014-09-25 DIAGNOSIS — I2510 Atherosclerotic heart disease of native coronary artery without angina pectoris: Secondary | ICD-10-CM | POA: Diagnosis not present

## 2014-09-25 DIAGNOSIS — Z9181 History of falling: Secondary | ICD-10-CM | POA: Diagnosis not present

## 2014-09-25 DIAGNOSIS — R296 Repeated falls: Secondary | ICD-10-CM | POA: Diagnosis not present

## 2014-09-25 DIAGNOSIS — G20 Parkinson's disease: Secondary | ICD-10-CM | POA: Diagnosis not present

## 2014-09-25 NOTE — Telephone Encounter (Signed)
LMVM for [name] [name] re: to face to face appt with pt on 10-01-14.  Pt has WC Eval on 11-21-14 next door (neuro rehab).

## 2014-09-26 DIAGNOSIS — I2510 Atherosclerotic heart disease of native coronary artery without angina pectoris: Secondary | ICD-10-CM | POA: Diagnosis not present

## 2014-09-26 DIAGNOSIS — G20 Parkinson's disease: Secondary | ICD-10-CM | POA: Diagnosis not present

## 2014-09-26 DIAGNOSIS — Z9181 History of falling: Secondary | ICD-10-CM | POA: Diagnosis not present

## 2014-09-26 DIAGNOSIS — I10 Essential (primary) hypertension: Secondary | ICD-10-CM | POA: Diagnosis not present

## 2014-09-26 DIAGNOSIS — R296 Repeated falls: Secondary | ICD-10-CM | POA: Diagnosis not present

## 2014-09-26 DIAGNOSIS — J45909 Unspecified asthma, uncomplicated: Secondary | ICD-10-CM | POA: Diagnosis not present

## 2014-09-27 NOTE — Telephone Encounter (Signed)
LMVM for [name] [name] at [facility] re: to wheelchair eval for pt.  She returned message 09-27-14 0827 that she is checking on this for me.

## 2014-09-27 NOTE — Telephone Encounter (Signed)
Betsy S. LM that will see pt for face to face after wheelchair eval in January 2016.  I called and spoke to wife.  Will keep appt for 10-01-14 to f/u falls and breathing.  Will make appt for face to face after eval done for wheelchair in January.  She verbalized understanding.

## 2014-10-01 ENCOUNTER — Ambulatory Visit (INDEPENDENT_AMBULATORY_CARE_PROVIDER_SITE_OTHER): Payer: Medicare Other | Admitting: Neurology

## 2014-10-01 ENCOUNTER — Encounter: Payer: Self-pay | Admitting: Neurology

## 2014-10-01 VITALS — BP 137/75 | HR 75 | Resp 18 | Ht 69.0 in | Wt 223.0 lb

## 2014-10-01 DIAGNOSIS — I2510 Atherosclerotic heart disease of native coronary artery without angina pectoris: Secondary | ICD-10-CM | POA: Diagnosis not present

## 2014-10-01 DIAGNOSIS — G238 Other specified degenerative diseases of basal ganglia: Secondary | ICD-10-CM

## 2014-10-01 DIAGNOSIS — G4752 REM sleep behavior disorder: Secondary | ICD-10-CM

## 2014-10-01 DIAGNOSIS — G232 Striatonigral degeneration: Secondary | ICD-10-CM

## 2014-10-01 HISTORY — DX: Striatonigral degeneration: G23.2

## 2014-10-01 MED ORDER — CLONAZEPAM 1 MG PO TABS: 2.0000 mg | ORAL_TABLET | Freq: Every day | ORAL | Status: DC

## 2014-10-01 NOTE — Telephone Encounter (Signed)
Debbie from [facility] calling to speak with Lovey Newcomer, please return call at [phone].

## 2014-10-01 NOTE — Progress Notes (Signed)
Subjective:    Patient ID: [name] [name] is a 79 y.o. male.  HPI:  Symptoms started in 2008, initially was confusion and memory loss. Initially diagnosed with Alzheimer's. They then noted a bilateral hand tremor, they are unsure if it is rest or action. Started having difficulty walking around 3 years ago. Noted difficulty lifting his feet off the ground, has a lot of freezing of his steps. Multiple falls, will trip over his feet. With the walker can walk up to 30 feet. Recently finished a course of PT, finished around 3 weeks ago. Requesting referral for home PT.  1)Parkinson plus syndrome, Gait instability - FACE TO FACE ; he needs a power wheelchair for mobility assistance Legs give out. [place] today here in the practice during face to face evaluation. .   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 Takes klonopin which has calmed this down. Wife notes a stridor type noise when he is sleeping. Denies any loss of sense of smell. CPAP user, but CPAP broke.   Currently taking Sinemet 25-100 1.5 tabs  4x a day at 10am, 2pm, 6pm and 10pm. Feels he gets some benefit from it. If he misses a dose or gets it later then he will have more difficulty moving.   History per prior notes;   [name] Like, DO  Mr. [name] [name] is a very pleasant 79 yo RH gentleman who is seen on behalf of [name] [name]. He is accompanied by his wife today. He has an underlying complex history including parkinsonism, dementia, anxiety, OSA, restless leg syndrome, hearing loss, hypertension, heart disease with status post MI and status post CABG, atrial fibrillation, history of syncope, insomnia, history of colon cancer, asthma, spinal stenosis, thyroid disease, arthritis, and has had recurrent falls. He started seeing [name] [name] I believe in 2011 and was most recently seen by our nurse practitioner in February 2014, at which time he was continued on Sinemet 3 times a day and advised to reduce prednisone. His wife called today stating that to him  he needed to be seen because of recurrent falls. His appointment was canceled for last week and he was not yet rescheduled for another appointment. He was seen in the emergency room on 05/15/2013 after a fall. He had fallen backwards hitting his head on concrete while trying to get into his car. His wife also reported increasing confusion over the past for 5 days prior to your ED visit. He had a CT head which showed stable atrophy and white matter changes and no acute abnormality. He had chest x-ray which was negative for pneumonia. Laboratory function showed normal electrolytes, normal CBC, normal urinalysis. His wife states, that is legs give out and he has a tendency to fall backwards. He has had PT before and last went in November. PT has helped in the past. She would [name] a referral for PT again. He went to PT and speech rehab on [employer] [phone]).  He has RLS, RBD, OSA on CPAP. He has a very erratic sleep wake schedule. He and his wife go to bed late around 2 AM and he wakes up at 10, but goes back to sleep and over the course of the day, he takes at least 2 three-hour naps. He uses CPAP at night, but not for his naps.  C/L is helpful, but he does not usually takes the 3 doses. He often misses the last dose. He has no hallucinations.    His Past Medical History Is Significant  For: Past Medical History  Diagnosis Date  . ONYCHOMYCOSIS, TOENAILS 06/27/2008  . HYPERLIPIDEMIA 07/28/2007  . ANXIETY 09/28/2007  . OBSTRUCTIVE SLEEP APNEA 01/01/2008  . RESTLESS LEGS SYNDROME 12/26/2007  . HEARING LOSS 06/27/2008  . HYPERTENSION 07/28/2007  . MYOCARDIAL INFARCTION, HX OF 07/28/2007  . CORONARY ARTERY DISEASE 07/28/2007  . Atrial fibrillation 09/28/2007  . SINUSITIS- ACUTE-NOS 10/10/2007  . ALLERGIC RHINITIS 09/28/2007  . CHOLELITHIASIS 09/28/2007  . BENIGN PROSTATIC HYPERTROPHY 09/28/2007  . CELLULITIS, HAND, LEFT 09/24/2010  . SYNCOPE 07/28/2007  . INSOMNIA-SLEEP DISORDER-UNSPEC 04/03/2009  .  HYPERSOMNIA WITH SLEEP APNEA UNSPECIFIED 12/26/2007  . FATIGUE 01/01/2008  . DYSPNEA 04/03/2009  . Wheezing 11/06/2009  . ANGIOEDEMA 11/06/2009  . COLON CANCER, HX OF 07/28/2007  . PROSTATE CANCER, HX OF 07/28/2007  . COLONIC POLYPS, HX OF 07/28/2007  . Long term current use of anticoagulant 12/22/2010  . Asthma   . DEMENTIA   . Spinal stenosis   . History of atrial fibrillation   . Lumbar stenosis 10/30/2011  . CAD (coronary artery disease)     Severe three-vessel coronary disease. LIMA to the LAD patent. SVG to PDA patent. SVG to OM 3 patent. Preserved ejection fraction. 2008.  . Cancer   . Thyroid disease   . Arthritis   . Pneumonia   . 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 Sepsis with acute organ dysfunction 09/21/2011  . 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 Cervical disc disorder with radiculopathy of cervical region 04/15/2014  . 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 Prostatectomy  2001  . Cardioversion  2007  . Stress cardiolite  02/08/2007  . Schocardiograph  10/07/2006  . Right thyroid lobectomy    . Coronary artery bypass graft  1995    x 3  . Hernia repair  1998  . Colonoscopy  2010    last done 2010- clear  . Cardiac electrophysiology study and ablation    . Thyroidectomy, partial      His Family History Is Significant For: Family History  Problem Relation Age of Onset  . Coronary artery disease Father     before 77 yo  . Coronary artery disease Sister     before 27 yo  . Hypertension Other     His Social History Is Significant For: History   Social History  . Marital Status: Married    Spouse Name: [name] [name]    Number of Children: 2  . Years of Education: 14   Occupational History  . retired Health visitor    Social History Main Topics  . Smoking  status: Never Smoker   . Smokeless tobacco: Never Used  . Alcohol Use: No  . Drug Use: No  . Sexual Activity: None   Other Topics Concern  . None   Social History Narrative   Patient is married ([name] [name]) and lives at home with his wife.   Patient has 7 children.   Patient is retired.   Patient has a Associates degree.   Patient is right-handed.   Patient does not drink any caffeine.    His Allergies Are:  Allergies  Allergen Reactions  . Lisinopril Swelling  . Statins     parkinsons   . Thorazine [Chlorpromazine]     Confusion/loopy  :   His Current Medications Are:  Outpatient Encounter  Prescriptions as of 10/01/2014  Medication Sig  . acetaminophen (TYLENOL) 500 MG tablet Take 500 mg by mouth every 4 (four) hours as needed.  Marland Kitchen albuterol (PROAIR HFA) 108 (90 BASE) MCG/ACT inhaler Inhale 2 puffs into the lungs every 4 (four) hours as needed. For wheezing  . ALPRAZolam (XANAX) 0.5 MG tablet Take 0.5 mg by mouth daily as needed for anxiety. As needed  . amLODipine (NORVASC) 5 MG tablet Take 1 tablet (5 mg total) by mouth daily.  Marland Kitchen apixaban (ELIQUIS) 5 MG TABS tablet Take 1 tablet (5 mg total) by mouth 2 (two) times daily.  Marland Kitchen buPROPion (WELLBUTRIN XL) 150 MG 24 hr tablet Take 1 tablet (150 mg total) by mouth daily.  . carbidopa-levodopa (SINEMET IR) 25-100 MG per tablet Take 1.5 tablets by mouth 4 (four) times daily. At 10AM, 2PM, 6PM AND 10PM  . Cholecalciferol (VITAMIN D3) 2000 UNITS TABS Take 2 tablets by mouth daily.   . clonazePAM (KLONOPIN) 1 MG tablet Take 2 mg by mouth every evening. Can take 1/2 tablet more, if having difficulty sleeping.  . Coenzyme Q10 (CO Q 10 PO) Take 1 tablet by mouth at bedtime.  Marland Kitchen desloratadine (CLARINEX) 5 MG tablet Take 1 tablet (5 mg total) by mouth daily.  Marland Kitchen docusate sodium (COLACE) 100 MG capsule Take 300 mg by mouth at bedtime.   . donepezil (ARICEPT) 23 MG TABS tablet TAKE 1 TABLET EVERY MORNING  . FLUoxetine (PROZAC) 40 MG capsule Take  1 capsule (40 mg total) by mouth every morning.  . fluticasone (VERAMYST) 27.5 MCG/SPRAY nasal spray Place 1 spray into the nose 2 (two) times daily.  . Fluticasone-Salmeterol (ADVAIR) 250-50 MCG/DOSE AEPB Inhale 1 puff into the lungs every 12 (twelve) hours. As needed  . guaiFENesin (ROBITUSSIN) 100 MG/5ML liquid Take 200 mg by mouth 4 (four) times daily as needed for cough.  Marland Kitchen HYDROcodone-homatropine (HYCODAN) 5-1.5 MG/5ML syrup Take 5 mLs by mouth every 6 (six) hours as needed for cough.  Marland Kitchen ibuprofen (ADVIL,MOTRIN) 200 MG tablet Take 400 mg by mouth every 6 (six) hours as needed for pain.  Marland Kitchen L-Methylfolate-B12-B6-B2 (CEREFOLIN) 04-08-49-5 MG TABS TAKE 1 TABLET DAILY  . levocetirizine (XYZAL) 5 MG tablet Take 1 tablet (5 mg total) by mouth every evening.  . moxifloxacin (AVELOX) 400 MG tablet Take 1 tablet (400 mg total) by mouth daily.  . Multiple Vitamin (MULTIVITAMINS PO) Take 1 tablet by mouth at bedtime.   . Olopatadine HCl (PATADAY) 0.2 % SOLN Apply to eye as needed (both eyes).   . primidone (MYSOLINE) 50 MG tablet TAKE 1 TABLET TWICE A DAY  AS DIRECTED  . solifenacin (VESICARE) 5 MG tablet Take 5 mg by mouth daily.   Review of Systems  Constitutional: Positive for fatigue.  HENT: Positive for hearing loss, rhinorrhea, trouble swallowing and tinnitus.   Respiratory: Positive for shortness of breath and wheezing.        Uses a CPAP  Gastrointestinal: Positive for constipation.  Musculoskeletal: Positive for myalgias and arthralgias.  Allergic/Immunologic: Positive for environmental allergies.  Neurological: Positive for tremors.       Memory loss Restless leg  Hematological: Bruises/bleeds easily.  Psychiatric/Behavioral: Positive for confusion, sleep disturbance and dysphoric mood. The patient is nervous/anxious.     Objective:  Neurologic Exam  Physical Exam Physical Examination:   Filed Vitals:   10/01/14 1518  BP: 137/75  Pulse: 75  Resp: 18   His repeat sitting  blood pressure was 121/67 with a pulse of 57  and standing blood pressure was 129/68 with a pulse of 60. He did not report any lightheadedness upon standing.  General Examination: The patient is a very pleasant 79 y.o. male in no acute distress.  HEENT: Normocephalic, atraumatic, pupils are equal, round and reactive to light and accommodation. Funduscopic exam is normal with sharp disc margins noted. Extraocular tracking shows mild saccadic breakdown without nystagmus noted. There is limitation to upper gaze. There is mild decrease in eye blink rate. Hearing is intact. Tympanic membranes are clear bilaterally. Face is symmetric with mild facial masking and normal facial sensation. There is no lip, neck or jaw tremor. Neck is moderately rigid with intact passive ROM. There are no carotid bruits on auscultation. Tongue protrudes centrally and palate elevates symmetrically.   There is no drooling.   Neurologically:  Mental status: The patient is awake and alert, paying good  attention. He is able to partially provide the history. His wife provides details of the history. His  MOCA was today, 10-01-14  20 out of 30, could not recallone of 5 words and could not perform a trail making test. There is a mild degree of bradyphrenia. Speech is hypophonic and dysarthria is noted. Mood is ' gloomy" .  Cranial nerves are as described above under HEENT exam.  In addition, shoulder shrug is normal with equal shoulder height noted. Motor exam: Normal bulk, and strength for age is noted. There are no dyskinesias noted.    Tone is  rigid with strong biceps and wrist cogwheeling. There is overall severe  bradykinesia. There is no drift or rebound.  There is a slight intermittent resting tremor in the left thumb only.   Reflexes are 2+ in the upper extremities and 1 plus patella / lower extremities.    Fine motor skills exam: Finger taps are  impaired on the right and left. Hand movements are impaired on the right  left. RAP (rapid alternating patting) is moderately impaired bilaterally.  Foot taps are moderately impaired on the right and moderately impaired on the left. Foot agility (in the form of heel stomping) is moderately impaired on the right and moderately impaired on the left.   Brief myoclonic jerks of bilateral UE noted.  Cerebellar testing shows dysmetria but not  intention tremor on finger to nose testing. He has resting tremor on the left hand, pill rolling.   Sensory exam is intact to light touch, pinprick, vibration, temperature sense and proprioception in the upper and lower extremities.   Gait, station and balance: He requires assistance to stands up from the seated position from his rolling walker with seat with moderate difficulty. Requires assistance to walk No veering to one side is noted. He is not noted to lean to the side. Posture is moderately stooped. He appears trembling and rigid in his posture. He makes eye contact, his voice is low volume and dysphonic.   Stance is wide-based.  He fell on his way to the exam room. He now is in a wheelchair and denies any pain , previous to the fall he walked with decrease in stride length and pace and decreased arm swing on the left. Tandem walk is not possible. Balance is now severely impaired, unable to do a toe or heel stance.      Assessment and Plan:   1)Parkinson plus syndrome, Gait instability - FACE TO FACE ; he needs a power wheelchair for mobility assistance. This visit was precipitated by the patient's frequent falls the last on right here  in the office witnessed today. He will need a power wheelchair. He is able to use his hands to steer the device but I would not want him to have a manual wheelchair as he doesn't have the strength to propel himself forward. I'm also referring the patient for physical therapy evaluation with a goal of measuring for the power wheelchair and to specify a design the height of the seat and any other  specific patient's physical therapy. 3) OSA, currently untreated as CPAP machine broke.  He is followed by [facility] - medical necessity to be faxed to [facility] here. Fax  [phone]. CPAP , 11 CM water, FFM.   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 memory loss, gait disorder, likely multifactorial in nature.   Mr.Scheier has suffered from 3 spells of bronchitis that ended up to become pneumonia and was not  hospitalized for the treatment- avelox given at home.  Dr. Jenny Reichmann has reduced the antihypertensive medication as requested after the last visit. In May of this year the patient was evaluated by a neuropsychologist, Dr. Marlane Hatcher. This was a second visit with the patient. After the MRI f the brain performed in March 2015 had confirmed sylvian fissure atrophy.  The report of the neuropsychologist 03-26-14 stated that there has been  a progression of cognitive symptoms particularly over the last 6 months prior to the visit,   dramatic fluctuation in symptom intensity day by day.  The MMSE at that time was 22 out of 30 points, he lost 2 of the 3 words and word we take recall. Was disoriented to date could not identify the floor of the building he was on he had some trouble with his serial subtraction of sevens. The neuropsychologist did not find him significantly depressed or anxious at the time. He appeared fatigued. The pattern is consistent with deficits expected Parkinson plus syndromes" noting the multiple system atrophy it is not consistent with the pattern expected and Alzheimer's disease. I have suggested to consider the gel form or Droxidopa , a dopaminergic preparation used preferably for MSA patients in the future.  Right now he seems to be somewhat controlled with the Sinemet 4 times daily however a longer acting medication may also allow him to take medication only  twice or 3 times a day and would certainly have a life quality benefit.   His physical exam documented decline .  Based on clinical history, time course and symptoms this could be consistent with a MSA type picture. Based on his + response to Sinemet I would also consider idiopathic PD, though MSA patients will also often have a less robust response to L-dopa. Dr Janann Colonel had increased his SInemet 25-100 to 1.5 tablets 4x a day. This helped him to move better, but did not affect the dysautonomia.  He seems to have declined further, walks only 20 steps. He needs a  Power wheelchair.     He has a  benefit from neuro-rehab referral for gait training/Parkinson's specific PT. He is having these at home, and the therapist can help [place] to clarify the device needed. .   Follow up with my NP or me, Dr Brett [name],  in 4 months.  Consider change to Droxidopa on RV .

## 2014-10-01 NOTE — Telephone Encounter (Signed)
I called and spoke to Andria Rhein with [facility] about PW eval.  She stated if included in note today, can you this and may not need to come back in for face to face after the PT eval.  If does then fax to [place].  1) Statement that pt is here today for power WC assessment, (this has to be main reason, although may see for other reasons).  2) I am referring pt for PT evaluation for power WC evaluation.  (even if already scheduled for PT eval).

## 2014-10-01 NOTE — Telephone Encounter (Signed)
See later note

## 2014-10-02 DIAGNOSIS — G20 Parkinson's disease: Secondary | ICD-10-CM | POA: Diagnosis not present

## 2014-10-02 DIAGNOSIS — Z9181 History of falling: Secondary | ICD-10-CM | POA: Diagnosis not present

## 2014-10-02 DIAGNOSIS — R296 Repeated falls: Secondary | ICD-10-CM | POA: Diagnosis not present

## 2014-10-02 DIAGNOSIS — J45909 Unspecified asthma, uncomplicated: Secondary | ICD-10-CM | POA: Diagnosis not present

## 2014-10-02 DIAGNOSIS — I2510 Atherosclerotic heart disease of native coronary artery without angina pectoris: Secondary | ICD-10-CM | POA: Diagnosis not present

## 2014-10-02 DIAGNOSIS — I10 Essential (primary) hypertension: Secondary | ICD-10-CM | POA: Diagnosis not present

## 2014-10-09 DIAGNOSIS — R296 Repeated falls: Secondary | ICD-10-CM | POA: Diagnosis not present

## 2014-10-09 DIAGNOSIS — Z9181 History of falling: Secondary | ICD-10-CM | POA: Diagnosis not present

## 2014-10-09 DIAGNOSIS — G20 Parkinson's disease: Secondary | ICD-10-CM | POA: Diagnosis not present

## 2014-10-09 DIAGNOSIS — J45909 Unspecified asthma, uncomplicated: Secondary | ICD-10-CM | POA: Diagnosis not present

## 2014-10-09 DIAGNOSIS — I10 Essential (primary) hypertension: Secondary | ICD-10-CM | POA: Diagnosis not present

## 2014-10-09 DIAGNOSIS — I2510 Atherosclerotic heart disease of native coronary artery without angina pectoris: Secondary | ICD-10-CM | POA: Diagnosis not present

## 2014-10-11 ENCOUNTER — Telehealth: Payer: Self-pay | Admitting: *Deleted

## 2014-10-11 NOTE — Telephone Encounter (Signed)
Wayne Sever PT calling with [facility] about pt having increased falls.  Had fall 10-09-14 at [place] and [address].   (scraped and cut back).  EMS out both times to assist with getting up.   Pt sore.  Both happening when transferring.   Verbal order for continuing PT given per Dr. Brett Fairy.  Wife asked about increasing sinemet to 2 tabs qid (now taking 1 1/2 tabs qid).  Will forward.  May get answer on Monday.

## 2014-10-14 DIAGNOSIS — I10 Essential (primary) hypertension: Secondary | ICD-10-CM | POA: Diagnosis not present

## 2014-10-14 DIAGNOSIS — Z9181 History of falling: Secondary | ICD-10-CM | POA: Diagnosis not present

## 2014-10-14 DIAGNOSIS — G20 Parkinson's disease: Secondary | ICD-10-CM | POA: Diagnosis not present

## 2014-10-14 DIAGNOSIS — I2510 Atherosclerotic heart disease of native coronary artery without angina pectoris: Secondary | ICD-10-CM | POA: Diagnosis not present

## 2014-10-14 DIAGNOSIS — R296 Repeated falls: Secondary | ICD-10-CM | POA: Diagnosis not present

## 2014-10-14 DIAGNOSIS — J45909 Unspecified asthma, uncomplicated: Secondary | ICD-10-CM | POA: Diagnosis not present

## 2014-10-15 DIAGNOSIS — M19012 Primary osteoarthritis, left shoulder: Secondary | ICD-10-CM | POA: Diagnosis not present

## 2014-10-15 DIAGNOSIS — G903 Multi-system degeneration of the autonomic nervous system: Secondary | ICD-10-CM | POA: Diagnosis not present

## 2014-10-15 DIAGNOSIS — Z9181 History of falling: Secondary | ICD-10-CM | POA: Diagnosis not present

## 2014-10-15 DIAGNOSIS — M5117 Intervertebral disc disorders with radiculopathy, lumbosacral region: Secondary | ICD-10-CM | POA: Diagnosis not present

## 2014-10-15 DIAGNOSIS — I10 Essential (primary) hypertension: Secondary | ICD-10-CM | POA: Diagnosis not present

## 2014-10-15 DIAGNOSIS — I2510 Atherosclerotic heart disease of native coronary artery without angina pectoris: Secondary | ICD-10-CM | POA: Diagnosis not present

## 2014-10-15 DIAGNOSIS — J45909 Unspecified asthma, uncomplicated: Secondary | ICD-10-CM | POA: Diagnosis not present

## 2014-10-18 DIAGNOSIS — I2510 Atherosclerotic heart disease of native coronary artery without angina pectoris: Secondary | ICD-10-CM | POA: Diagnosis not present

## 2014-10-18 DIAGNOSIS — I10 Essential (primary) hypertension: Secondary | ICD-10-CM | POA: Diagnosis not present

## 2014-10-18 DIAGNOSIS — Z9181 History of falling: Secondary | ICD-10-CM | POA: Diagnosis not present

## 2014-10-18 DIAGNOSIS — M5117 Intervertebral disc disorders with radiculopathy, lumbosacral region: Secondary | ICD-10-CM | POA: Diagnosis not present

## 2014-10-18 DIAGNOSIS — M19012 Primary osteoarthritis, left shoulder: Secondary | ICD-10-CM | POA: Diagnosis not present

## 2014-10-18 DIAGNOSIS — G903 Multi-system degeneration of the autonomic nervous system: Secondary | ICD-10-CM | POA: Diagnosis not present

## 2014-10-22 DIAGNOSIS — G20 Parkinson's disease: Secondary | ICD-10-CM

## 2014-10-23 DIAGNOSIS — I10 Essential (primary) hypertension: Secondary | ICD-10-CM | POA: Diagnosis not present

## 2014-10-23 DIAGNOSIS — G903 Multi-system degeneration of the autonomic nervous system: Secondary | ICD-10-CM | POA: Diagnosis not present

## 2014-10-23 DIAGNOSIS — M19012 Primary osteoarthritis, left shoulder: Secondary | ICD-10-CM | POA: Diagnosis not present

## 2014-10-23 DIAGNOSIS — I2510 Atherosclerotic heart disease of native coronary artery without angina pectoris: Secondary | ICD-10-CM | POA: Diagnosis not present

## 2014-10-23 DIAGNOSIS — M5117 Intervertebral disc disorders with radiculopathy, lumbosacral region: Secondary | ICD-10-CM | POA: Diagnosis not present

## 2014-10-23 DIAGNOSIS — Z9181 History of falling: Secondary | ICD-10-CM | POA: Diagnosis not present

## 2014-10-24 ENCOUNTER — Ambulatory Visit: Payer: Medicare Other | Admitting: Rehabilitative and Restorative Service Providers"

## 2014-10-28 DIAGNOSIS — I2510 Atherosclerotic heart disease of native coronary artery without angina pectoris: Secondary | ICD-10-CM | POA: Diagnosis not present

## 2014-10-28 DIAGNOSIS — M5117 Intervertebral disc disorders with radiculopathy, lumbosacral region: Secondary | ICD-10-CM | POA: Diagnosis not present

## 2014-10-28 DIAGNOSIS — I10 Essential (primary) hypertension: Secondary | ICD-10-CM | POA: Diagnosis not present

## 2014-10-28 DIAGNOSIS — Z9181 History of falling: Secondary | ICD-10-CM | POA: Diagnosis not present

## 2014-10-28 DIAGNOSIS — M19012 Primary osteoarthritis, left shoulder: Secondary | ICD-10-CM | POA: Diagnosis not present

## 2014-10-28 DIAGNOSIS — G903 Multi-system degeneration of the autonomic nervous system: Secondary | ICD-10-CM | POA: Diagnosis not present

## 2014-11-06 ENCOUNTER — Other Ambulatory Visit: Payer: Self-pay | Admitting: Internal Medicine

## 2014-11-06 DIAGNOSIS — G903 Multi-system degeneration of the autonomic nervous system: Secondary | ICD-10-CM | POA: Diagnosis not present

## 2014-11-06 DIAGNOSIS — M19012 Primary osteoarthritis, left shoulder: Secondary | ICD-10-CM | POA: Diagnosis not present

## 2014-11-06 DIAGNOSIS — I2510 Atherosclerotic heart disease of native coronary artery without angina pectoris: Secondary | ICD-10-CM | POA: Diagnosis not present

## 2014-11-06 DIAGNOSIS — M5117 Intervertebral disc disorders with radiculopathy, lumbosacral region: Secondary | ICD-10-CM | POA: Diagnosis not present

## 2014-11-06 DIAGNOSIS — I10 Essential (primary) hypertension: Secondary | ICD-10-CM | POA: Diagnosis not present

## 2014-11-06 DIAGNOSIS — Z9181 History of falling: Secondary | ICD-10-CM | POA: Diagnosis not present

## 2014-11-13 DIAGNOSIS — I10 Essential (primary) hypertension: Secondary | ICD-10-CM | POA: Diagnosis not present

## 2014-11-13 DIAGNOSIS — M19012 Primary osteoarthritis, left shoulder: Secondary | ICD-10-CM | POA: Diagnosis not present

## 2014-11-13 DIAGNOSIS — G903 Multi-system degeneration of the autonomic nervous system: Secondary | ICD-10-CM | POA: Diagnosis not present

## 2014-11-13 DIAGNOSIS — M5117 Intervertebral disc disorders with radiculopathy, lumbosacral region: Secondary | ICD-10-CM | POA: Diagnosis not present

## 2014-11-13 DIAGNOSIS — I2510 Atherosclerotic heart disease of native coronary artery without angina pectoris: Secondary | ICD-10-CM | POA: Diagnosis not present

## 2014-11-13 DIAGNOSIS — Z9181 History of falling: Secondary | ICD-10-CM | POA: Diagnosis not present

## 2014-11-19 DIAGNOSIS — M5117 Intervertebral disc disorders with radiculopathy, lumbosacral region: Secondary | ICD-10-CM | POA: Diagnosis not present

## 2014-11-19 DIAGNOSIS — G903 Multi-system degeneration of the autonomic nervous system: Secondary | ICD-10-CM | POA: Diagnosis not present

## 2014-11-19 DIAGNOSIS — I2510 Atherosclerotic heart disease of native coronary artery without angina pectoris: Secondary | ICD-10-CM | POA: Diagnosis not present

## 2014-11-19 DIAGNOSIS — M19012 Primary osteoarthritis, left shoulder: Secondary | ICD-10-CM | POA: Diagnosis not present

## 2014-11-19 DIAGNOSIS — Z9181 History of falling: Secondary | ICD-10-CM | POA: Diagnosis not present

## 2014-11-19 DIAGNOSIS — I10 Essential (primary) hypertension: Secondary | ICD-10-CM | POA: Diagnosis not present

## 2014-11-20 ENCOUNTER — Encounter: Payer: Self-pay | Admitting: Neurology

## 2014-11-21 ENCOUNTER — Ambulatory Visit: Payer: Medicare Other | Attending: Neurology | Admitting: Physical Therapy

## 2014-11-21 DIAGNOSIS — M6281 Muscle weakness (generalized): Secondary | ICD-10-CM

## 2014-11-21 NOTE — Therapy (Signed)
[facility] [name] [place], [place], [address] Phone: [phone]   Fax:  [phone]  Patient Details  Name: [name] [name] MRN: [id number] Date of Birth: Jan 07, 1935 Referring Provider:  Biagio Borg, MD  Encounter Date: 11/21/2014 Pt. was seen for power wheelchair evaluation with vendor Josh Cadle,  ATP from [facility]; see scanned assessment for details.  Alda Lea, PT 11/21/2014, 5:09 PM  [facility] [address] [place], [place], [address] Phone: [phone]   Fax:  [phone]

## 2014-11-25 ENCOUNTER — Other Ambulatory Visit: Payer: Self-pay | Admitting: Neurology

## 2014-11-26 DIAGNOSIS — G903 Multi-system degeneration of the autonomic nervous system: Secondary | ICD-10-CM | POA: Diagnosis not present

## 2014-11-26 DIAGNOSIS — M5117 Intervertebral disc disorders with radiculopathy, lumbosacral region: Secondary | ICD-10-CM | POA: Diagnosis not present

## 2014-11-26 DIAGNOSIS — M19012 Primary osteoarthritis, left shoulder: Secondary | ICD-10-CM | POA: Diagnosis not present

## 2014-11-26 DIAGNOSIS — I10 Essential (primary) hypertension: Secondary | ICD-10-CM | POA: Diagnosis not present

## 2014-11-26 DIAGNOSIS — I2510 Atherosclerotic heart disease of native coronary artery without angina pectoris: Secondary | ICD-10-CM | POA: Diagnosis not present

## 2014-11-26 DIAGNOSIS — Z9181 History of falling: Secondary | ICD-10-CM | POA: Diagnosis not present

## 2014-11-27 ENCOUNTER — Telehealth: Payer: Self-pay | Admitting: Internal Medicine

## 2014-11-27 MED ORDER — SOLIFENACIN SUCCINATE 5 MG PO TABS: 5.0000 mg | ORAL_TABLET | Freq: Every day | ORAL | Status: DC

## 2014-11-27 NOTE — Telephone Encounter (Signed)
Pt request refill for solifenacin (VESICARE) to be send to [facility].

## 2014-12-03 ENCOUNTER — Telehealth: Payer: Self-pay | Admitting: *Deleted

## 2014-12-03 DIAGNOSIS — I2510 Atherosclerotic heart disease of native coronary artery without angina pectoris: Secondary | ICD-10-CM | POA: Diagnosis not present

## 2014-12-03 DIAGNOSIS — M19012 Primary osteoarthritis, left shoulder: Secondary | ICD-10-CM | POA: Diagnosis not present

## 2014-12-03 DIAGNOSIS — I10 Essential (primary) hypertension: Secondary | ICD-10-CM | POA: Diagnosis not present

## 2014-12-03 DIAGNOSIS — M5117 Intervertebral disc disorders with radiculopathy, lumbosacral region: Secondary | ICD-10-CM | POA: Diagnosis not present

## 2014-12-03 DIAGNOSIS — Z9181 History of falling: Secondary | ICD-10-CM | POA: Diagnosis not present

## 2014-12-03 DIAGNOSIS — G903 Multi-system degeneration of the autonomic nervous system: Secondary | ICD-10-CM | POA: Diagnosis not present

## 2014-12-03 NOTE — Telephone Encounter (Signed)
[name] [name] with [facility] here for information re: pt and face to face eval from when in last 10-01-14.  Gave to her.  Pt had PT eval and will send this to [place] for Dr. Edwena Felty signature.

## 2014-12-11 DIAGNOSIS — I2510 Atherosclerotic heart disease of native coronary artery without angina pectoris: Secondary | ICD-10-CM | POA: Diagnosis not present

## 2014-12-11 DIAGNOSIS — I10 Essential (primary) hypertension: Secondary | ICD-10-CM | POA: Diagnosis not present

## 2014-12-11 DIAGNOSIS — Z9181 History of falling: Secondary | ICD-10-CM | POA: Diagnosis not present

## 2014-12-11 DIAGNOSIS — M19012 Primary osteoarthritis, left shoulder: Secondary | ICD-10-CM | POA: Diagnosis not present

## 2014-12-11 DIAGNOSIS — M5117 Intervertebral disc disorders with radiculopathy, lumbosacral region: Secondary | ICD-10-CM | POA: Diagnosis not present

## 2014-12-11 DIAGNOSIS — G903 Multi-system degeneration of the autonomic nervous system: Secondary | ICD-10-CM | POA: Diagnosis not present

## 2014-12-12 ENCOUNTER — Encounter: Payer: Self-pay | Admitting: Internal Medicine

## 2014-12-12 ENCOUNTER — Ambulatory Visit (INDEPENDENT_AMBULATORY_CARE_PROVIDER_SITE_OTHER): Payer: Medicare Other | Admitting: Internal Medicine

## 2014-12-12 VITALS — BP 124/78 | HR 90 | Temp 99.0°F | Ht 69.0 in | Wt 219.5 lb

## 2014-12-12 DIAGNOSIS — J209 Acute bronchitis, unspecified: Secondary | ICD-10-CM

## 2014-12-12 DIAGNOSIS — R062 Wheezing: Secondary | ICD-10-CM | POA: Diagnosis not present

## 2014-12-12 DIAGNOSIS — I10 Essential (primary) hypertension: Secondary | ICD-10-CM

## 2014-12-12 MED ORDER — PREDNISONE 10 MG PO TABS: ORAL_TABLET | ORAL | Status: DC

## 2014-12-12 MED ORDER — MOXIFLOXACIN HCL 400 MG PO TABS: 400.0000 mg | ORAL_TABLET | Freq: Every day | ORAL | Status: DC

## 2014-12-12 MED ORDER — METHYLPREDNISOLONE ACETATE 80 MG/ML IJ SUSP
80.0000 mg | Freq: Once | INTRAMUSCULAR | Status: AC
  Administered 2014-12-12: 80 mg via INTRAMUSCULAR

## 2014-12-12 MED ORDER — ALBUTEROL SULFATE HFA 108 (90 BASE) MCG/ACT IN AERS: 2.0000 | INHALATION_SPRAY | RESPIRATORY_TRACT | Status: DC | PRN

## 2014-12-12 MED ORDER — CEFTRIAXONE SODIUM 1 G IJ SOLR
1.0000 g | Freq: Once | INTRAMUSCULAR | Status: AC
  Administered 2014-12-12: 1 g via INTRAMUSCULAR

## 2014-12-12 MED ORDER — HYDROCODONE-HOMATROPINE 5-1.5 MG/5ML PO SYRP: 5.0000 mL | ORAL_SOLUTION | Freq: Four times a day (QID) | ORAL | Status: DC | PRN

## 2014-12-12 NOTE — Progress Notes (Signed)
Pre visit review using our clinic review tool, if applicable. No additional management support is needed unless otherwise documented below in the visit note. 

## 2014-12-12 NOTE — Assessment & Plan Note (Signed)
stable overall by history and exam, recent data reviewed with pt, and pt to continue medical treatment as before,  to f/u any worsening symptoms or concerns BP Readings from Last 3 Encounters:  12/12/14 124/78  10/01/14 137/75  09/19/14 142/82

## 2014-12-12 NOTE — Patient Instructions (Addendum)
You had the steroid shot today, and the antibiotic shot (rocephin)  Please take all new medication as prescribed - the avelox, cough medicine, and prednisone  Please continue all other medications as before, and refills have been done if requested - the proair  Please have the pharmacy call with any other refills you may need.  Please keep your appointments with your specialists as you may have planned

## 2014-12-12 NOTE — Assessment & Plan Note (Signed)
Mild to mod, for antibx course,  to f/u any worsening symptoms or concerns, cant r/o pna but wife declines ER

## 2014-12-12 NOTE — Assessment & Plan Note (Signed)
Suspect bronchospasm related, for depomedrol IM, predpac asd,  to f/u any worsening symptoms or concerns

## 2014-12-12 NOTE — Progress Notes (Signed)
Subjective:    Patient ID: [name] [name], male    DOB: [birth year], 80 y.o.   MRN: [id number]  HPI Here with acute onset mild to mod 2-3 days ST, HA, general weakness and malaise, with prod cough greenish sputum, but Pt denies chest pain, increased sob or doe, wheezing, orthopnea, PND, increased LE swelling, palpitations, dizziness or syncope, except for onset mild wheezing/sob since last PM.. Remains wheelchair bound - Unable to have cxr here.   Past Medical History  Diagnosis Date  . ONYCHOMYCOSIS, TOENAILS 06/27/2008  . HYPERLIPIDEMIA 07/28/2007  . ANXIETY 09/28/2007  . OBSTRUCTIVE SLEEP APNEA 01/01/2008  . RESTLESS LEGS SYNDROME 12/26/2007  . HEARING LOSS 06/27/2008  . HYPERTENSION 07/28/2007  . MYOCARDIAL INFARCTION, HX OF 07/28/2007  . CORONARY ARTERY DISEASE 07/28/2007  . Atrial fibrillation 09/28/2007  . SINUSITIS- ACUTE-NOS 10/10/2007  . ALLERGIC RHINITIS 09/28/2007  . CHOLELITHIASIS 09/28/2007  . BENIGN PROSTATIC HYPERTROPHY 09/28/2007  . CELLULITIS, HAND, LEFT 09/24/2010  . SYNCOPE 07/28/2007  . INSOMNIA-SLEEP DISORDER-UNSPEC 04/03/2009  . HYPERSOMNIA WITH SLEEP APNEA UNSPECIFIED 12/26/2007  . FATIGUE 01/01/2008  . DYSPNEA 04/03/2009  . Wheezing 11/06/2009  . ANGIOEDEMA 11/06/2009  . COLON CANCER, HX OF 07/28/2007  . PROSTATE CANCER, HX OF 07/28/2007  . COLONIC POLYPS, HX OF 07/28/2007  . Long term current use of anticoagulant 12/22/2010  . Asthma   . DEMENTIA   . Spinal stenosis   . History of atrial fibrillation   . Lumbar stenosis 10/30/2011  . CAD (coronary artery disease)     Severe three-vessel coronary disease. LIMA to the LAD patent. SVG to PDA patent. SVG to OM 3 patent. Preserved ejection fraction. 2008.  . Cancer   . Thyroid disease   . Arthritis   . Pneumonia   . 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 Sepsis with acute organ dysfunction 09/21/2011  . 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  Cervical disc disorder with radiculopathy of cervical region 04/15/2014  . 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 Multiple system atrophy, Parkinson variant 10/01/2014   Past Surgical History  Procedure Laterality Date  . Prostatectomy  2001  . Cardioversion  2007  . Stress cardiolite  02/08/2007  . Schocardiograph  10/07/2006  . Right thyroid lobectomy    . Coronary artery bypass graft  1995    x 3  . Hernia repair  1998  . Colonoscopy  2010    last done 2010- clear  . Cardiac electrophysiology study and ablation    . Thyroidectomy, partial      reports that he has never smoked. He has never used smokeless tobacco. He reports that he does not drink alcohol or use illicit drugs. family history includes Coronary artery disease in his father and sister; Hypertension in his other. Allergies  Allergen Reactions  . Lisinopril Swelling  . Statins     parkinsons   . Thorazine [Chlorpromazine]     Confusion/loopy   Current Outpatient Prescriptions on File Prior to Visit  Medication Sig Dispense Refill  . acetaminophen (TYLENOL) 500 MG tablet Take 500 mg by mouth every 4 (four) hours as needed.    . ALPRAZolam (XANAX) 0.5 MG tablet Take 0.5 mg by mouth daily as needed for anxiety. As needed    . amLODipine (NORVASC) 5 MG tablet Take 1 tablet (5 mg total) by mouth daily. 90 tablet 3  . amLODipine (NORVASC) 5 MG tablet TAKE 1  TABLET DAILY 90 tablet 3  . apixaban (ELIQUIS) 5 MG TABS tablet Take 1 tablet (5 mg total) by mouth 2 (two) times daily. 180 tablet 3  . buPROPion (WELLBUTRIN XL) 150 MG 24 hr tablet Take 1 tablet (150 mg total) by mouth daily. 90 tablet 3  . carbidopa-levodopa (SINEMET IR) 25-100 MG per tablet Take 1.5 tablets by mouth 4 (four) times daily. At 10AM, 2PM, 6PM AND 10PM 540 tablet 1  . Cholecalciferol (VITAMIN D3) 2000 UNITS TABS Take 2 tablets by mouth daily.     . clonazePAM (KLONOPIN) 1 MG tablet Take 2 tablets (2 mg total) by mouth at bedtime. Can take  1/2 tablet more, if having difficulty sleeping. 60 tablet 3  . Coenzyme Q10 (CO Q 10 PO) Take 1 tablet by mouth at bedtime.    Marland Kitchen desloratadine (CLARINEX) 5 MG tablet Take 1 tablet (5 mg total) by mouth daily. 90 tablet 3  . docusate sodium (COLACE) 100 MG capsule Take 300 mg by mouth at bedtime.     . donepezil (ARICEPT) 23 MG TABS tablet TAKE 1 TABLET EVERY MORNING 90 tablet 1  . FLUoxetine (PROZAC) 40 MG capsule Take 1 capsule (40 mg total) by mouth every morning. 90 capsule 3  . fluticasone (VERAMYST) 27.5 MCG/SPRAY nasal spray Place 1 spray into the nose 2 (two) times daily.    . Fluticasone-Salmeterol (ADVAIR) 250-50 MCG/DOSE AEPB Inhale 1 puff into the lungs every 12 (twelve) hours. As needed    . guaiFENesin (ROBITUSSIN) 100 MG/5ML liquid Take 200 mg by mouth 4 (four) times daily as needed for cough.    Marland Kitchen ibuprofen (ADVIL,MOTRIN) 200 MG tablet Take 400 mg by mouth every 6 (six) hours as needed for pain.    Marland Kitchen L-Methylfolate-B12-B6-B2 (CEREFOLIN) 04-08-49-5 MG TABS TAKE 1 TABLET DAILY 90 each 3  . levocetirizine (XYZAL) 5 MG tablet Take 1 tablet (5 mg total) by mouth every evening. 90 tablet 3  . Multiple Vitamin (MULTIVITAMINS PO) Take 1 tablet by mouth at bedtime.     . Olopatadine HCl (PATADAY) 0.2 % SOLN Apply to eye as needed (both eyes).     . primidone (MYSOLINE) 50 MG tablet TAKE 1 TABLET TWICE A DAY  AS DIRECTED 180 tablet 2  . solifenacin (VESICARE) 5 MG tablet Take 1 tablet (5 mg total) by mouth daily. 90 tablet 3   No current facility-administered medications on file prior to visit.      Review of Systems  Constitutional: Negative for unusual diaphoresis or other sweats  HENT: Negative for ringing in ear Eyes: Negative for double vision or worsening visual disturbance.  Respiratory: Negative for choking and stridor.   Gastrointestinal: Negative for vomiting or other signifcant bowel change Genitourinary: Negative for hematuria or decreased urine volume.  Musculoskeletal:  Negative for other MSK pain or swelling Skin: Negative for color change and worsening wound.  Neurological: Negative for tremors and numbness other than noted  Psychiatric/Behavioral: Negative for decreased concentration or agitation other than above       Objective:   Physical Exam BP 124/78 mmHg  Pulse 90  Temp(Src) 99 F (37.2 C) (Oral)  Ht 5\' 9"  (1.753 m)  Wt 219 lb 8 oz (99.565 kg)  BMI 32.40 kg/m2  SpO2 94% VS noted, mild ill Constitutional: Pt appears well-developed, well-nourished.  HENT: Head: NCAT.  Right Ear: External ear normal.  Left Ear: External ear normal.  Bilat tm's with mild erythema.  Max sinus areas non tender.  Pharynx with mild  erythema, no exudate Eyes: . Pupils are equal, round, and reactive to light. Conjunctivae  and EOM are normal Neck: Normal range of motion. Neck supple.  Cardiovascular: Normal rate and regular rhythm.   Pulmonary/Chest: Effort normal and breath sounds decreased bilat with bibas few rales,  And few bilat wheezing, no rhonchi  Abd:  Soft, NT, ND, + BS Neurological: Pt is alert. Not confused , motor grossly intact Skin: Skin is warm. No rash Psychiatric: Pt behavior is normal. No agitation.     Assessment & Plan:

## 2014-12-25 ENCOUNTER — Other Ambulatory Visit: Payer: Self-pay | Admitting: Internal Medicine

## 2014-12-30 ENCOUNTER — Telehealth: Payer: Self-pay | Admitting: Internal Medicine

## 2014-12-30 NOTE — Telephone Encounter (Signed)
Patient's pharmacy called wanting to know if the patient is on xyzal or clarinex or both. Please clarify with [facility] [phone] ref # [id number]

## 2014-12-31 ENCOUNTER — Other Ambulatory Visit: Payer: Self-pay | Admitting: Internal Medicine

## 2014-12-31 MED ORDER — LEVOCETIRIZINE DIHYDROCHLORIDE 5 MG PO TABS: 5.0000 mg | ORAL_TABLET | Freq: Every evening | ORAL | Status: DC

## 2014-12-31 NOTE — Telephone Encounter (Signed)
For xyzal only, since this is usually more effective

## 2014-12-31 NOTE — Telephone Encounter (Signed)
Pharmacy is informed.

## 2015-01-07 ENCOUNTER — Telehealth: Payer: Self-pay

## 2015-01-07 NOTE — Telephone Encounter (Signed)
Pt's wife wants an order for an internal catheter instead of the condom catheter. Wife stays that it is becoming messy and the husband does not have the ability to tell when he has the urge to use the restroom. She mentioned that they are currently using [employer] for other supplies in the home and would like to use them for the catheter service.

## 2015-01-07 NOTE — Telephone Encounter (Signed)
Very sorry, but the risk of infection becomes very high for a catheter in the bladder for more than 3 days, this would not be advised

## 2015-01-08 NOTE — Telephone Encounter (Signed)
Notified pt wife with md response.../lmb 

## 2015-01-27 ENCOUNTER — Telehealth: Payer: Self-pay | Admitting: *Deleted

## 2015-01-27 DIAGNOSIS — G232 Striatonigral degeneration: Secondary | ICD-10-CM

## 2015-01-27 DIAGNOSIS — G4752 REM sleep behavior disorder: Secondary | ICD-10-CM

## 2015-01-27 MED ORDER — CLONAZEPAM 1 MG PO TABS: 2.0000 mg | ORAL_TABLET | Freq: Every day | ORAL | Status: DC

## 2015-01-27 NOTE — Telephone Encounter (Signed)
I called and spoke to wife of pt and relayed need to reschedule appt from 03-03-15 to another time date, may see NP.  Rescheduled to 01-29-15 with MM/NP for 5 mo RV.  Wife asked about Clonazepam 2mg  po qhs and may have another 1/2 tablet prn.  Called in order since [facility] [address] did not have prescription.  # 60 no refill.  Can refill mail order when in the office.

## 2015-01-28 ENCOUNTER — Ambulatory Visit (INDEPENDENT_AMBULATORY_CARE_PROVIDER_SITE_OTHER): Payer: Medicare Other | Admitting: Internal Medicine

## 2015-01-28 VITALS — BP 118/80 | HR 82 | Temp 99.1°F | Resp 14

## 2015-01-28 DIAGNOSIS — R05 Cough: Secondary | ICD-10-CM

## 2015-01-28 DIAGNOSIS — R059 Cough, unspecified: Secondary | ICD-10-CM | POA: Insufficient documentation

## 2015-01-28 DIAGNOSIS — J309 Allergic rhinitis, unspecified: Secondary | ICD-10-CM

## 2015-01-28 DIAGNOSIS — J4531 Mild persistent asthma with (acute) exacerbation: Secondary | ICD-10-CM | POA: Diagnosis not present

## 2015-01-28 DIAGNOSIS — N320 Bladder-neck obstruction: Secondary | ICD-10-CM

## 2015-01-28 DIAGNOSIS — E785 Hyperlipidemia, unspecified: Secondary | ICD-10-CM | POA: Diagnosis not present

## 2015-01-28 MED ORDER — FLUTICASONE FUROATE 27.5 MCG/SPRAY NA SUSP: 1.0000 | Freq: Two times a day (BID) | NASAL | Status: DC

## 2015-01-28 MED ORDER — PREDNISONE 10 MG PO TABS: ORAL_TABLET | ORAL | Status: DC

## 2015-01-28 MED ORDER — OLOPATADINE HCL 0.2 % OP SOLN: 1.0000 [drp] | OPHTHALMIC | Status: DC | PRN

## 2015-01-28 MED ORDER — METHYLPREDNISOLONE ACETATE 80 MG/ML IJ SUSP
80.0000 mg | Freq: Once | INTRAMUSCULAR | Status: AC
  Administered 2015-01-28: 80 mg via INTRAMUSCULAR

## 2015-01-28 MED ORDER — AZITHROMYCIN 250 MG PO TABS: ORAL_TABLET | ORAL | Status: DC

## 2015-01-28 MED ORDER — HYDROCODONE-HOMATROPINE 5-1.5 MG/5ML PO SYRP: 5.0000 mL | ORAL_SOLUTION | Freq: Four times a day (QID) | ORAL | Status: DC | PRN

## 2015-01-28 NOTE — Patient Instructions (Signed)
You had the steroid shot today  Please take all new medication as prescribed - the anitbiotic and prednisone (sent to the pharmacy)  Please continue all other medications as before, and refills have been done if requested.  Please have the pharmacy call with any other refills you may need.  Please continue your efforts at being more active, low cholesterol diet, and weight control.  You are otherwise up to date with prevention measures today.  Please keep your appointments with your specialists as you may have planned  Please go to the LAB in the Basement (turn left off the elevator) for the tests to be done today  You will be contacted by phone if any changes need to be made immediately.  Otherwise, you will receive a letter about your results with an explanation, but please check with MyChart first.  Please remember to sign up for MyChart if you have not done so, as this will be important to you in the future with finding out test results, communicating by private email, and scheduling acute appointments online when needed.  Please return in 6 months, or sooner if needed

## 2015-01-28 NOTE — Assessment & Plan Note (Signed)
Bronchitis vs pna , delcines cxr, with low grade temp, prodcough - for zpack

## 2015-01-28 NOTE — Progress Notes (Signed)
Subjective:    Patient ID: [name] [name], male    DOB: 1935-08-25, 80 y.o.   MRN: [id number]  HPI  Here for yearly f/u;  Overall doing ok;  Pt denies Chest pain, worsening SOB, DOE, orthopnea, PND, worsening LE edema, palpitations, dizziness or syncope except for 4 days onset wheezing, some improved with re-starting his home cough syrup and inhaler., also with cough greenish sputum    Pt denies neurological change such as new headache, facial or extremity weakness.  Pt denies polydipsia, polyuria, or low sugar symptoms. Pt states overall good compliance with treatment and medications, good tolerability, and has been trying to follow appropriate diet.  Pt denies worsening depressive symptoms, suicidal ideation or panic. No fever, night sweats, wt loss, loss of appetite, or other constitutional symptoms.  Pt states good ability with ADL's, has low fall risk, home safety reviewed and adequate, no other significant changes in hearing or vision, and only occasionally active with exercise. Denies worsening reflux, abd pain, dysphagia, n/v, or blood but has had mild diarrhea intermittent for 1 wk, better with pedialyte  Wheelchair bound. Has some venous insiff type swelling at end of day most days, desptie socks and current meds.  Wearing condom catheter for several months .  Had PT a few months ago, stopped due to nonprogression Does have several wks ongoing nasal allergy symptoms with clearish congestion, itch and sneezing Past Medical History  Diagnosis Date  . ONYCHOMYCOSIS, TOENAILS 06/27/2008  . HYPERLIPIDEMIA 07/28/2007  . ANXIETY 09/28/2007  . OBSTRUCTIVE SLEEP APNEA 01/01/2008  . RESTLESS LEGS SYNDROME 12/26/2007  . HEARING LOSS 06/27/2008  . HYPERTENSION 07/28/2007  . MYOCARDIAL INFARCTION, HX OF 07/28/2007  . CORONARY ARTERY DISEASE 07/28/2007  . Atrial fibrillation 09/28/2007  . SINUSITIS- ACUTE-NOS 10/10/2007  . ALLERGIC RHINITIS 09/28/2007  . CHOLELITHIASIS 09/28/2007  . BENIGN PROSTATIC  HYPERTROPHY 09/28/2007  . CELLULITIS, HAND, LEFT 09/24/2010  . SYNCOPE 07/28/2007  . INSOMNIA-SLEEP DISORDER-UNSPEC 04/03/2009  . HYPERSOMNIA WITH SLEEP APNEA UNSPECIFIED 12/26/2007  . FATIGUE 01/01/2008  . DYSPNEA 04/03/2009  . Wheezing 11/06/2009  . ANGIOEDEMA 11/06/2009  . COLON CANCER, HX OF 07/28/2007  . PROSTATE CANCER, HX OF 07/28/2007  . COLONIC POLYPS, HX OF 07/28/2007  . Long term current use of anticoagulant 12/22/2010  . Asthma   . DEMENTIA   . Spinal stenosis   . History of atrial fibrillation   . Lumbar stenosis 10/30/2011  . CAD (coronary artery disease)     Severe three-vessel coronary disease. LIMA to the LAD patent. SVG to PDA patent. SVG to OM 3 patent. Preserved ejection fraction. 2008.  . Cancer   . Thyroid disease   . Arthritis   . Pneumonia   . 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 Sepsis with acute organ dysfunction 09/21/2011  . 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 Cervical disc disorder with radiculopathy of cervical region 04/15/2014  . 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 Multiple system atrophy, Parkinson variant 10/01/2014   Past Surgical History  Procedure Laterality Date  . Prostatectomy  2001  . Cardioversion  2007  . Stress cardiolite  02/08/2007  . Schocardiograph  10/07/2006  . Right thyroid lobectomy    . Coronary artery bypass graft  1995    x 3  . Hernia repair  1998  . Colonoscopy  2010    last done 2010- clear  . Cardiac electrophysiology study and ablation    .  Thyroidectomy, partial      reports that he has never smoked. He has never used smokeless tobacco. He reports that he does not drink alcohol or use illicit drugs. family history includes Coronary artery disease in his father and sister; Hypertension in his other. Allergies  Allergen Reactions  . Lisinopril Swelling  . Statins     parkinsons   . Thorazine [Chlorpromazine]      Confusion/loopy   Current Outpatient Prescriptions on File Prior to Visit  Medication Sig Dispense Refill  . acetaminophen (TYLENOL) 500 MG tablet Take 500 mg by mouth every 4 (four) hours as needed.    Marland Kitchen albuterol (PROAIR HFA) 108 (90 BASE) MCG/ACT inhaler Inhale 2 puffs into the lungs every 4 (four) hours as needed. For wheezing 3 Inhaler 3  . ALPRAZolam (XANAX) 0.5 MG tablet Take 0.5 mg by mouth daily as needed for anxiety. As needed    . amLODipine (NORVASC) 5 MG tablet Take 1 tablet (5 mg total) by mouth daily. 90 tablet 3  . buPROPion (WELLBUTRIN XL) 150 MG 24 hr tablet Take 1 tablet (150 mg total) by mouth daily. 90 tablet 3  . carbidopa-levodopa (SINEMET IR) 25-100 MG per tablet Take 1.5 tablets by mouth 4 (four) times daily. At 10AM, 2PM, 6PM AND 10PM 540 tablet 1  . Cholecalciferol (VITAMIN D3) 2000 UNITS TABS Take 1 tablet by mouth daily.     . clonazePAM (KLONOPIN) 1 MG tablet Take 2 tablets (2 mg total) by mouth at bedtime. Can take 1/2 tablet more, if having difficulty sleeping. 60 tablet 0  . Coenzyme Q10 (CO Q 10 PO) Take 1 tablet by mouth at bedtime.    Marland Kitchen desloratadine (CLARINEX) 5 MG tablet Take 1 tablet (5 mg total) by mouth daily. 90 tablet 3  . docusate sodium (COLACE) 100 MG capsule Take 300 mg by mouth at bedtime.     . donepezil (ARICEPT) 23 MG TABS tablet TAKE 1 TABLET EVERY MORNING 90 tablet 1  . ELIQUIS 5 MG TABS tablet TAKE 1 TABLET TWICE A DAY 180 tablet 3  . FLUoxetine (PROZAC) 40 MG capsule Take 1 capsule (40 mg total) by mouth every morning. 90 capsule 3  . Fluticasone-Salmeterol (ADVAIR) 250-50 MCG/DOSE AEPB Inhale 1 puff into the lungs every 12 (twelve) hours. As needed    . guaiFENesin (ROBITUSSIN) 100 MG/5ML liquid Take 200 mg by mouth 4 (four) times daily as needed for cough.    Marland Kitchen HYDROcodone-homatropine (HYCODAN) 5-1.5 MG/5ML syrup Take 5 mLs by mouth every 6 (six) hours as needed for cough. 240 mL 0  . ibuprofen (ADVIL,MOTRIN) 200 MG tablet Take 400 mg by  mouth every 6 (six) hours as needed for pain.    Marland Kitchen L-Methylfolate-B12-B6-B2 (CEREFOLIN) 04-08-49-5 MG TABS TAKE 1 TABLET DAILY 90 each 3  . levocetirizine (XYZAL) 5 MG tablet TAKE 1 TABLET EVERY EVENING 90 tablet 3  . levocetirizine (XYZAL) 5 MG tablet Take 1 tablet (5 mg total) by mouth every evening. 90 tablet 3  . moxifloxacin (AVELOX) 400 MG tablet Take 1 tablet (400 mg total) by mouth daily. 10 tablet 0  . Multiple Vitamin (MULTIVITAMINS PO) Take 1 tablet by mouth at bedtime.     . predniSONE (DELTASONE) 10 MG tablet 3 tabs by mouth per day for 3 days,2tabs per day for 3 days,1tab per day for 3 days 18 tablet 0  . primidone (MYSOLINE) 50 MG tablet TAKE 1 TABLET TWICE A DAY  AS DIRECTED 180 tablet 2  . solifenacin (  VESICARE) 5 MG tablet Take 1 tablet (5 mg total) by mouth daily. 90 tablet 3   No current facility-administered medications on file prior to visit.     Review of Systems Constitutional: Negative for increased diaphoresis, other activity, appetite or siginficant weight change other than noted HENT: Negative for worsening hearing loss, ear pain, facial swelling, mouth sores and neck stiffness.   Eyes: Negative for other worsening pain, redness or visual disturbance.  Respiratory: Negative for shortness of breath and wheezing  Cardiovascular: Negative for chest pain and palpitations.  Gastrointestinal: Negative for diarrhea, blood in stool, abdominal distention or other pain Genitourinary: Negative for hematuria, flank pain or change in urine volume.  Musculoskeletal: Negative for myalgias or other joint complaints.  Skin: Negative for color change and wound or drainage.  Neurological: Negative for syncope and numbness. other than noted Hematological: Negative for adenopathy. or other swelling Psychiatric/Behavioral: Negative for hallucinations, SI, self-injury, decreased concentration or other worsening agitation.      Objective:   Physical Exam BP 118/80 mmHg  Pulse 82   Temp(Src) 99.1 F (37.3 C) (Oral)  Resp 14  SpO2 94% VS noted,  Constitutional: Pt is oriented to person, place, and time. Appears well-developed and well-nourished, in no significant distress Head: Normocephalic and atraumatic.  Right Ear: External ear normal.  Left Ear: External ear normal.  Nose: Nose normal.  Bilat tm's with mild erythema.  Max sinus areas mild tender.  Pharynx with mild erythema, no exudate Mouth/Throat: Oropharynx is clear and moist.  Eyes: Conjunctivae and EOM are normal. Pupils are equal, round, and reactive to light.  Neck: Normal range of motion. Neck supple. No JVD present. No tracheal deviation present or significant neck LA or mass Cardiovascular: Normal rate, regular rhythm, normal heart sounds and intact distal pulses.   Pulmonary/Chest: Effort normal and breath sounds decresaed 0without rales but _+ few  wheezing  Abdominal: Soft. Bowel sounds are normal. NT. No HSM  Musculoskeletal: Normal range of motion. Exhibits no edema.  Lymphadenopathy:  Has no cervical adenopathy.  Neurological: Pt is alert and oriented to person only  Pt has normal reflexes. No cranial nerve deficit. Motor grossly intact except marked LE weakness/disuse Skin: Skin is warm and dry. No rash noted.  Psychiatric:  Has fair mood and affect. Behavior is normal.     Assessment & Plan:

## 2015-01-28 NOTE — Assessment & Plan Note (Signed)
Mild to mod seasonal flare,  for depomedrol, predpas asd  to f/u any worsening symptoms or concerns

## 2015-01-28 NOTE — Assessment & Plan Note (Signed)
Also with seaosonal flare - for depomedrol, predpac asd

## 2015-01-29 ENCOUNTER — Ambulatory Visit (INDEPENDENT_AMBULATORY_CARE_PROVIDER_SITE_OTHER): Payer: Medicare Other | Admitting: Adult Health

## 2015-01-29 ENCOUNTER — Encounter: Payer: Self-pay | Admitting: Adult Health

## 2015-01-29 VITALS — BP 113/63 | HR 74 | Ht 69.0 in | Wt 230.0 lb

## 2015-01-29 DIAGNOSIS — G238 Other specified degenerative diseases of basal ganglia: Secondary | ICD-10-CM | POA: Diagnosis not present

## 2015-01-29 DIAGNOSIS — G232 Striatonigral degeneration: Secondary | ICD-10-CM

## 2015-01-29 DIAGNOSIS — R1310 Dysphagia, unspecified: Secondary | ICD-10-CM | POA: Diagnosis not present

## 2015-01-29 DIAGNOSIS — R269 Unspecified abnormalities of gait and mobility: Secondary | ICD-10-CM

## 2015-01-29 NOTE — Patient Instructions (Addendum)
Continue Sinemet and Klonopin.  I will order swallow evaluation If your symptoms worsen or you develop new symptoms please let us know.

## 2015-01-29 NOTE — Progress Notes (Signed)
PATIENT: [name] [name] [name] DOB: 1935/01/03  REASON FOR VISIT: follow up- parkinsons, REM HISTORY FROM: patient  HISTORY OF PRESENT ILLNESS: [name] [name] [name] is a 80 year old male with a history of Parkinson's/MSA. He returns today for follow-up. The wife states that since the last visit the patient has become confined to a wheelchair. He can bear weight for less than 30 seconds before his legs will give out on him. She states that the Sinemet has been working well for him. She states that in the past if he is listed as the tremor will return. The patient states that he isn't having difficulty with swallowing. In the past he had a home speech evaluation and they recommended that he avoid things such as rice, peas and corn. She states since then he tends to get choked on thin liquids. The patient has had several rounds of pneumonia. The patient currently is suffering from diarrhea he saw [name] [name] [name] and was prescribed antibiotics and prednisone. According to the wife they think that he may have pneumonia. The patient continues to take Klonopin for sleep. Wife reports that he has been sleeping well with this medication. The patient has applied for a power wheelchair. Unfortunately the insurance company lost his request and had to be re-filed. For now he is using a regular wheelchair and they have a lift at home to use for transfers. The patient often has incontinence of bowels and bladder. He currently wears a condom catheter. He returns today for follow-up.   HISTORY [facility]): Symptoms started in 2008, initially was confusion and memory loss. Initially diagnosed with Alzheimer's. They then noted a bilateral hand tremor, they are unsure if it is rest or action. Started having difficulty walking around 3 years ago. Noted difficulty lifting his feet off the ground, has a lot of freezing of his steps. Multiple falls, will trip over his feet. With the walker can walk up to 30 feet. Recently finished a course of PT,  finished around 3 weeks ago. Requesting referral for home PT.  1)Parkinson plus syndrome, Gait instability - FACE TO FACE ; he needs a power wheelchair for mobility assistance Legs give out. [place] today here in the practice during face to face evaluation. .  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 Takes klonopin which has calmed this down. Wife notes a stridor type noise when he is sleeping. Denies any loss of sense of smell. CPAP user, but CPAP broke.   Currently taking Sinemet 25-100 1.5 tabs 4x a day at 10am, 2pm, 6pm and 10pm. Feels he gets some benefit from it. If he misses a dose or gets it later then he will have more difficulty moving.   History per prior notes; [name] Like, DO  [name] [name] [name] is a very pleasant 80 yo RH gentleman who is seen on behalf of [name] [name] [name]. He is accompanied by his wife today. He has an underlying complex history including parkinsonism, dementia, anxiety, OSA, restless leg syndrome, hearing loss, hypertension, heart disease with status post MI and status post CABG, atrial fibrillation, history of syncope, insomnia, history of colon cancer, asthma, spinal stenosis, thyroid disease, arthritis, and has had recurrent falls. He started seeing [name] [name] [name] I believe in 2011 and was most recently seen by our nurse practitioner in February 2014, at which time he was continued on Sinemet 3 times a day and advised to reduce prednisone. His wife called today stating that to him he needed to be seen because of recurrent falls. His appointment  was canceled for last week and he was not yet rescheduled for another appointment. He was seen in the emergency room on 05/15/2013 after a fall. He had fallen backwards hitting his head on concrete while trying to get into his car. His wife also reported increasing confusion over the past for 5 days prior to your ED visit. He had a CT head which showed stable atrophy and white matter changes and no acute abnormality. He had chest x-ray which  was negative for pneumonia. Laboratory function showed normal electrolytes, normal CBC, normal urinalysis. His wife states, that is legs give out and he has a tendency to fall backwards. He has had PT before and last went in November. PT has helped in the past. She would [name] [name] a referral for PT again. He went to PT and speech rehab on [employer] [phone]).  He has RLS, RBD, OSA on CPAP. He has a very erratic sleep wake schedule. He and his wife go to bed late around 2 AM and he wakes up at 10, but goes back to sleep and over the course of the day, he takes at least 2 three-hour naps. He uses CPAP at night, but not for his naps.  C/L is helpful, but he does not usually takes the 3 doses. He often misses the last dose. He has no hallucinations.    REVIEW OF SYSTEMS: Out of a complete 14 system review of symptoms, the patient complains only of the following symptoms, and all other reviewed systems are negative.  Activity change, fatigue, excessive sweating, runny nose, trouble swallowing, eye itching, eye redness, wheezing, shortness of breath, choking, leg swelling, heat intolerance, rectal bleeding, diarrhea, restless leg, apnea, frequent waking, snoring, sleep talking, acting out dreams, environmental allergies, frequent infections, incontinence of bladder, urgency, joint pain, aching muscles, muscle cramps, walking difficulty, rash, bruise/bleed easily, memory loss, numbness, speech difficulty, weakness, tremors, confusion  ALLERGIES: Allergies  Allergen Reactions  . Lisinopril Swelling  . Statins     parkinsons   . Thorazine [Chlorpromazine]     Confusion/loopy    HOME MEDICATIONS: Outpatient Prescriptions Prior to Visit  Medication Sig Dispense Refill  . acetaminophen (TYLENOL) 500 MG tablet Take 500 mg by mouth every 4 (four) hours as needed.    Marland Kitchen albuterol (PROAIR HFA) 108 (90 BASE) MCG/ACT inhaler Inhale 2 puffs into the lungs every 4 (four) hours as needed. For wheezing 3  Inhaler 3  . ALPRAZolam (XANAX) 0.5 MG tablet Take 0.5 mg by mouth daily as needed for anxiety. As needed    . amLODipine (NORVASC) 5 MG tablet Take 1 tablet (5 mg total) by mouth daily. 90 tablet 3  . azithromycin (ZITHROMAX Z-PAK) 250 MG tablet Use as directed 6 tablet 1  . buPROPion (WELLBUTRIN XL) 150 MG 24 hr tablet Take 1 tablet (150 mg total) by mouth daily. 90 tablet 3  . carbidopa-levodopa (SINEMET IR) 25-100 MG per tablet Take 1.5 tablets by mouth 4 (four) times daily. At 10AM, 2PM, 6PM AND 10PM 540 tablet 1  . Cholecalciferol (VITAMIN D3) 2000 UNITS TABS Take 1 tablet by mouth daily.     . clonazePAM (KLONOPIN) 1 MG tablet Take 2 tablets (2 mg total) by mouth at bedtime. Can take 1/2 tablet more, if having difficulty sleeping. 60 tablet 0  . Coenzyme Q10 (CO Q 10 PO) Take 1 tablet by mouth at bedtime.    Marland Kitchen desloratadine (CLARINEX) 5 MG tablet Take 1 tablet (5 mg total) by mouth daily. [place]inger  tablet 3  . docusate sodium (COLACE) 100 MG capsule Take 300 mg by mouth at bedtime.     . donepezil (ARICEPT) 23 MG TABS tablet TAKE 1 TABLET EVERY MORNING 90 tablet 1  . ELIQUIS 5 MG TABS tablet TAKE 1 TABLET TWICE A DAY 180 tablet 3  . FLUoxetine (PROZAC) 40 MG capsule Take 1 capsule (40 mg total) by mouth every morning. 90 capsule 3  . fluticasone (VERAMYST) 27.5 MCG/SPRAY nasal spray Place 1 spray into the nose 2 (two) times daily. 10 g 1  . Fluticasone-Salmeterol (ADVAIR) 250-50 MCG/DOSE AEPB Inhale 1 puff into the lungs every 12 (twelve) hours. As needed    . guaiFENesin (ROBITUSSIN) 100 MG/5ML liquid Take 200 mg by mouth 4 (four) times daily as needed for cough.    Marland Kitchen HYDROcodone-homatropine (HYCODAN) 5-1.5 MG/5ML syrup Take 5 mLs by mouth every 6 (six) hours as needed for cough. 240 mL 0  . ibuprofen (ADVIL,MOTRIN) 200 MG tablet Take 400 mg by mouth every 6 (six) hours as needed for pain.    Marland Kitchen L-Methylfolate-B12-B6-B2 (CEREFOLIN) 04-08-49-5 MG TABS TAKE 1 TABLET DAILY 90 each 3  . levocetirizine  (XYZAL) 5 MG tablet TAKE 1 TABLET EVERY EVENING 90 tablet 3  . levocetirizine (XYZAL) 5 MG tablet Take 1 tablet (5 mg total) by mouth every evening. 90 tablet 3  . Multiple Vitamin (MULTIVITAMINS PO) Take 1 tablet by mouth at bedtime.     . Olopatadine HCl (PATADAY) 0.2 % SOLN Apply 1 drop to eye as needed (both eyes). 1 Bottle 2  . predniSONE (DELTASONE) 10 MG tablet 3 tabs by mouth per day for 3 days,2tabs per day for 3 days,1tab per day for 3 days 18 tablet 0  . primidone (MYSOLINE) 50 MG tablet TAKE 1 TABLET TWICE A DAY  AS DIRECTED 180 tablet 2  . solifenacin (VESICARE) 5 MG tablet Take 1 tablet (5 mg total) by mouth daily. 90 tablet 3   No facility-administered medications prior to visit.    PAST MEDICAL HISTORY: Past Medical History  Diagnosis Date  . ONYCHOMYCOSIS, TOENAILS 06/27/2008  . HYPERLIPIDEMIA 07/28/2007  . ANXIETY 09/28/2007  . OBSTRUCTIVE SLEEP APNEA 01/01/2008  . RESTLESS LEGS SYNDROME 12/26/2007  . HEARING LOSS 06/27/2008  . HYPERTENSION 07/28/2007  . MYOCARDIAL INFARCTION, HX OF 07/28/2007  . CORONARY ARTERY DISEASE 07/28/2007  . Atrial fibrillation 09/28/2007  . SINUSITIS- ACUTE-NOS 10/10/2007  . ALLERGIC RHINITIS 09/28/2007  . CHOLELITHIASIS 09/28/2007  . BENIGN PROSTATIC HYPERTROPHY 09/28/2007  . CELLULITIS, HAND, LEFT 09/24/2010  . SYNCOPE 07/28/2007  . INSOMNIA-SLEEP DISORDER-UNSPEC 04/03/2009  . HYPERSOMNIA WITH SLEEP APNEA UNSPECIFIED 12/26/2007  . FATIGUE 01/01/2008  . DYSPNEA 04/03/2009  . Wheezing 11/06/2009  . ANGIOEDEMA 11/06/2009  . COLON CANCER, HX OF 07/28/2007  . PROSTATE CANCER, HX OF 07/28/2007  . COLONIC POLYPS, HX OF 07/28/2007  . Long term current use of anticoagulant 12/22/2010  . Asthma   . DEMENTIA   . Spinal stenosis   . History of atrial fibrillation   . Lumbar stenosis 10/30/2011  . CAD (coronary artery disease)     Severe three-vessel coronary disease. LIMA to the LAD patent. SVG to PDA patent. SVG to OM 3 patent. Preserved ejection  fraction. 2008.  . Cancer   . Thyroid disease   . Arthritis   . Pneumonia   . 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 Sepsis with acute organ dysfunction 09/21/2011  . 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 Cervical disc disorder with radiculopathy of cervical region 04/15/2014  . 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 Multiple system atrophy, Parkinson variant 10/01/2014    PAST SURGICAL HISTORY: Past Surgical History  Procedure Laterality Date  . Prostatectomy  2001  . Cardioversion  2007  . Stress cardiolite  02/08/2007  . Schocardiograph  10/07/2006  . Right thyroid lobectomy    . Coronary artery bypass graft  1995    x 3  . Hernia repair  1998  . Colonoscopy  2010    last done 2010- clear  . Cardiac electrophysiology study and ablation    . Thyroidectomy, partial      FAMILY HISTORY: Family History  Problem Relation Age of Onset  . Coronary artery disease Father     before 47 yo  . Coronary artery disease Sister     before 60 yo  . Hypertension Other     SOCIAL HISTORY: History   Social History  . Marital Status: Married    Spouse Name: Diane  . Number of Children: 2  . Years of Education: 14   Occupational History  . retired Health visitor    Social History Main Topics  . Smoking status: Never Smoker   . Smokeless tobacco: Never Used  . Alcohol Use: No  . Drug Use: No  . Sexual Activity: Not on file   Other Topics Concern  . Not on file   Social History Narrative   Patient is married (Diane) and lives at home with his wife.   Patient has 7 children.   Patient is retired.   Patient has a Associates degree.   Patient is right-handed.   Patient does not drink any caffeine.      PHYSICAL EXAM  Filed Vitals:   01/29/15 1528  BP: 113/63  Pulse: 74  Height: 5\' 9"  (1.753 m)  Weight: 230 lb (104.327 kg)   Body mass index is 33.95  kg/(m^2).  Generalized: Well developed, in no acute distress   Neurological examination  Mentation: Alert oriented to time, place, history taking. Follows all commands speech and language fluent Cranial nerve II-XII: Pupils were equal round reactive to light. Extraocular movements were full, visual field were full on confrontational test. Facial sensation and strength were normal. Uvula tongue midline. Head turning and shoulder shrug  were normal and symmetric. Motor: The motor testing reveals 5 over 5 strength of all 4 extremities. Good symmetric motor tone is noted throughout.  Sensory: Sensory testing is intact to soft touch on all 4 extremities. No evidence of extinction is noted.  Coordination: Cerebellar testing reveals good finger-nose-finger but difficulty with  heel-to-shin bilaterally.  Gait and station: wheelchair bound. Able to stand with two assist but unable to take a step.  Reflexes: Deep tendon reflexes are symmetric and normal bilaterally.    DIAGNOSTIC DATA (LABS, IMAGING, TESTING) - I reviewed patient records, labs, notes, testing and imaging myself where available.  Lab Results  Component Value Date   WBC 6.3 02/27/2014   HGB 15.5 02/27/2014   HCT 44.6 02/27/2014   MCV 95.4 02/27/2014   PLT 207.0 02/27/2014      Component Value Date/Time   NA 140 02/27/2014 1547   K 4.0 02/27/2014 1547   CL 102 02/27/2014 1547   CO2 31 02/27/2014 1547   GLUCOSE 87 02/27/2014 1547   BUN 10 02/27/2014 1547   CREATININE 0.7 02/27/2014 1547   CALCIUM 9.4 02/27/2014 1547  PROT 6.3 02/27/2014 1547   ALBUMIN 3.8 02/27/2014 1547   AST 27 02/27/2014 1547   ALT 9 02/27/2014 1547   ALKPHOS 73 02/27/2014 1547   BILITOT 1.3* 02/27/2014 1547   GFRNONAA 87* 05/15/2013 1433   GFRAA >90 05/15/2013 1433   Lab Results  Component Value Date   CHOL 180 02/27/2014   HDL 34.70* 02/27/2014   LDLCALC 48 02/27/2014   LDLDIRECT 76.5 08/29/2013   TRIG 487.0* 02/27/2014   CHOLHDL 5  02/27/2014   Lab Results  Component Value Date   HGBA1C 5.3 03/04/2010   Lab Results  Component Value Date   VITAMINB12 1380* 03/04/2010   Lab Results  Component Value Date   TSH 2.57 02/27/2014      ASSESSMENT AND PLAN 80 y.o. year old male  has a past medical history of ONYCHOMYCOSIS, TOENAILS (06/27/2008); HYPERLIPIDEMIA (07/28/2007); ANXIETY (09/28/2007); OBSTRUCTIVE SLEEP APNEA (01/01/2008); RESTLESS LEGS SYNDROME (12/26/2007); HEARING LOSS (06/27/2008); HYPERTENSION (07/28/2007); MYOCARDIAL INFARCTION, HX OF (07/28/2007); CORONARY ARTERY DISEASE (07/28/2007); Atrial fibrillation (09/28/2007); SINUSITIS- ACUTE-NOS (10/10/2007); ALLERGIC RHINITIS (09/28/2007); CHOLELITHIASIS (09/28/2007); BENIGN PROSTATIC HYPERTROPHY (09/28/2007); CELLULITIS, HAND, LEFT (09/24/2010); SYNCOPE (07/28/2007); INSOMNIA-SLEEP DISORDER-UNSPEC (04/03/2009); HYPERSOMNIA WITH SLEEP APNEA UNSPECIFIED (12/26/2007); FATIGUE (01/01/2008); DYSPNEA (04/03/2009); Wheezing (11/06/2009); ANGIOEDEMA (11/06/2009); COLON CANCER, HX OF (07/28/2007); PROSTATE CANCER, HX OF (07/28/2007); COLONIC POLYPS, HX OF (07/28/2007); Long term current use of anticoagulant (12/22/2010); Asthma; DEMENTIA; Spinal stenosis; History of atrial fibrillation; Lumbar stenosis (10/30/2011); CAD (coronary artery disease); Cancer; Thyroid disease; Arthritis; Pneumonia; Atypical Parkinsonism (08/25/2012); Sepsis with acute organ dysfunction (09/21/2011); Parkinson's variant of multiple system atrophy (02/27/2014); Cervical disc disorder with radiculopathy of cervical region (04/15/2014); Multiple system atrophy with bradykinesia (02/28/2014); and Multiple system atrophy, Parkinson variant (10/01/2014). here with:  1. Parkinson's/ MSA 2. REM sleep disorder  The patient is now unable to ambulate. He is confined to a wheelchair. He has to have assistance with transfers. The patient has noted benefit with Sinemet. He will continue this. Klonopin has also been beneficial for his  sleep. He will continue this as well. The patient is having some trouble with swallowing thin liquids. I will order a modified barium swallow. The wife had questions about his prognosis and what to expect in the future. I spoke in depth with the wife. Also recommended that she look into support groups for herself. She verbalized understanding. If the patient's symptoms worsen or he develops new symptoms she she'll let us know. Otherwise he will follow-up in 3-4 months.   [name], MSN, NP-C 01/29/2015, 3:30 PM [facility] [address], [place], [place] [address] [phone]  Note: This document was prepared with digital dictation and possible smart phrase technology. Any transcriptional errors that result from this process are unintentional.

## 2015-01-29 NOTE — Progress Notes (Signed)
I agree with the assessment and plan as directed by NP .The patient is known to me .   Jahred Tatar, MD  

## 2015-01-31 ENCOUNTER — Other Ambulatory Visit (HOSPITAL_COMMUNITY): Payer: Self-pay | Admitting: Adult Health

## 2015-01-31 DIAGNOSIS — R1310 Dysphagia, unspecified: Secondary | ICD-10-CM

## 2015-02-05 ENCOUNTER — Ambulatory Visit (HOSPITAL_COMMUNITY): Payer: BLUE CROSS/BLUE SHIELD

## 2015-02-18 ENCOUNTER — Ambulatory Visit (HOSPITAL_COMMUNITY)
Admission: RE | Admit: 2015-02-18 | Discharge: 2015-02-18 | Disposition: A | Discharge status: Home / Self Care | Payer: Medicare Other | Source: Ambulatory Visit | Attending: Adult Health | Admitting: Adult Health

## 2015-02-18 DIAGNOSIS — I2510 Atherosclerotic heart disease of native coronary artery without angina pectoris: Secondary | ICD-10-CM | POA: Diagnosis not present

## 2015-02-18 DIAGNOSIS — R1310 Dysphagia, unspecified: Secondary | ICD-10-CM | POA: Diagnosis not present

## 2015-02-18 DIAGNOSIS — G4733 Obstructive sleep apnea (adult) (pediatric): Secondary | ICD-10-CM | POA: Diagnosis not present

## 2015-02-18 DIAGNOSIS — Z8546 Personal history of malignant neoplasm of prostate: Secondary | ICD-10-CM | POA: Diagnosis not present

## 2015-02-18 DIAGNOSIS — Z85038 Personal history of other malignant neoplasm of large intestine: Secondary | ICD-10-CM | POA: Insufficient documentation

## 2015-02-18 DIAGNOSIS — G20 Parkinson's disease: Secondary | ICD-10-CM | POA: Diagnosis not present

## 2015-02-18 DIAGNOSIS — R05 Cough: Secondary | ICD-10-CM | POA: Diagnosis not present

## 2015-02-18 NOTE — Procedures (Signed)
Objective Swallowing Evaluation: Modified Barium Swallowing Study  Patient Details  Name: [name] [name] MRN: [id number] Date of Birth: 05-20-1935  Today's Date: 02/18/2015 Time:  -     Past Medical History:  Past Medical History  Diagnosis Date  . ONYCHOMYCOSIS, TOENAILS 06/27/2008  . HYPERLIPIDEMIA 07/28/2007  . ANXIETY 09/28/2007  . OBSTRUCTIVE SLEEP APNEA 01/01/2008  . RESTLESS LEGS SYNDROME 12/26/2007  . HEARING LOSS 06/27/2008  . HYPERTENSION 07/28/2007  . MYOCARDIAL INFARCTION, HX OF 07/28/2007  . CORONARY ARTERY DISEASE 07/28/2007  . Atrial fibrillation 09/28/2007  . SINUSITIS- ACUTE-NOS 10/10/2007  . ALLERGIC RHINITIS 09/28/2007  . CHOLELITHIASIS 09/28/2007  . BENIGN PROSTATIC HYPERTROPHY 09/28/2007  . CELLULITIS, HAND, LEFT 09/24/2010  . SYNCOPE 07/28/2007  . INSOMNIA-SLEEP DISORDER-UNSPEC 04/03/2009  . HYPERSOMNIA WITH SLEEP APNEA UNSPECIFIED 12/26/2007  . FATIGUE 01/01/2008  . DYSPNEA 04/03/2009  . Wheezing 11/06/2009  . ANGIOEDEMA 11/06/2009  . COLON CANCER, HX OF 07/28/2007  . PROSTATE CANCER, HX OF 07/28/2007  . COLONIC POLYPS, HX OF 07/28/2007  . Long term current use of anticoagulant 12/22/2010  . Asthma   . DEMENTIA   . Spinal stenosis   . History of atrial fibrillation   . Lumbar stenosis 10/30/2011  . CAD (coronary artery disease)     Severe three-vessel coronary disease. LIMA to the LAD patent. SVG to PDA patent. SVG to OM 3 patent. Preserved ejection fraction. 2008.  . Cancer   . Thyroid disease   . Arthritis   . Pneumonia   . 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 Sepsis with acute organ dysfunction 09/21/2011  . 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 Cervical disc disorder with radiculopathy of cervical region 04/15/2014  . 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 Multiple system atrophy, Parkinson variant 10/01/2014   Past Surgical History:  Past Surgical History   Procedure Laterality Date  . Prostatectomy  2001  . Cardioversion  2007  . Stress cardiolite  02/08/2007  . Schocardiograph  10/07/2006  . Right thyroid lobectomy    . Coronary artery bypass graft  1995    x 3  . Hernia repair  1998  . Colonoscopy  2010    last done 2010- clear  . Cardiac electrophysiology study and ablation    . Thyroidectomy, partial     HPI:  80 yo male referred by neurology for MBS due to concerns pt may be aspirating.  PMH + for MSA *diagnosed in April 2016, Parkinsons *diagnosed 1 1/2 years ago, RLS, gait instability, chronic condom cath use, diarrhea, recurrent falls *most notably in Dec requiring lift usage, OSA on CPAP, RBD.  Pt has recently been treated for pna - wife reports frequent pnas over the last year.  She also states pt has allergies that frequently turn into bronchitis.  Wife Diane reports pt required modified heimlich manuever after choking on a pea.  Pt reports more frequent coughing on liquids than foods.  He has not lost weight per family and caregiver Broadus Sylvanus present.       Recommendation/Prognosis  Clinical Impression:   Dysphagia Diagnosis: Mild oral phase dysphagia;Mild pharyngeal phase dysphagia Clinical impression: Mild oropharyngeal dysphagia without witness aspiration of any consistency tested.  Barium tablet given with liquids lodged at vallecular space resulting in reflexive cough, pudding provided to aid transit into esophagus.  Decreased oral propulsion strength source of vallecular retention of tablet.  Questionable trace aspiration of liquid when taking  tablet.  Pt is also edentulous which likely exacerbates his oral deficits.  Minimal oral residuals of liquids spilled into pharynx x1 with delayed swallow.  Pharyngeal swallow overall was timely and strong.  Pt noted to clear his throat prior to po administration.  Using teach back, educated pt, Diane and caregiver Broadus Markos to findings and recommendations.  Advised pt to take medicine with  applesauce or pudding  and avoid particulate foods.  Further advised to consume liquids throughout meal to aid oropharyngeal and esophageal clearance. Suspect pt may occasionally aspirate given reported symptoms and findings and educated to mitigation strategies.    Swallow Evaluation Recommendations:  Diet Recommendations: Dysphagia 3 (Mechanical Soft);Thin liquid Liquid Administration via: Cup;Straw Medication Administration: Whole meds with puree (start and follow with liquids) Supervision: Patient able to self feed Compensations: Slow rate;Small sips/bites Postural Changes and/or Swallow Maneuvers: Seated upright 90 degrees;Upright 30-60 min after meal Oral Care Recommendations: Oral care BID       Individuals Consulted: Consulted and Agree with Results and Recommendations: Patient;Family member/caregiver Family Member Consulted: spouse Diane, caregiver Broadus Frances      SLP Assessment/Plan  General: Date of Onset: 02/18/15 Type of Study: Modified Barium Swallowing Study Reason for Referral: Objectively evaluate swallowing function Diet Prior to this Study: Regular;Thin liquids Temperature Spikes Noted: No Respiratory Status: Room air Behavior/Cognition: Alert;Cooperative;Pleasant mood;Other (comment) (delayed responses likely due to cognition) Oral Cavity - Dentition: Edentulous (spouse reports pt's dentures fit but they "don't chew") Oral Motor / Sensory Function:  (weakness, discoordination, decreased velar elevation, throat clear at baseline) Self-Feeding Abilities: Able to feed self Patient Positioning: Upright in chair Baseline Vocal Quality: Low vocal intensity Volitional Cough: Weak Volitional Swallow: Able to elicit Anatomy: Within functional limits   Reason for Referral:   Objectively evaluate swallowing function    Oral Phase: Oral Preparation/Oral Phase Oral Phase: Impaired Oral - Nectar Oral - Nectar Teaspoon: Within functional limits Oral - Nectar Cup: Within  functional limits Oral - Thin Oral - Thin Teaspoon: Within functional limits Oral - Thin Cup: Within functional limits Oral - Thin Straw: Within functional limits Oral - Solids Oral - Puree: Within functional limits Oral - Mechanical Soft: Delayed oral transit;Impaired mastication;Weak lingual manipulation Oral - Pill: Weak lingual manipulation;Delayed oral transit;Reduced posterior propulsion (pt required extra bolus of liquid to aid oral transiting into pharynx) Oral Phase - Comment Oral Phase - Comment: trace oral tongue base residuals noted that clear with reflexive swallows   Pharyngeal Phase:  Pharyngeal Phase Pharyngeal Phase: Impaired Pharyngeal - Thin Pharyngeal - Thin Cup: Reduced tongue base retraction (minimal vallecular stasis after swallow as oral residuals spill into pharynx) Pharyngeal - Solids Pharyngeal - Puree: Within functional limits Pharyngeal - Mechanical Soft: Within functional limits Pharyngeal - Pill: Premature spillage to valleculae;Reduced tongue base retraction (barium tablet lodged at vallecular space with pt elicited cough, pudding provided to aid transit into esophagus)   Cervical Esophageal Phase  Cervical Esophageal Phase Cervical Esophageal Phase:  (appearance of mild decreased clearance distally without pt sensation- pt denies sensation of backflow or GERD at home)   GN  Functional Assessment Tool Used: mbs, clinical judgement Functional Limitations: Swallowing Swallow Current Status (T5573): At least 1 percent but less than 20 percent impaired, limited or restricted Swallow Goal Status [phone]): At least 1 percent but less than 20 percent impaired, limited or restricted Swallow Discharge Status [phone]): At least 1 percent but less than 20 percent impaired, limited or restricted       Luanna Salk, [facility]  SLP [phone]

## 2015-02-19 ENCOUNTER — Telehealth: Payer: Self-pay | Admitting: Adult Health

## 2015-02-19 DIAGNOSIS — G232 Striatonigral degeneration: Secondary | ICD-10-CM

## 2015-02-19 NOTE — Telephone Encounter (Signed)
I called the patient. I reviewed the results of his swallow evaluation. The speech therapist has also reviewed this with them. Wife states they were already doing this diet for the most part. Wife is also asking about a hospital bed. She feels this would be beneficial for the patient to be able to set up in the bed to help with his breathing as well as when he eats. He also feels that being able to adjust the feet will benefit his restless leg symptoms. I will fax over an order for a hospital bed. Patient verbalized understanding.

## 2015-02-20 ENCOUNTER — Other Ambulatory Visit: Payer: Self-pay | Admitting: Adult Health

## 2015-02-20 DIAGNOSIS — G232 Striatonigral degeneration: Secondary | ICD-10-CM

## 2015-02-23 ENCOUNTER — Other Ambulatory Visit: Payer: Self-pay | Admitting: Neurology

## 2015-02-24 ENCOUNTER — Other Ambulatory Visit: Payer: Self-pay

## 2015-02-24 ENCOUNTER — Other Ambulatory Visit: Payer: Self-pay | Admitting: Neurology

## 2015-02-24 ENCOUNTER — Telehealth: Payer: Self-pay | Admitting: Neurology

## 2015-02-24 DIAGNOSIS — G232 Striatonigral degeneration: Secondary | ICD-10-CM

## 2015-02-24 DIAGNOSIS — F0390 Unspecified dementia without behavioral disturbance: Secondary | ICD-10-CM

## 2015-02-24 DIAGNOSIS — G4752 REM sleep behavior disorder: Secondary | ICD-10-CM

## 2015-02-24 MED ORDER — PRIMIDONE 50 MG PO TABS: ORAL_TABLET | ORAL | Status: DC

## 2015-02-24 NOTE — Telephone Encounter (Signed)
Ok to place order for DME for mattress topper as requested.

## 2015-02-24 NOTE — Telephone Encounter (Signed)
Patient's wife is calling as she needs a prescription for a bed topper called to [facility], either gel or air as needed. Also, wife is calling in regard to Rx clonazepam 1 mg and states she needs a 90 day supply sent to [facility]. Patient also needs a 90 day supply of primidone 50 mg sent to [facility] mail order(180 tablets).  Please call.

## 2015-02-24 NOTE — Telephone Encounter (Signed)
Patient is requesting 90 day Rx because [place].  Dr Dohmeier is out of the office, forwarded to [facility] for review.

## 2015-02-24 NOTE — Telephone Encounter (Signed)
Dr. Rexene Alberts can you ok DME, Air topper or gel topper for bed from [facility].

## 2015-02-24 NOTE — Telephone Encounter (Signed)
Primidone was sent to [facility] in Jan, however it has now been re-sent.  Spoke with Ms Shauna Hugh who said they are requesting a 90 day Rx for Clonazepam because [place] will be going [place] the [place].  Request sent to [facility] for review since Dr Brett Fairy and Jinny Blossom are both out of the office.  They are aware Primidone has been sent.  Leaving note open for first portion of message regarding bed topper to [facility].

## 2015-02-25 ENCOUNTER — Other Ambulatory Visit: Payer: Self-pay | Admitting: Neurology

## 2015-02-25 ENCOUNTER — Telehealth: Payer: Self-pay

## 2015-02-25 ENCOUNTER — Other Ambulatory Visit: Payer: Self-pay | Admitting: *Deleted

## 2015-02-25 MED ORDER — CLONAZEPAM 1 MG PO TABS: 2.0000 mg | ORAL_TABLET | Freq: Every day | ORAL | Status: DC

## 2015-02-25 MED ORDER — ALPRAZOLAM 0.5 MG PO TABS: 0.5000 mg | ORAL_TABLET | Freq: Every day | ORAL | Status: DC | PRN

## 2015-02-25 NOTE — Telephone Encounter (Signed)
Rx for clonazepam renewed on behalf of Dr. Keturah Barre.

## 2015-02-25 NOTE — Telephone Encounter (Signed)
Done hardcopy to Cherina  

## 2015-02-25 NOTE — Telephone Encounter (Signed)
Rx sent to pharmacy   

## 2015-02-25 NOTE — Telephone Encounter (Signed)
Duplicate request

## 2015-02-25 NOTE — Telephone Encounter (Signed)
Order placed

## 2015-02-26 ENCOUNTER — Other Ambulatory Visit: Payer: Self-pay | Admitting: *Deleted

## 2015-02-26 ENCOUNTER — Telehealth: Payer: Self-pay

## 2015-02-26 NOTE — Telephone Encounter (Signed)
Spoke to wife Diane to notify her that the request for an air mattress topper for [name] [name] will not be covered by insurance until he sees Dr. Brett Fairy and she determines that this is a medical need. We were able to schedule patient for 03/18/15. Diane is aware to arrive 70min early.

## 2015-03-03 ENCOUNTER — Ambulatory Visit: Payer: Medicare Other | Admitting: Neurology

## 2015-03-04 NOTE — Telephone Encounter (Signed)
Note opened in error.

## 2015-03-07 ENCOUNTER — Telehealth: Payer: Self-pay | Admitting: Neurology

## 2015-03-07 MED ORDER — CARBIDOPA-LEVODOPA 25-100 MG PO TABS: ORAL_TABLET | ORAL | Status: DC

## 2015-03-07 NOTE — Telephone Encounter (Signed)
I called patient. The patient indicates that the Sinemet tablets were canceled somehow through [facility]. They are going to run out of the medication, I will call in a two-week supply, reorder the medication through [place] [facility].

## 2015-03-12 ENCOUNTER — Other Ambulatory Visit: Payer: Self-pay

## 2015-03-12 ENCOUNTER — Telehealth: Payer: Self-pay | Admitting: Neurology

## 2015-03-12 DIAGNOSIS — G239 Degenerative disease of basal ganglia, unspecified: Principal | ICD-10-CM

## 2015-03-12 DIAGNOSIS — G903 Multi-system degeneration of the autonomic nervous system: Secondary | ICD-10-CM

## 2015-03-12 NOTE — Telephone Encounter (Signed)
Called Adrienne back and submitted order again with NPI number for gel topper with "for APP&P". Relayed order to [employer].

## 2015-03-12 NOTE — Telephone Encounter (Signed)
Adrienne from [facility] stating that she faxed over an order form a while ago and has not received it yet.  She would like a new order with a doctor's NPI #. Please call and advice # [phone] Fax # [phone]

## 2015-03-17 ENCOUNTER — Telehealth: Payer: Self-pay | Admitting: Neurology

## 2015-03-17 ENCOUNTER — Telehealth: Payer: Self-pay

## 2015-03-17 ENCOUNTER — Other Ambulatory Visit: Payer: Self-pay | Admitting: Neurology

## 2015-03-17 DIAGNOSIS — Z9989 Dependence on other enabling machines and devices: Principal | ICD-10-CM

## 2015-03-17 DIAGNOSIS — G4733 Obstructive sleep apnea (adult) (pediatric): Secondary | ICD-10-CM

## 2015-03-17 DIAGNOSIS — G232 Striatonigral degeneration: Secondary | ICD-10-CM

## 2015-03-17 DIAGNOSIS — G4752 REM sleep behavior disorder: Secondary | ICD-10-CM

## 2015-03-17 NOTE — Telephone Encounter (Signed)
-----   Message from Lester Toast, RN sent at 03/17/2015  3:41 PM EDT ----- This pt is saying that they took the last klonopin last night and need a refill and they were only given a 30 day supply. Can you check on this?  Thanks, [employer]

## 2015-03-17 NOTE — Telephone Encounter (Signed)
Diane, pt's wife called stating that she found the clonazePAM (KLONOPIN) 1 MG tablet. She wanted to inform Kristen. FYI.

## 2015-03-17 NOTE — Telephone Encounter (Signed)
I called the pharmacy.  Spoke with Barnabas Lister.  He reviewed the patient's file and verified they dispensed #225 Klonopin (90 day supply) on 04/19,patient picked up Rx on 04/20.  Says since this is a control, they double count the drug before it is given to the patient, so they are certain 90 day supply was given.  I called the patient back.  Ms Heggie says they do not remember picking up this med, and cannot find it anywhere.  She is requesting a new Rx and early [facility]ill auth [facility]  She is aware the ins likely will not pay since it was just filled last month.  Okay to fill early?  Please advise.  Thank you.

## 2015-03-17 NOTE — Telephone Encounter (Signed)
Duplicate Request.

## 2015-03-17 NOTE — Telephone Encounter (Signed)
Diane, pt's wife called regarding the gel air topper for bed being delivered today but patient has an appointment tomorrow 03/18/15.  Diane is confused, she thought maybe the patient needed to be seen first by Dr. Brett Fairy. Please call and advice # [phone]

## 2015-03-18 ENCOUNTER — Ambulatory Visit: Payer: Self-pay | Admitting: Neurology

## 2015-03-20 NOTE — Telephone Encounter (Signed)
This is related to dementia. From now on someone needs to put the meds in a pillbox for the patient. Only 30 day supplies.

## 2015-03-21 NOTE — Telephone Encounter (Signed)
I spoke with [name] [name] who said they have found the medication.  She apologized for the confusion.  As well, she inquired about the Sinemet.  Says they have not gotten it in the mail yet.  She asked that I call [facility].  I called [facility], spoke with [name] [name].  She verified the Rx was processed and they shipped the meds on 05/10.  I called [name] [name] back to advise.  Got no answer.  Left message.

## 2015-03-22 ENCOUNTER — Other Ambulatory Visit: Payer: Self-pay | Admitting: Internal Medicine

## 2015-03-31 ENCOUNTER — Other Ambulatory Visit: Payer: Self-pay | Admitting: Neurology

## 2015-04-17 ENCOUNTER — Telehealth: Payer: Self-pay | Admitting: Internal Medicine

## 2015-04-17 NOTE — Telephone Encounter (Signed)
I went ahead and schedule him for Tues morning, but they may go to Urgent Care before then. Just wanted to let you know

## 2015-04-17 NOTE — Telephone Encounter (Signed)
Pt wife called in and wanted to know if she can drop a urine sample off at like.  It is clouding and strong smelling    Best number [phone]

## 2015-04-17 NOTE — Telephone Encounter (Signed)
Patient's wife calling back regarding note below

## 2015-04-17 NOTE — Telephone Encounter (Signed)
unfort the best thing is for UA and culture done at the office, as different bacteria can cause infection, and urine sample from home would likely be contaminated  Please consider OV, as we normally dont rx antibx by phone without OV

## 2015-04-18 ENCOUNTER — Encounter: Payer: Self-pay | Admitting: Internal Medicine

## 2015-04-22 ENCOUNTER — Ambulatory Visit (INDEPENDENT_AMBULATORY_CARE_PROVIDER_SITE_OTHER): Payer: Medicare Other | Admitting: Internal Medicine

## 2015-04-22 ENCOUNTER — Other Ambulatory Visit (INDEPENDENT_AMBULATORY_CARE_PROVIDER_SITE_OTHER): Payer: Medicare Other

## 2015-04-22 ENCOUNTER — Encounter: Payer: Self-pay | Admitting: Internal Medicine

## 2015-04-22 VITALS — BP 120/68 | HR 78 | Temp 97.8°F

## 2015-04-22 DIAGNOSIS — R310 Gross hematuria: Secondary | ICD-10-CM | POA: Diagnosis not present

## 2015-04-22 DIAGNOSIS — R21 Rash and other nonspecific skin eruption: Secondary | ICD-10-CM

## 2015-04-22 DIAGNOSIS — I10 Essential (primary) hypertension: Secondary | ICD-10-CM

## 2015-04-22 LAB — URINALYSIS, ROUTINE W REFLEX MICROSCOPIC
BILIRUBIN URINE: NEGATIVE
Hgb urine dipstick: NEGATIVE
KETONES UR: NEGATIVE
Nitrite: NEGATIVE
SPECIFIC GRAVITY, URINE: 1.025 (ref 1.000–1.030)
Total Protein, Urine: NEGATIVE
URINE GLUCOSE: NEGATIVE
UROBILINOGEN UA: 0.2 (ref 0.0–1.0)
pH: 6 (ref 5.0–8.0)

## 2015-04-22 LAB — CBC WITH DIFFERENTIAL/PLATELET
BASOS PCT: 1 % (ref 0.0–3.0)
Basophils Absolute: 0.1 10*3/uL (ref 0.0–0.1)
EOS ABS: 0.3 10*3/uL (ref 0.0–0.7)
Eosinophils Relative: 3.4 % (ref 0.0–5.0)
HCT: 44.9 % (ref 39.0–52.0)
Hemoglobin: 15.2 g/dL (ref 13.0–17.0)
LYMPHS PCT: 14.4 % (ref 12.0–46.0)
Lymphs Abs: 1.2 10*3/uL (ref 0.7–4.0)
MCHC: 33.9 g/dL (ref 30.0–36.0)
MCV: 93.4 fl (ref 78.0–100.0)
MONO ABS: 0.8 10*3/uL (ref 0.1–1.0)
Monocytes Relative: 10.3 % (ref 3.0–12.0)
NEUTROS PCT: 70.9 % (ref 43.0–77.0)
Neutro Abs: 5.7 10*3/uL (ref 1.4–7.7)
PLATELETS: 243 10*3/uL (ref 150.0–400.0)
RBC: 4.8 Mil/uL (ref 4.22–5.81)
RDW: 14.8 % (ref 11.5–15.5)
WBC: 8.1 10*3/uL (ref 4.0–10.5)

## 2015-04-22 LAB — BASIC METABOLIC PANEL
BUN: 12 mg/dL (ref 6–23)
CO2: 31 meq/L (ref 19–32)
Calcium: 8.9 mg/dL (ref 8.4–10.5)
Chloride: 103 mEq/L (ref 96–112)
Creatinine, Ser: 0.67 mg/dL (ref 0.40–1.50)
GFR: 121.85 mL/min (ref >60.00)
GLUCOSE: 70 mg/dL (ref 70–99)
POTASSIUM: 3.8 meq/L (ref 3.5–5.1)
Sodium: 136 mEq/L (ref 135–145)

## 2015-04-22 LAB — HEPATIC FUNCTION PANEL
ALT: 3 U/L (ref 0–53)
AST: 18 U/L (ref 0–37)
Albumin: 3.7 g/dL (ref 3.5–5.2)
Alkaline Phosphatase: 66 U/L (ref 39–117)
BILIRUBIN DIRECT: 0.1 mg/dL (ref 0.0–0.3)
BILIRUBIN TOTAL: 0.8 mg/dL (ref 0.2–1.2)
Total Protein: 6.1 g/dL (ref 6.0–8.3)

## 2015-04-22 MED ORDER — TRIAMCINOLONE ACETONIDE 0.1 % EX CREA: 1.0000 "application " | TOPICAL_CREAM | Freq: Two times a day (BID) | CUTANEOUS | Status: AC

## 2015-04-22 MED ORDER — CEPHALEXIN 500 MG PO CAPS: 500.0000 mg | ORAL_CAPSULE | Freq: Four times a day (QID) | ORAL | Status: DC

## 2015-04-22 NOTE — Progress Notes (Signed)
Pre visit review using our clinic review tool, if applicable. No additional management support is needed unless otherwise documented below in the visit note. 

## 2015-04-22 NOTE — Patient Instructions (Signed)
Please take all new medication as prescribed - the antibiotic and cream  You will be contacted regarding the referral for: CT scan  Please call if you change your mind about referral to Urology  Please continue all other medications as before, and refills have been done if requested.  Please have the pharmacy call with any other refills you may need.  Please keep your appointments with your specialists as you may have planned

## 2015-04-22 NOTE — Assessment & Plan Note (Addendum)
Though little evidence for UTI, wilt tx empiric cephalexin, for UA and cx, also ct abd /pelvis no contrast - r/o stone, malignancy, declines urology referral for now  Note:  Total time for pt hx, exam, review of record with pt in the room, determination of diagnoses and plan for further eval and tx is > 40 min, with over 50% spent in coordination and counseling of patient

## 2015-04-22 NOTE — Progress Notes (Signed)
Subjective:    Patient ID: [name] [name], male    DOB: [birth year], 80 y.o.   MRN: [id number]  HPI  Here to f/u, had several episodes gross hematuria last wk, last episode 2 days ago, on chronic eliquis, without other fever, dysuria or other pain, and family and pt denies urinary symptoms such as dysuria, frequency, urgency, flank pain, or n/v, fever, chills.  No prior hx.  No hx of stones or malignancy.  Pt denies chest pain, increased sob or doe, wheezing, orthopnea, PND, increased LE swelling, palpitations, dizziness or syncope.  Dementia overall stable symptomatically.  Does have a new rash with several itchy areas to the back not better with otc lotrimin.  Is wheelchair bound, very difficult to transport, wife would like everything done now if possible, declines urology referral as this is so difficult.   Has hx of remote prostate ca 2002  Pt denies chest pain, increased sob or doe, wheezing, orthopnea, PND, increased LE swelling, palpitations, dizziness or syncope.  Pt denies new neurological symptoms such as new headache, or facial or extremity weakness or numbness Past Medical History  Diagnosis Date  . ONYCHOMYCOSIS, TOENAILS 06/27/2008  . HYPERLIPIDEMIA 07/28/2007  . ANXIETY 09/28/2007  . OBSTRUCTIVE SLEEP APNEA 01/01/2008  . RESTLESS LEGS SYNDROME 12/26/2007  . HEARING LOSS 06/27/2008  . HYPERTENSION 07/28/2007  . MYOCARDIAL INFARCTION, HX OF 07/28/2007  . CORONARY ARTERY DISEASE 07/28/2007  . Atrial fibrillation 09/28/2007  . SINUSITIS- ACUTE-NOS 10/10/2007  . ALLERGIC RHINITIS 09/28/2007  . CHOLELITHIASIS 09/28/2007  . BENIGN PROSTATIC HYPERTROPHY 09/28/2007  . CELLULITIS, HAND, LEFT 09/24/2010  . SYNCOPE 07/28/2007  . INSOMNIA-SLEEP DISORDER-UNSPEC 04/03/2009  . HYPERSOMNIA WITH SLEEP APNEA UNSPECIFIED 12/26/2007  . FATIGUE 01/01/2008  . DYSPNEA 04/03/2009  . Wheezing 11/06/2009  . ANGIOEDEMA 11/06/2009  . COLON CANCER, HX OF 07/28/2007  . PROSTATE CANCER, HX OF 07/28/2007  . COLONIC  POLYPS, HX OF 07/28/2007  . Long term current use of anticoagulant 12/22/2010  . Asthma   . DEMENTIA   . Spinal stenosis   . History of atrial fibrillation   . Lumbar stenosis 10/30/2011  . CAD (coronary artery disease)     Severe three-vessel coronary disease. LIMA to the LAD patent. SVG to PDA patent. SVG to OM 3 patent. Preserved ejection fraction. 2008.  . Cancer   . Thyroid disease   . Arthritis   . Pneumonia   . 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 Sepsis with acute organ dysfunction 09/21/2011  . 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 Cervical disc disorder with radiculopathy of cervical region 04/15/2014  . 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 Multiple system atrophy, Parkinson variant 10/01/2014   Past Surgical History  Procedure Laterality Date  . Prostatectomy  2001  . Cardioversion  2007  . Stress cardiolite  02/08/2007  . Schocardiograph  10/07/2006  . Right thyroid lobectomy    . Coronary artery bypass graft  1995    x 3  . Hernia repair  1998  . Colonoscopy  2010    last done 2010- clear  . Cardiac electrophysiology study and ablation    . Thyroidectomy, partial      reports that he has never smoked. He has never used smokeless tobacco. He reports that he does not drink alcohol or use illicit drugs. family history includes Coronary artery disease in his father and sister; Hypertension in his other. Allergies  Allergen Reactions  . Lisinopril Swelling  . Statins     parkinsons   . Thorazine [Chlorpromazine]     Confusion/loopy   Current Outpatient Prescriptions on File Prior to Visit  Medication Sig Dispense Refill  . acetaminophen (TYLENOL) 500 MG tablet Take 500 mg by mouth every 4 (four) hours as needed.    Marland Kitchen albuterol (PROAIR HFA) 108 (90 BASE) MCG/ACT inhaler Inhale 2 puffs into the lungs every 4 (four) hours as needed. For wheezing 3 Inhaler 3  . ALPRAZolam  (XANAX) 0.5 MG tablet Take 1 tablet (0.5 mg total) by mouth daily as needed for anxiety. As needed 30 tablet 5  . amLODipine (NORVASC) 5 MG tablet Take 1 tablet (5 mg total) by mouth daily. 90 tablet 3  . buPROPion (WELLBUTRIN XL) 150 MG 24 hr tablet Take 1 tablet (150 mg total) by mouth daily. 90 tablet 3  . carbidopa-levodopa (SINEMET IR) 25-100 MG per tablet TAKE 1 AND 1/2 TABLETS 4 TIMES A DAY AT 10AM, 2PM, 6PM AND 10PM 90 tablet 1  . Cholecalciferol (VITAMIN D3) 2000 UNITS TABS Take 1 tablet by mouth daily.     . clonazePAM (KLONOPIN) 1 MG tablet Take 2 tablets (2 mg total) by mouth at bedtime. Can take 1/2 tablet more, if having difficulty sleeping. 225 tablet 0  . Coenzyme Q10 (CO Q 10 PO) Take 1 tablet by mouth at bedtime.    Marland Kitchen desloratadine (CLARINEX) 5 MG tablet Take 1 tablet (5 mg total) by mouth daily. 90 tablet 3  . docusate sodium (COLACE) 100 MG capsule Take 300 mg by mouth at bedtime.     . donepezil (ARICEPT) 23 MG TABS tablet TAKE 1 TABLET EVERY MORNING 90 tablet 1  . ELIQUIS 5 MG TABS tablet TAKE 1 TABLET TWICE A DAY 180 tablet 3  . FLUoxetine (PROZAC) 40 MG capsule Take 1 capsule (40 mg total) by mouth every morning. 90 capsule 3  . fluticasone (VERAMYST) 27.5 MCG/SPRAY nasal spray Place 1 spray into the nose 2 (two) times daily. 10 g 1  . Fluticasone-Salmeterol (ADVAIR) 250-50 MCG/DOSE AEPB Inhale 1 puff into the lungs every 12 (twelve) hours. As needed    . guaiFENesin (ROBITUSSIN) 100 MG/5ML liquid Take 200 mg by mouth 4 (four) times daily as needed for cough.    Marland Kitchen HYDROcodone-homatropine (HYCODAN) 5-1.5 MG/5ML syrup Take 5 mLs by mouth every 6 (six) hours as needed for cough. 240 mL 0  . ibuprofen (ADVIL,MOTRIN) 200 MG tablet Take 400 mg by mouth every 6 (six) hours as needed for pain.    Marland Kitchen L-Methylfolate-B12-B6-B2 (CEREFOLIN) 04-08-49-5 MG TABS TAKE 1 TABLET DAILY 90 each 1  . levocetirizine (XYZAL) 5 MG tablet TAKE 1 TABLET EVERY EVENING 90 tablet 3  . levocetirizine  (XYZAL) 5 MG tablet Take 1 tablet (5 mg total) by mouth every evening. 90 tablet 3  . Multiple Vitamin (MULTIVITAMINS PO) Take 1 tablet by mouth at bedtime.     . Olopatadine HCl (PATADAY) 0.2 % SOLN Apply 1 drop to eye as needed (both eyes). 1 Bottle 2  . predniSONE (DELTASONE) 10 MG tablet 3 tabs by mouth per day for 3 days,2tabs per day for 3 days,1tab per day for 3 days 18 tablet 0  . primidone (MYSOLINE) 50 MG tablet TAKE 1 TABLET TWICE A DAY  AS DIRECTED 180 tablet 1  . solifenacin (VESICARE) 5 MG tablet Take 1 tablet (5 mg total) by mouth daily. 90 tablet 3  . azithromycin (ZITHROMAX Z-PAK) 250  MG tablet Use as directed (Patient not taking: Reported on 04/22/2015) 6 tablet 1   No current facility-administered medications on file prior to visit.    Review of Systems - per wife  Constitutional: Negative for unusual diaphoresis or night sweats HENT: Negative for ringing in ear or discharge Eyes: Negative for double vision or worsening visual disturbance.  Respiratory: Negative for choking and stridor.   Gastrointestinal: Negative for vomiting or other signifcant bowel change Genitourinary: Negative  change in urine volume.  Musculoskeletal: Negative for other MSK pain or swelling Skin: Negative for color change and worsening wound.  Neurological: Negative for tremors and numbness other than noted  Psychiatric/Behavioral: Negative for decreased concentration or agitation other than above       Objective:   Physical Exam BP 120/68 mmHg  Pulse 78  Temp(Src) 97.8 F (36.6 C) (Oral)  SpO2 95% VS noted, not ill apearing, obese, in whe Constitutional: Pt appears in no significant distress HENT: Head: NCAT.  Right Ear: External ear normal.  Left Ear: External ear normal.  Eyes: . Pupils are equal, round, and reactive to light. Conjunctivae and EOM are normal Neck: Normal range of motion. Neck supple.  Cardiovascular: Normal rate and regular rhythm.   Pulmonary/Chest: Effort normal  and breath sounds without rales or wheezing.  Abd:  Soft, NT, ND, + BS, no flank tender Neurological: Pt is alert. Not confused , motor grossly intact Skin: Skin is warm. Several areas to quarter size erythema slight raised nontender to upper and lower back,, no LE edema Psychiatric: Pt behavior is normal. No agitation.     Assessment & Plan:

## 2015-04-22 NOTE — Assessment & Plan Note (Signed)
stable overall by history and exam, recent data reviewed with pt, and pt to continue medical treatment as before,  to f/u any worsening symptoms or concerns BP Readings from Last 3 Encounters:  04/22/15 120/68  01/29/15 113/63  01/28/15 118/80

## 2015-04-22 NOTE — Assessment & Plan Note (Signed)
Suspect some atopic dermatitis vs other - for triam cr prn,  to f/u any worsening symptoms or concerns

## 2015-04-22 NOTE — Addendum Note (Signed)
Addended by: Biagio Borg on: 04/22/2015 12:45 PM   Modules accepted: Orders, SmartSet

## 2015-04-23 ENCOUNTER — Inpatient Hospital Stay: Admission: RE | Admit: 2015-04-23 | Payer: Medicare Other | Source: Ambulatory Visit

## 2015-04-24 ENCOUNTER — Ambulatory Visit (HOSPITAL_COMMUNITY): Payer: Medicare Other

## 2015-04-26 LAB — URINE CULTURE

## 2015-04-29 ENCOUNTER — Ambulatory Visit (HOSPITAL_COMMUNITY)
Admission: RE | Admit: 2015-04-29 | Discharge: 2015-04-29 | Disposition: A | Discharge status: Home / Self Care | Payer: Medicare Other | Source: Ambulatory Visit | Attending: Internal Medicine | Admitting: Internal Medicine

## 2015-04-29 ENCOUNTER — Encounter: Payer: Self-pay | Admitting: Neurology

## 2015-04-29 DIAGNOSIS — M549 Dorsalgia, unspecified: Secondary | ICD-10-CM | POA: Diagnosis not present

## 2015-04-29 DIAGNOSIS — K8020 Calculus of gallbladder without cholecystitis without obstruction: Secondary | ICD-10-CM | POA: Diagnosis not present

## 2015-04-29 DIAGNOSIS — N281 Cyst of kidney, acquired: Secondary | ICD-10-CM | POA: Diagnosis not present

## 2015-04-29 DIAGNOSIS — R310 Gross hematuria: Secondary | ICD-10-CM | POA: Insufficient documentation

## 2015-04-29 DIAGNOSIS — N200 Calculus of kidney: Secondary | ICD-10-CM | POA: Diagnosis not present

## 2015-04-30 ENCOUNTER — Encounter: Payer: Self-pay | Admitting: Neurology

## 2015-04-30 ENCOUNTER — Ambulatory Visit (INDEPENDENT_AMBULATORY_CARE_PROVIDER_SITE_OTHER): Payer: Medicare Other | Admitting: Neurology

## 2015-04-30 ENCOUNTER — Ambulatory Visit: Payer: Medicare Other | Admitting: Neurology

## 2015-04-30 VITALS — BP 124/64 | HR 88 | Resp 20 | Ht 69.0 in | Wt 235.0 lb

## 2015-04-30 DIAGNOSIS — G238 Other specified degenerative diseases of basal ganglia: Secondary | ICD-10-CM | POA: Diagnosis not present

## 2015-04-30 DIAGNOSIS — F0280 Dementia in other diseases classified elsewhere without behavioral disturbance: Secondary | ICD-10-CM | POA: Diagnosis not present

## 2015-04-30 DIAGNOSIS — G3183 Dementia with Lewy bodies: Secondary | ICD-10-CM | POA: Diagnosis not present

## 2015-04-30 DIAGNOSIS — G232 Striatonigral degeneration: Secondary | ICD-10-CM

## 2015-04-30 MED ORDER — CARBIDOPA-LEVODOPA ER 36.25-145 MG PO CPCR: 36.2500 mg | ORAL_CAPSULE | Freq: Three times a day (TID) | ORAL | Status: DC

## 2015-04-30 MED ORDER — CARBIDOPA-LEVODOPA 25-100 MG PO TABS: ORAL_TABLET | ORAL | Status: DC

## 2015-04-30 NOTE — Patient Instructions (Signed)
Carbidopa; Levodopa extended-release capsules What is this medicine? CARBIDOPA;LEVODOPA (kar bi DOE pa; lee voe DOE pa) is used to treat the symptoms of Parkinson's disease. This medicine may be used for other purposes; ask your health care provider or pharmacist if you have questions. COMMON BRAND NAME(S): Rytary What should I tell my health care provider before I take this medicine? They need to know if you have any of these conditions: -depression or other mental illness -diabetes -glaucoma -heart disease, including recent heart attack -history of irregular heart beat -kidney disease -liver disease -lung or breathing disease, like asthma -melanoma or suspicious skin lesions -stomach or intestine problems -an unusual or allergic reaction to levodopa, carbidopa, other medicines, foods, dyes, or preservatives -pregnant or trying to get pregnant -breast-feeding How should I use this medicine? Take this medicine by mouth with a glass of water. Follow the directions on the prescription label. Swallow whole. Do not crush, chew, or divide the capsules. If you have trouble swallowing, you may open the capsule and sprinkle the entire contents on 1 to 2 tablespoons of applesauce. Take the medicine/food mixture immediately, and do not store for future use. Take your doses at regular intervals. Do not take your medicine more often than directed. Do not stop taking except on the advice of your doctor or health care professional. A high fat, high calorie meal may slow the absorption of the medicine into your system and delay the onset of action by 2 to 3 hours. Consider taking the first dose of the day 1 to 2 hours before eating. If you develop nausea, the medicine may be taken with food. Talk to your pediatrician regarding the use of this medicine in children. Special care may be needed. Overdosage: If you think you've taken too much of this medicine contact a poison control center or emergency room at  once. Overdosage: If you think you have taken too much of this medicine contact a poison control center or emergency room at once. NOTE: This medicine is only for you. Do not share this medicine with others. What if I miss a dose? If you miss a dose, take it as soon as you can. If it is almost time for your next dose, take only that dose. Do not take double or extra doses. What may interact with this medicine? Do not take this medicine with any of the following medications: -MAOIs like Marplan, Nardil, and Parnate -reserpine -tetrabenazine This medicine may also interact with the following medications: -alcohol -droperidol -entacapone -iron supplements or multivitamins with iron -isoniazid, INH -linezolid -medicines for depression, anxiety, or psychotic disturbances -medicines for high blood pressure -medicines for sleep -metoclopramide -papaverine -procarbazine -tedizolid -rasagiline -selegiline -tolcapone This list may not describe all possible interactions. Give your health care provider a list of all the medicines, herbs, non-prescription drugs, or dietary supplements you use. Also tell them if you smoke, drink alcohol, or use illegal drugs. Some items may interact with your medicine. What should I watch for while using this medicine? Visit your doctor or health care professional for regular checks on your progress. It may be several weeks or months before you feel the full benefits of this medicine. Continue to take your medicine on a regular schedule. Do not take any additional medicines for Parkinson's disease without first consulting with your health care provider. You may get drowsy or dizzy. Do not drive, use machinery, or do anything that needs mental alertness until you know how this drug affects you. Do not stand  or sit up quickly, especially if you are an older patient. This reduces the risk of dizzy or fainting spells. Alcohol can make you more drowsy and dizzy. Avoid  alcoholic drinks. If you find that you have sudden feelings of wanting to sleep during normal activities, like cooking, watching television, or while driving or riding in a car, you should contact your health care professional. Dennis Bast may experience a 'wearing off' effect prior to the time for your next dose of this medicine. You may also experience an 'on-off' effect where the medicine apparently stops working for any time from a minute to several hours, then suddenly starts working again. Tell your doctor or health care professional if any of these symptoms happen to you. Your dose may need adjustment. A high protein meal can slow or prevent absorption of this medicine. Avoid high protein foods near the time of taking this medicine to help prevent these problems. Take this medicine at least 30 minutes before eating or one hour after meals. You may want to eat higher protein foods later in the day or in small amounts. If you have diabetes, you may get a false-positive result for sugar in your urine. Check with your doctor or health care professional. This medicine may discolor the urine or sweat, making it look darker or red in color. This is of no cause for concern. However, this may stain clothing or fabrics. There have been reports of increased sexual urges or other strong urges such as gambling while taking some medicines for Parkinson's disease. If you experience any of these urges while taking this medicine, you should report it to your health care provider as soon as possible. You should check your skin often for changes to moles and new growths while taking this medicine. Call your doctor if you notice any of these changes. What side effects may I notice from receiving this medicine? Side effects that you should report to your doctor or health care professional as soon as possible: -allergic reactions like skin rash, itching or hives, swelling of the face, lips, or tongue -anxiety, confusion, or  nervousness -falling asleep during normal activities like driving -fast, irregular heartbeat -hallucination, loss of contact with reality -mood changes like aggressive behavior, depression -stomach pain -trouble passing urine -uncontrolled movements of the mouth, head, hands, feet, shoulders, eyelids or other unusual muscle movements Side effects that usually do not require medical attention (Report these to your doctor or health care professional if they continue or are bothersome.): -headache -loss of appetite -muscle twitches -nausea/vomiting -nightmares, trouble sleeping This list may not describe all possible side effects. Call your doctor for medical advice about side effects. You may report side effects to FDA at 1-800-FDA-1088. Where should I keep my medicine? Keep out of the reach of children. Store at room temperature between 15 and 30 degrees C (59 and 86 degrees F). Protect from light and moisture. Throw away any unused medicine after the expiration date. NOTE: This sheet is a summary. It may not cover all possible information. If you have questions about this medicine, talk to your doctor, pharmacist, or health care provider.  2015, Elsevier/Gold Standard. (2013-12-21 13:32:33)

## 2015-04-30 NOTE — Progress Notes (Signed)
Subjective:    Patient ID: [name] [name] is a 80 y.o. male.  HPI:  Diabetes here on 6-20 2-16 for a revisit. His last visit in our office was was [name], NP. Here for his CPAP compliance report. The download dated 6-20 1-16 shows 93% compliance for days of use and over 4 hours of nightly use. The average CPAP use is 8 hours and 6 minutes the machine is set at 11 cm water with 3 cm EPR and his apnea index is 3.8. There is no need to adjust the machine based on those results. [name] advised me that in spite of having help at the home with healthcare she is unable now to use the Gordon lift which is a manually operated left for her husband he usually operates in a wheelchair and only transfers from bed to bathroom he is no longer ambulatory in other ways. She requests to be evaluated for a motorized [name]. In addition I have today to review a swallowing test from 02-18-15 the patient had a coughing spell when the barium tablet Stuck in the vallecula and he may have slightly aspirated at the time.  He was able however to take thin liquids, nectar thick liquids primary and a previous with a cracker. He has problems to swallow granular foods such as peas, rice,  corns, crumbs..  The also evaluated his memory concerns today again he was able to score in a Mini-Mental status examination 20 out of 29 points today he is unable to draw due to his illness but he had also trouble with spelling a word backwards and do a serial sevens. He thought it was Tuesday  (today is Wednesday) and the year  as 1900 , than as 2000. He has more comprehension difficulties . He has muc more tremor, treated partially with primidone an with sinemet.   He was in [place] for 12 Days with his wife, he even managed to take a water taxi through [place].  He has a lot of freezing of his steps. Had multiple falls, will trip over his feet. With the walker can walk up only to 2-5 feet.      1)Parkinson plus syndrome, Gait  instability - FACE TO FACE ; he needs a power wheelchair for mobility assistance Legs give out. [place] today here in the practice during face to face evaluation. .   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 Takes klonopin which has calmed this down. Wife notes a stridor type noise when he is sleeping. Denies any loss of sense of smell. CPAP user, but CPAP broke.  Currently taking Sinemet 25-100 1.5 tabs  4x a day at 10am, 2pm, 6pm and 10pm. Feels he gets some benefit from it. If he misses a dose or gets it later then he will have more difficulty moving.   History per prior notes;   [name] Like, DO  [name] [name] is a very pleasant 80 yo RH gentleman . He is accompanied by his wife today. He has an underlying complex history including parkinsonism, dementia, anxiety, OSA, restless leg syndrome, hearing loss, hypertension, heart disease with status post MI and status post CABG, atrial fibrillation, history of syncope, insomnia, history of colon cancer, asthma, spinal stenosis, thyroid disease, arthritis, and has had recurrent falls. He started seeing Dr. Brett Fairy I believe in 2011 and was most recently seen by our nurse practitioner in February 2014, at which time he was continued on Sinemet 3 times a day and advised  to reduce prednisone. His wife called today stating that to him he needed to be seen because of recurrent falls. His appointment was canceled for last week and he was not yet rescheduled for another appointment. He was seen in the emergency room on 05/15/2013 after a fall. He had fallen backwards hitting his head on concrete while trying to get into his car. His wife also reported increasing confusion over the past for 5 days prior to your ED visit. He had a CT head which showed stable atrophy and white matter changes and no acute abnormality. He had chest x-ray which was negative for pneumonia. Laboratory function showed normal electrolytes, normal CBC, normal urinalysis. His wife states, that is  legs give out and he has a tendency to fall backwards. He has had PT before and last went in November. PT has helped in the past. She would [name] a referral for PT again. He went to PT [facility] speech [facility] on [employer] [employer] [phone]).   He has RLS, RBD, OSA on CPAP. He has a very erratic sleep wake schedule. He and his wife go to bed late around 2 AM and he wakes up at 10, but goes back to sleep and over the course of the day, he takes at least 2 three-hour naps. He uses CPAP at night, but not for his naps.      His Past Medical History Is Significant For: Past Medical History  Diagnosis Date  . ONYCHOMYCOSIS, TOENAILS 06/27/2008  . HYPERLIPIDEMIA 07/28/2007  . ANXIETY 09/28/2007  . OBSTRUCTIVE SLEEP APNEA 01/01/2008  . RESTLESS LEGS SYNDROME 12/26/2007  . HEARING LOSS 06/27/2008  . HYPERTENSION 07/28/2007  . MYOCARDIAL INFARCTION, HX OF 07/28/2007  . CORONARY ARTERY DISEASE 07/28/2007  . Atrial fibrillation 09/28/2007  . SINUSITIS- ACUTE-NOS 10/10/2007  . ALLERGIC RHINITIS 09/28/2007  . CHOLELITHIASIS 09/28/2007  . BENIGN PROSTATIC HYPERTROPHY 09/28/2007  . CELLULITIS, HAND, LEFT 09/24/2010  . SYNCOPE 07/28/2007  . INSOMNIA-SLEEP DISORDER-UNSPEC 04/03/2009  . HYPERSOMNIA WITH SLEEP APNEA UNSPECIFIED 12/26/2007  . FATIGUE 01/01/2008  . DYSPNEA 04/03/2009  . Wheezing 11/06/2009  . ANGIOEDEMA 11/06/2009  . COLON CANCER, HX OF 07/28/2007  . PROSTATE CANCER, HX OF 07/28/2007  . COLONIC POLYPS, HX OF 07/28/2007  . Long term current use of anticoagulant 12/22/2010  . Asthma   . DEMENTIA   . Spinal stenosis   . History of atrial fibrillation   . Lumbar stenosis 10/30/2011  . CAD (coronary artery disease)     Severe three-vessel coronary disease. LIMA to the LAD patent. SVG to PDA patent. SVG to OM 3 patent. Preserved ejection fraction. 2008.  . Cancer   . Thyroid disease   . Arthritis   . Pneumonia   . 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 Sepsis with acute organ  dysfunction 09/21/2011  . 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 Cervical disc disorder with radiculopathy of cervical region 04/15/2014  . 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 Multiple system atrophy, Parkinson variant 10/01/2014    His Past Surgical History Is Significant For: Past Surgical History  Procedure Laterality Date  . Prostatectomy  2001  . Cardioversion  2007  . Stress cardiolite  02/08/2007  . Schocardiograph  10/07/2006  . Right thyroid lobectomy    . Coronary artery bypass graft  1995    x 3  . Hernia repair  1998  . Colonoscopy  2010    last  done 2010- clear  . Cardiac electrophysiology study and ablation    . Thyroidectomy, partial      His Family History Is Significant For: Family History  Problem Relation Age of Onset  . Coronary artery disease Father     before 42 yo  . Coronary artery disease Sister     before 57 yo  . Hypertension Other     His Social History Is Significant For: History   Social History  . Marital Status: Married    Spouse Name: Diane  . Number of Children: 2  . Years of Education: 14   Occupational History  . retired Health visitor    Social History Main Topics  . Smoking status: Never Smoker   . Smokeless tobacco: Never Used  . Alcohol Use: No  . Drug Use: No  . Sexual Activity: Not on file   Other Topics Concern  . None   Social History Narrative   Patient is married (Diane) and lives at home with his wife.   Patient has 7 children.   Patient is retired.   Patient has a Associates degree.   Patient is right-handed.   Patient does not drink any caffeine.    His Allergies Are:  Allergies  Allergen Reactions  . Lisinopril Swelling  . Statins     parkinsons   . Thorazine [Chlorpromazine]     Confusion/loopy  :   His Current Medications Are:  Outpatient Encounter Prescriptions as of 04/30/2015  Medication Sig  . acetaminophen  (TYLENOL) 500 MG tablet Take 500 mg by mouth every 4 (four) hours as needed.  Marland Kitchen albuterol (PROAIR HFA) 108 (90 BASE) MCG/ACT inhaler Inhale 2 puffs into the lungs every 4 (four) hours as needed. For wheezing  . ALPRAZolam (XANAX) 0.5 MG tablet Take 1 tablet (0.5 mg total) by mouth daily as needed for anxiety. As needed  . amLODipine (NORVASC) 5 MG tablet Take 1 tablet (5 mg total) by mouth daily.  Marland Kitchen buPROPion (WELLBUTRIN XL) 150 MG 24 hr tablet Take 1 tablet (150 mg total) by mouth daily.  . cephALEXin (KEFLEX) 500 MG capsule Take 1 capsule (500 mg total) by mouth 4 (four) times daily.  . Cholecalciferol (VITAMIN D3) 2000 UNITS TABS Take 1 tablet by mouth daily.   . clonazePAM (KLONOPIN) 1 MG tablet Take 2 tablets (2 mg total) by mouth at bedtime. Can take 1/2 tablet more, if having difficulty sleeping.  . Coenzyme Q10 (CO Q 10 PO) Take 1 tablet by mouth at bedtime.  Marland Kitchen desloratadine (CLARINEX) 5 MG tablet Take 1 tablet (5 mg total) by mouth daily.  Marland Kitchen docusate sodium (COLACE) 100 MG capsule Take 300 mg by mouth at bedtime.   . donepezil (ARICEPT) 23 MG TABS tablet TAKE 1 TABLET EVERY MORNING  . ELIQUIS 5 MG TABS tablet TAKE 1 TABLET TWICE A DAY  . FLUoxetine (PROZAC) 40 MG capsule Take 1 capsule (40 mg total) by mouth every morning.  . fluticasone (VERAMYST) 27.5 MCG/SPRAY nasal spray Place 1 spray into the nose 2 (two) times daily.  . Fluticasone-Salmeterol (ADVAIR) 250-50 MCG/DOSE AEPB Inhale 1 puff into the lungs every 12 (twelve) hours. As needed  . HYDROcodone-homatropine (HYCODAN) 5-1.5 MG/5ML syrup Take 5 mLs by mouth every 6 (six) hours as needed for cough.  Marland Kitchen ibuprofen (ADVIL,MOTRIN) 200 MG tablet Take 400 mg by mouth every 6 (six) hours as needed for pain.  Marland Kitchen L-Methylfolate-B12-B6-B2 (CEREFOLIN) 04-08-49-5 MG TABS TAKE 1 TABLET DAILY  . levocetirizine (XYZAL)  5 MG tablet Take 1 tablet (5 mg total) by mouth every evening.  . Multiple Vitamin (MULTIVITAMINS PO) Take 1 tablet by mouth at  bedtime.   . Olopatadine HCl (PATADAY) 0.2 % SOLN Apply 1 drop to eye as needed (both eyes).  . primidone (MYSOLINE) 50 MG tablet TAKE 1 TABLET TWICE A DAY  AS DIRECTED  . solifenacin (VESICARE) 5 MG tablet Take 1 tablet (5 mg total) by mouth daily.  Marland Kitchen triamcinolone cream (KENALOG) 0.1 % Apply 1 application topically 2 (two) times daily.  . [DISCONTINUED] carbidopa-levodopa (SINEMET IR) 25-100 MG per tablet TAKE 1 AND 1/2 TABLETS 4 TIMES A DAY AT 10AM, 2PM, 6PM AND 10PM  . [DISCONTINUED] carbidopa-levodopa (SINEMET IR) 25-100 MG per tablet TAKE 1 AND 1/2 TABLETS 4 TIMES A DAY AT 10AM, 2PM, 6PM AND 10PM  . Carbidopa-Levodopa ER (RYTARY) 36.25-145 MG CPCR Take 36.25 mg by mouth 3 (three) times daily after meals.  . [DISCONTINUED] azithromycin (ZITHROMAX Z-PAK) 250 MG tablet Use as directed (Patient not taking: Reported on 04/22/2015)  . [DISCONTINUED] guaiFENesin (ROBITUSSIN) 100 MG/5ML liquid Take 200 mg by mouth 4 (four) times daily as needed for cough.  . [DISCONTINUED] levocetirizine (XYZAL) 5 MG tablet TAKE 1 TABLET EVERY EVENING  . [DISCONTINUED] predniSONE (DELTASONE) 10 MG tablet 3 tabs by mouth per day for 3 days,2tabs per day for 3 days,1tab per day for 3 days   No facility-administered encounter medications on file as of 04/30/2015.   Review of Systems  Constitutional: Positive for fatigue.  HENT: Positive for hearing loss, rhinorrhea, trouble swallowing and tinnitus.   Respiratory: Positive for shortness of breath and wheezing.        Uses a CPAP  Gastrointestinal: Positive for constipation.  Musculoskeletal: Positive for myalgias and arthralgias.  Allergic/Immunologic: Positive for environmental allergies.  Neurological: Positive for tremors.       Memory loss Restless leg  Hematological: Bruises/bleeds easily.  Psychiatric/Behavioral: Positive for confusion, sleep disturbance and dysphoric mood. The patient alert .     Objective:  Neurologic Exam  Physical Exam Physical  Examination:   Filed Vitals:   04/30/15 1333  BP: 124/64  Pulse: 88  Resp: 20   His repeat sitting blood pressure was 121/67 with a pulse of 57 and standing blood pressure was 129/68 with a pulse of 60. He did not report any lightheadedness upon standing.  General Examination: The patient is a very pleasant 80 y.o. male in no acute distress.  HEENT: Normocephalic, atraumatic, pupils are equal, round and reactive to light and accommodation. Funduscopic exam is normal with sharp disc margins noted. Extraocular tracking shows mild saccadic breakdown without nystagmus noted. There is limitation to upper gaze. There is mild decrease in eye blink rate. Hearing is intact. Tympanic membranes are clear bilaterally. Face is symmetric with mild facial masking and normal facial sensation.  There is no lip, neck or jaw tremor. Neck is moderately rigid with intact passive ROM. There are no carotid bruits on auscultation. Tongue protrudes centrally and palate elevates symmetrically.   There is no drooling.   Neurologically:  Mental status: The patient is awake and alert, paying good  attention. He is able to partially provide the history. Masked face.  His wife provides details of the history. His  MMSE was 20 out of 29,  There is a mild degree of bradyphrenia. Speech is hypophonic and dysarthria is noted. Mood is ' gloomy" .  Cranial nerves are as described above under HEENT exam. In addition, shoulder  shrug is normal with equal shoulder height noted. Motor exam: Normal bulk, and strength for age is noted. There are no dyskinesias noted.   Tone is  rigid with strong biceps and wrist cogwheeling. There is overall severe  bradykinesia. There is no drift or rebound.  The patient has a resting tremor that is more dominant in the left than in the right hand but the amplitude has definitely increased since his last visit. There is also the occasional jerk in both arms. His feet standstill and he seems not to  have any tremor  involving the lower extremity.   Reflexes are 3+ in the upper extremities and 1 plus patella / lower extremities.    Fine motor skills exam: Finger taps are impaired on both sides ,  Hand movements are impaired . RAP (rapid alternating patting) is severly  impaired bilaterally.  Foot taps are moderately impaired on the right and moderately impaired on the left.  Foot agility (in the form of heel stomping) is moderately impaired on both sides. Brief myoclonic jerks of bilateral UE noted.  Cerebellar testing shows dysmetria but not intention tremor on finger to nose testing. He has resting tremor on the left hand, pill rolling.   Sensory exam is intact to light touch, pinprick, vibration, temperature sense and proprioception in the upper and lower extremities.   Gait, station and balance:  He requires assistance to stands up from the seated position from his wheelchair.Marland Kitchen Requires assistance totransfer, is dysphonic and trembling . Unsteady.  Stance is wide-based.  Tandem walk is not possible. Balance is now severely impaired, unable to do a toe or heel stance.      Assessment and Plan:   1) Parkinson plus syndrome, Gait instability - FACE TO FACE ; he needs a power wheelchair for mobility assistance. This visit was precipitated by the patient's frequent falls the last on right here in the office witnessed today. He will need a power wheelchair. He is able to use his hands to steer the device but I would not want him to have a manual wheelchair as he doesn't have the strength to propel himself forward. I'm also referring the patient for physical therapy evaluation with a goal of measuring for the power wheelchair and to specify a design the height of the seat and any other specific patient's physical therapy.  3) OSA, currently untreated as CPAP machine broke.  He is followed by [facility] - medical necessity to be faxed to [facility] here. Fax  [phone]. CPAP , 11 CM water,  FFM.   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 memory loss, gait disorder, likely multifactorial in nature.  Dr Janann Colonel had increased his SInemet 25-100 to 1.5 tablets 4x a day. This helped him to move better, but did not affect the dysautonomia.  He seems to have declined further, walks only 20 steps. He needs a Power wheelchair. Needs a [employer] fast streamer.   Follow up with my NP or me, Dr Brett Fairy,  in 4 months.  Consider change to due dopa or Rytary .  He has frequent UTI's and Ihe feels unsure about a feeding tube placement for Duodopa. He could potentially benefit form droxidopa.

## 2015-05-19 ENCOUNTER — Other Ambulatory Visit: Payer: Self-pay | Admitting: Internal Medicine

## 2015-05-19 ENCOUNTER — Other Ambulatory Visit: Payer: Self-pay | Admitting: Neurology

## 2015-05-21 ENCOUNTER — Other Ambulatory Visit: Payer: Self-pay | Admitting: Neurology

## 2015-05-21 ENCOUNTER — Other Ambulatory Visit: Payer: Self-pay

## 2015-05-21 DIAGNOSIS — G232 Striatonigral degeneration: Secondary | ICD-10-CM

## 2015-05-21 DIAGNOSIS — G4752 REM sleep behavior disorder: Secondary | ICD-10-CM

## 2015-05-21 MED ORDER — CLONAZEPAM 1 MG PO TABS: 2.0000 mg | ORAL_TABLET | Freq: Every day | ORAL | Status: DC

## 2015-05-21 NOTE — Telephone Encounter (Signed)
Rx signed and faxed.

## 2015-05-21 NOTE — Telephone Encounter (Signed)
Duplicate request

## 2015-06-03 ENCOUNTER — Encounter: Payer: Self-pay | Admitting: Internal Medicine

## 2015-06-10 ENCOUNTER — Other Ambulatory Visit: Payer: Self-pay | Admitting: Neurology

## 2015-06-13 ENCOUNTER — Other Ambulatory Visit: Payer: Self-pay | Admitting: Internal Medicine

## 2015-07-03 ENCOUNTER — Encounter (HOSPITAL_COMMUNITY): Payer: Self-pay | Admitting: Emergency Medicine

## 2015-07-03 ENCOUNTER — Emergency Department (HOSPITAL_COMMUNITY)
Admission: EM | Admit: 2015-07-03 | Discharge: 2015-07-04 | Disposition: A | Discharge status: Home / Self Care | Payer: Medicare Other | Attending: Emergency Medicine | Admitting: Emergency Medicine

## 2015-07-03 ENCOUNTER — Telehealth: Payer: Self-pay | Admitting: Internal Medicine

## 2015-07-03 DIAGNOSIS — R21 Rash and other nonspecific skin eruption: Secondary | ICD-10-CM | POA: Insufficient documentation

## 2015-07-03 DIAGNOSIS — F419 Anxiety disorder, unspecified: Secondary | ICD-10-CM | POA: Insufficient documentation

## 2015-07-03 DIAGNOSIS — I252 Old myocardial infarction: Secondary | ICD-10-CM | POA: Diagnosis not present

## 2015-07-03 DIAGNOSIS — Z86010 Personal history of colonic polyps: Secondary | ICD-10-CM | POA: Insufficient documentation

## 2015-07-03 DIAGNOSIS — I517 Cardiomegaly: Secondary | ICD-10-CM | POA: Diagnosis not present

## 2015-07-03 DIAGNOSIS — Z7901 Long term (current) use of anticoagulants: Secondary | ICD-10-CM | POA: Diagnosis not present

## 2015-07-03 DIAGNOSIS — Z85038 Personal history of other malignant neoplasm of large intestine: Secondary | ICD-10-CM | POA: Insufficient documentation

## 2015-07-03 DIAGNOSIS — Z8739 Personal history of other diseases of the musculoskeletal system and connective tissue: Secondary | ICD-10-CM | POA: Diagnosis not present

## 2015-07-03 DIAGNOSIS — Z8619 Personal history of other infectious and parasitic diseases: Secondary | ICD-10-CM | POA: Insufficient documentation

## 2015-07-03 DIAGNOSIS — I480 Paroxysmal atrial fibrillation: Secondary | ICD-10-CM

## 2015-07-03 DIAGNOSIS — Z87438 Personal history of other diseases of male genital organs: Secondary | ICD-10-CM | POA: Diagnosis not present

## 2015-07-03 DIAGNOSIS — I2510 Atherosclerotic heart disease of native coronary artery without angina pectoris: Secondary | ICD-10-CM | POA: Diagnosis not present

## 2015-07-03 DIAGNOSIS — Z8546 Personal history of malignant neoplasm of prostate: Secondary | ICD-10-CM | POA: Insufficient documentation

## 2015-07-03 DIAGNOSIS — R0602 Shortness of breath: Secondary | ICD-10-CM | POA: Diagnosis not present

## 2015-07-03 DIAGNOSIS — Z7951 Long term (current) use of inhaled steroids: Secondary | ICD-10-CM | POA: Diagnosis not present

## 2015-07-03 DIAGNOSIS — H9190 Unspecified hearing loss, unspecified ear: Secondary | ICD-10-CM | POA: Diagnosis not present

## 2015-07-03 DIAGNOSIS — R066 Hiccough: Secondary | ICD-10-CM

## 2015-07-03 DIAGNOSIS — J986 Disorders of diaphragm: Secondary | ICD-10-CM | POA: Diagnosis not present

## 2015-07-03 DIAGNOSIS — Z7952 Long term (current) use of systemic steroids: Secondary | ICD-10-CM | POA: Diagnosis not present

## 2015-07-03 DIAGNOSIS — G20 Parkinson's disease: Secondary | ICD-10-CM

## 2015-07-03 DIAGNOSIS — Z8701 Personal history of pneumonia (recurrent): Secondary | ICD-10-CM | POA: Insufficient documentation

## 2015-07-03 DIAGNOSIS — G20C Parkinsonism, unspecified: Secondary | ICD-10-CM

## 2015-07-03 DIAGNOSIS — Z872 Personal history of diseases of the skin and subcutaneous tissue: Secondary | ICD-10-CM | POA: Diagnosis not present

## 2015-07-03 DIAGNOSIS — Z951 Presence of aortocoronary bypass graft: Secondary | ICD-10-CM | POA: Insufficient documentation

## 2015-07-03 DIAGNOSIS — Z8719 Personal history of other diseases of the digestive system: Secondary | ICD-10-CM | POA: Diagnosis not present

## 2015-07-03 DIAGNOSIS — F0390 Unspecified dementia without behavioral disturbance: Secondary | ICD-10-CM | POA: Diagnosis not present

## 2015-07-03 DIAGNOSIS — J45909 Unspecified asthma, uncomplicated: Secondary | ICD-10-CM | POA: Insufficient documentation

## 2015-07-03 DIAGNOSIS — I10 Essential (primary) hypertension: Secondary | ICD-10-CM | POA: Insufficient documentation

## 2015-07-03 DIAGNOSIS — R112 Nausea with vomiting, unspecified: Secondary | ICD-10-CM | POA: Diagnosis not present

## 2015-07-03 MED ORDER — DIPHENHYDRAMINE HCL 50 MG/ML IJ SOLN
12.5000 mg | Freq: Once | INTRAMUSCULAR | Status: AC
  Administered 2015-07-04: 12.5 mg via INTRAVENOUS
  Filled 2015-07-03: qty 1

## 2015-07-03 MED ORDER — METOCLOPRAMIDE HCL 5 MG/ML IJ SOLN
10.0000 mg | Freq: Once | INTRAMUSCULAR | Status: AC
  Administered 2015-07-04: 10 mg via INTRAVENOUS
  Filled 2015-07-03: qty 2

## 2015-07-03 NOTE — ED Notes (Signed)
Pt arrives from home via EMS with c/o hiccups for 5 days. Per EMS hiccups increase SHOB. Pt denies pain and SHOB at this time.

## 2015-07-03 NOTE — Telephone Encounter (Signed)
Patient's wife reports patient with hiccups since Saturday. She is advised that he should discuss with his primary care.  He was treated last year when he had them for 14 days with thorazine, but his wife states that it did not work.  She is advised to contact his primary care to discuss alternatives.  She states it is very difficult to get him up and around with his Parkinson's and wants to minimize trips to the doctor.  She is advised that she should contact Dr. Jenny Reichmann to inquire about an alternative.  She is advised that I do not have anyone to see him until mid next week. Discussed with ,Nicoletta Ba PA he should see primary care and only come to GI if he is refractory to other treatments.

## 2015-07-03 NOTE — ED Provider Notes (Addendum)
CSN: [id number]     Arrival date & time 07/03/15  2329 History  This chart was scribed for [name] Flemings, MD by Randa Evens, ED Scribe. This patient was seen in room A03C/A03C and the patient's care was started at 11:36 PM.      Chief Complaint  Patient presents with  . Hiccups   The history is provided by the patient and the EMS personnel. No language interpreter was used.   HPI Comments: [name] [name] is a 80 y.o. male with extensive medical Hx ;isted below brought in by ambulance, who presents to the Emergency Department complaining of hiccups onset 5 days prior. Pt states that he feels like he cant get a deep breath due to the hiccups. Pt reports associated nausea and vomiting. Pt presents with a catheter placed and states that he has it everyday due to not being able to walk. Pt states that he is not able to ambulate at baseline. Pt doesn't report CP or abdominal pain. Pt reports Hx of similar hiccups 1 year prior that lasted for 1 day but resolved on its own. Pt is poor historian.  Wife now at bedside, pt with h/o dementia with Parkinsons.  She had prior history of hiccups lasting several days that did eventually resolve on its own.  Thorazine was tried, but caused hallucinations.  Patient has had difficulty feeding himself and sleeping secondary to constant hiccups.  Wife reports he is not normally in atrial fibrillation, and reports that he has been in sinus rhythm since his last cardioversion 3 years ago.  Patient and wife deny any fevers, chills, no cough.  They have tried abortive techniques at home without improvement.  Past Medical History  Diagnosis Date  . ONYCHOMYCOSIS, TOENAILS 06/27/2008  . HYPERLIPIDEMIA 07/28/2007  . ANXIETY 09/28/2007  . OBSTRUCTIVE SLEEP APNEA 01/01/2008  . RESTLESS LEGS SYNDROME 12/26/2007  . HEARING LOSS 06/27/2008  . HYPERTENSION 07/28/2007  . MYOCARDIAL INFARCTION, HX OF 07/28/2007  . CORONARY ARTERY DISEASE 07/28/2007  . Atrial fibrillation 09/28/2007  .  SINUSITIS- ACUTE-NOS 10/10/2007  . ALLERGIC RHINITIS 09/28/2007  . CHOLELITHIASIS 09/28/2007  . BENIGN PROSTATIC HYPERTROPHY 09/28/2007  . CELLULITIS, HAND, LEFT 09/24/2010  . SYNCOPE 07/28/2007  . INSOMNIA-SLEEP DISORDER-UNSPEC 04/03/2009  . HYPERSOMNIA WITH SLEEP APNEA UNSPECIFIED 12/26/2007  . FATIGUE 01/01/2008  . DYSPNEA 04/03/2009  . Wheezing 11/06/2009  . ANGIOEDEMA 11/06/2009  . COLON CANCER, HX OF 07/28/2007  . PROSTATE CANCER, HX OF 07/28/2007  . COLONIC POLYPS, HX OF 07/28/2007  . Long term current use of anticoagulant 12/22/2010  . Asthma   . DEMENTIA   . Spinal stenosis   . History of atrial fibrillation   . Lumbar stenosis 10/30/2011  . CAD (coronary artery disease)     Severe three-vessel coronary disease. LIMA to the LAD patent. SVG to PDA patent. SVG to OM 3 patent. Preserved ejection fraction. 2008.  . Cancer   . Thyroid disease   . Arthritis   . Pneumonia   . 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 Sepsis with acute organ dysfunction 09/21/2011  . 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 Cervical disc disorder with radiculopathy of cervical region 04/15/2014  . 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 Multiple system atrophy, Parkinson variant 10/01/2014   Past Surgical History  Procedure Laterality Date  . Prostatectomy  2001  . Cardioversion  2007  . Stress cardiolite  02/08/2007  . Schocardiograph  10/07/2006  . Right thyroid lobectomy    . Coronary artery bypass graft  1995    x 3  . Hernia repair  1998  . Colonoscopy  2010    last done 2010- clear  . Cardiac electrophysiology study and ablation    . Thyroidectomy, partial     Family History  Problem Relation Age of Onset  . Coronary artery disease Father     before 48 yo  . Coronary artery disease Sister     before 41 yo  . Hypertension Other    Social History  Substance Use Topics  . Smoking status: Never  Smoker   . Smokeless tobacco: Never Used  . Alcohol Use: No    Review of Systems  Cardiovascular: Negative for chest pain.  Gastrointestinal: Positive for nausea and vomiting. Negative for abdominal pain.  All other systems reviewed and are negative.     Allergies  Lisinopril; Statins; and Thorazine  Home Medications   Prior to Admission medications   Medication Sig Start Date End Date Taking? Authorizing Provider  acetaminophen (TYLENOL) 500 MG tablet Take 500 mg by mouth every 4 (four) hours as needed.    Historical Provider, MD  ALPRAZolam Duanne Moron) 0.5 MG tablet Take 1 tablet (0.5 mg total) by mouth daily as needed for anxiety. As needed 02/25/15   Biagio Borg, MD  amLODipine (NORVASC) 5 MG tablet Take 1 tablet (5 mg total) by mouth daily. 07/30/14   Biagio Borg, MD  buPROPion (WELLBUTRIN XL) 150 MG 24 hr tablet TAKE 1 TABLET DAILY 06/13/15   Biagio Borg, MD  cephALEXin (KEFLEX) 500 MG capsule Take 1 capsule (500 mg total) by mouth 4 (four) times daily. 04/22/15   Biagio Borg, MD  Cholecalciferol (VITAMIN D3) 2000 UNITS TABS Take 1 tablet by mouth daily.     Historical Provider, MD  clonazePAM (KLONOPIN) 1 MG tablet Take 2 tablets (2 mg total) by mouth at bedtime. Can take 1/2 tablet more, if having difficulty sleeping. 05/21/15   Larey Seat, MD  Coenzyme Q10 (CO Q 10 PO) Take 1 tablet by mouth at bedtime.    Historical Provider, MD  desloratadine (CLARINEX) 5 MG tablet Take 1 tablet (5 mg total) by mouth daily. 04/09/14   Biagio Borg, MD  docusate sodium (COLACE) 100 MG capsule Take 300 mg by mouth at bedtime.     Historical Provider, MD  donepezil (ARICEPT) 23 MG TABS tablet TAKE 1 TABLET EVERY MORNING 02/24/15   Larey Seat, MD  ELIQUIS 5 MG TABS tablet TAKE 1 TABLET TWICE A DAY 12/26/14   Biagio Borg, MD  FLUoxetine (PROZAC) 40 MG capsule Take 1 capsule (40 mg total) by mouth every morning. 07/09/14   Biagio Borg, MD  Fluticasone-Salmeterol (ADVAIR) 250-50 MCG/DOSE AEPB  Inhale 1 puff into the lungs every 12 (twelve) hours. As needed 10/12/13   Biagio Borg, MD  HYDROcodone-homatropine [facility]) 5-1.5 MG/5ML syrup Take 5 mLs by mouth every 6 (six) hours as needed for cough. 01/28/15   Biagio Borg, MD  ibuprofen (ADVIL,MOTRIN) 200 MG tablet Take 400 mg by mouth every 6 (six) hours as needed for pain.    Historical Provider, MD  L-Methylfolate-B12-B6-B2 (CEREFOLIN) 04-08-49-5 MG TABS TAKE 1 TABLET DAILY 03/24/15   Biagio Borg, MD  levocetirizine Harlow Ohms) 5 MG tablet Take 1 tablet (5 mg total) by mouth every evening. 12/31/14   Biagio Borg, MD  Multiple Vitamin (MULTIVITAMINS PO) Take 1 tablet  by mouth at bedtime.     Historical Provider, MD  Olopatadine HCl (PATADAY) 0.2 % SOLN Apply 1 drop to eye as needed (both eyes). 01/28/15   Biagio Borg, MD  primidone (MYSOLINE) 50 MG tablet TAKE 1 TABLET TWICE A DAY  AS DIRECTED 02/24/15   Larey Seat, MD  PROAIR HFA 108 (90 BASE) MCG/ACT inhaler USE 2 INHALATIONS ORALLY   EVERY 4 HOURS AS NEEDED Shawna Clamp 05/19/15   Biagio Borg, MD  RYTARY 36.25-145 MG CPCR TAKE ONE CAPSULE BY MOUTH 3 TIMES A DAY WITH MEALS 06/10/15   Larey Seat, MD  solifenacin (VESICARE) 5 MG tablet Take 1 tablet (5 mg total) by mouth daily. 11/27/14   Biagio Borg, MD  triamcinolone cream (KENALOG) 0.1 % Apply 1 application topically 2 (two) times daily. 04/22/15   Biagio Borg, MD  VERAMYST 27.5 MCG/SPRAY nasal spray USE 1 SPRAY NASALLY TWICE  DAILY 05/19/15   Biagio Borg, MD   BP 137/83 mmHg  Pulse 78  Temp(Src) 98.9 F (37.2 C) (Oral)  Resp 18  SpO2 95%   Physical Exam  Constitutional: He appears well-developed and well-nourished.  Elderly male, uncomfortable appearing, hiccups present  HENT:  Head: Normocephalic and atraumatic.  Nose: Nose normal.  Mouth/Throat: Oropharynx is clear and moist.  Eyes: Conjunctivae and EOM are normal. Pupils are equal, round, and reactive to light.  Neck: Normal range of motion. Neck supple. No JVD present.  No tracheal deviation present. No thyromegaly present.  Cardiovascular: Normal heart sounds and intact distal pulses.  Exam reveals no gallop and no friction rub.   No murmur heard. Irregular irregular  Pulmonary/Chest: Effort normal and breath sounds normal. No stridor. No respiratory distress. He has no wheezes. He has no rales. He exhibits no tenderness.  Abdominal: Soft. Bowel sounds are normal. He exhibits no distension and no mass. There is no tenderness. There is no rebound and no guarding.  Musculoskeletal: He exhibits no edema or tenderness.  Lymphadenopathy:    He has no cervical adenopathy.  Neurological: He is alert. He displays normal reflexes. He exhibits normal muscle tone. Coordination normal.  Oriented to self, hospital, month  Skin: Skin is warm and dry. Rash noted. No erythema. No pallor.  Rash under breasts bilaterally  Nursing note and vitals reviewed.   ED Course  Procedures (including critical care time) DIAGNOSTIC STUDIES: Oxygen Saturation is 95% on RA, adequate by my interpretation.    COORDINATION OF CARE: 11:46 PM-Discussed treatment plan with pt at bedside and pt agreed to plan.     Labs Review Labs Reviewed  BASIC METABOLIC PANEL - Abnormal; Notable for the following:    Glucose, Bld 106 (*)    All other components within normal limits  HEPATIC FUNCTION PANEL - Abnormal; Notable for the following:    ALT 8 (*)    All other components within normal limits  URINALYSIS, ROUTINE W REFLEX MICROSCOPIC (NOT AT [facility]) - Abnormal; Notable for the following:    APPearance CLOUDY (*)    Hgb urine dipstick MODERATE (*)    Nitrite POSITIVE (*)    Leukocytes, UA LARGE (*)    All other components within normal limits  URINE MICROSCOPIC-ADD ON - Abnormal; Notable for the following:    Bacteria, UA MANY (*)    All other components within normal limits  CBC WITH DIFFERENTIAL/PLATELET    Imaging Review Dg Chest 2 View  07/04/2015   CLINICAL DATA:  Hiccups for  5 days.  EXAM: CHEST  2 VIEW  COMPARISON:  03/29/2014  FINDINGS: Postoperative changes in the mediastinum. Shallow inspiration with elevation of the left hemidiaphragm. Mild cardiac enlargement without vascular congestion or edema. No consolidation. No blunting of costophrenic angles. No pneumothorax. Mediastinal contours appear intact. Degenerative changes in the left shoulder.  IMPRESSION: Mild cardiac enlargement. Elevation of left hemidiaphragm. No evidence of active pulmonary disease.   Electronically Signed   By: Lucienne Capers M.D.   On: 07/04/2015 00:40   I have personally reviewed and evaluated these images and lab results as part of my medical decision-making.   EKG Interpretation   Date/Time:  Thursday July 03 2015 23:36:12 EDT Ventricular Rate:  79 PR Interval:    QRS Duration: 103 QT Interval:  390 QTC Calculation: 447 R Axis:   -131 Text Interpretation:  Atrial fibrillation Probable lateral infarct, old  Confirmed by Glendi Mohiuddin  MD, Joal Eakle ([id number]) on 07/04/2015 12:37:31 AM      MDM   Final diagnoses:  Intractable hiccups  Atypical Parkinsonism  Paroxysmal atrial fibrillation       I personally performed the services described in this documentation, which was scribed in my presence. The recorded information has been reviewed and is accurate.  80 year old male with intractable hiccups for the last 5 days.  Patient on multiple medications, has no done well with Thorazine in the past.  Will try Reglan and Benadryl.  Will discuss with neurology as patient has ongoing issues with Parkinson disease and I am unsure if this could be contributing.  Wife is concerned as patient is losing ADLs since being sleep deprived and weakened by hiccups.  Patient back in atrial fibrillation.  Wife is concerned and wishes him to have cardioversion.  At this time.  Rate is controlled, and although anticoagulated.  Do not feel that he needs emergent cardioversion.   12:55 AM Case discussed with  Dr. Janann Colonel, on-call for neurology.  He reports it is possible that the Parkinson's is attributing to the hiccups, but not typical.  He recommends giving additional Sinemet, Valium.  2:11 AM Pt with persistent hiccups despite interventions.  Pt with limitations getting to/from doctor, unable to eat well due to hiccups, appears dehydrated.  Will d/w hospitalist.  [name] Flemings, MD 07/04/15 [id number]  2:45 AM D/w Hospitalist, Dr Fabio Neighbors who refuses admission, recommends consulting gi for recommendations.  D/w Dr Henrene Pastor with GI who does not have any recommendations at this time.  Will try Haldol.  4:14 AM Pt with resolution of hiccups briefly 40 min after haldol given, just after given baclofen.  Discussed with wife and patient that doesn't meet criteria for admission, no recommendations from GI, f/u with neurology.  Just prior to d/c, hiccups started again.  I ordered 2 mg of haldol IM.  Prescriptions given.  [name] Flemings, MD 07/04/15 [phone]

## 2015-07-04 ENCOUNTER — Emergency Department (HOSPITAL_COMMUNITY): Payer: Medicare Other

## 2015-07-04 DIAGNOSIS — R531 Weakness: Secondary | ICD-10-CM | POA: Diagnosis not present

## 2015-07-04 DIAGNOSIS — R066 Hiccough: Secondary | ICD-10-CM | POA: Diagnosis not present

## 2015-07-04 DIAGNOSIS — J986 Disorders of diaphragm: Secondary | ICD-10-CM | POA: Diagnosis not present

## 2015-07-04 DIAGNOSIS — I517 Cardiomegaly: Secondary | ICD-10-CM | POA: Diagnosis not present

## 2015-07-04 LAB — URINALYSIS, ROUTINE W REFLEX MICROSCOPIC
BILIRUBIN URINE: NEGATIVE
GLUCOSE, UA: NEGATIVE mg/dL
Ketones, ur: NEGATIVE mg/dL
Nitrite: POSITIVE — AB
PROTEIN: NEGATIVE mg/dL
Specific Gravity, Urine: 1.014 (ref 1.005–1.030)
Urobilinogen, UA: 1 mg/dL (ref 0.0–1.0)
pH: 7 (ref 5.0–8.0)

## 2015-07-04 LAB — BASIC METABOLIC PANEL
Anion gap: 9 (ref 5–15)
BUN: 16 mg/dL (ref 6–20)
CALCIUM: 9 mg/dL (ref 8.9–10.3)
CO2: 28 mmol/L (ref 22–32)
CREATININE: 0.93 mg/dL (ref 0.61–1.24)
Chloride: 102 mmol/L (ref 101–111)
GFR calc Af Amer: >60 mL/min (ref >60)
Glucose, Bld: 106 mg/dL — ABNORMAL HIGH (ref 65–99)
POTASSIUM: 4.3 mmol/L (ref 3.5–5.1)
SODIUM: 139 mmol/L (ref 135–145)

## 2015-07-04 LAB — CBC WITH DIFFERENTIAL/PLATELET
BASOS ABS: 0 10*3/uL (ref 0.0–0.1)
BASOS PCT: 1 % (ref 0–1)
EOS ABS: 0.3 10*3/uL (ref 0.0–0.7)
EOS PCT: 4 % (ref 0–5)
HCT: 47.9 % (ref 39.0–52.0)
Hemoglobin: 16.3 g/dL (ref 13.0–17.0)
Lymphocytes Relative: 13 % (ref 12–46)
Lymphs Abs: 1.1 10*3/uL (ref 0.7–4.0)
MCH: 32 pg (ref 26.0–34.0)
MCHC: 34 g/dL (ref 30.0–36.0)
MCV: 93.9 fL (ref 78.0–100.0)
Monocytes Absolute: 1 10*3/uL (ref 0.1–1.0)
Monocytes Relative: 11 % (ref 3–12)
Neutro Abs: 6.3 10*3/uL (ref 1.7–7.7)
Neutrophils Relative %: 71 % (ref 43–77)
PLATELETS: 172 10*3/uL (ref 150–400)
RBC: 5.1 MIL/uL (ref 4.22–5.81)
RDW: 13.7 % (ref 11.5–15.5)
WBC: 8.8 10*3/uL (ref 4.0–10.5)

## 2015-07-04 LAB — HEPATIC FUNCTION PANEL
ALBUMIN: 3.7 g/dL (ref 3.5–5.0)
ALK PHOS: 62 U/L (ref 38–126)
ALT: 8 U/L — ABNORMAL LOW (ref 17–63)
AST: 37 U/L (ref 15–41)
BILIRUBIN DIRECT: 0.3 mg/dL (ref 0.1–0.5)
BILIRUBIN INDIRECT: 0.7 mg/dL (ref 0.3–0.9)
BILIRUBIN TOTAL: 1 mg/dL (ref 0.3–1.2)
Total Protein: 6.6 g/dL (ref 6.5–8.1)

## 2015-07-04 LAB — URINE MICROSCOPIC-ADD ON

## 2015-07-04 MED ORDER — HALOPERIDOL 2 MG PO TABS: 2.0000 mg | ORAL_TABLET | Freq: Three times a day (TID) | ORAL | Status: AC | PRN

## 2015-07-04 MED ORDER — SODIUM CHLORIDE 0.9 % IV SOLN
INTRAVENOUS | Status: DC
  Administered 2015-07-04: 02:00:00 via INTRAVENOUS

## 2015-07-04 MED ORDER — DIAZEPAM 5 MG/ML IJ SOLN
2.5000 mg | Freq: Once | INTRAMUSCULAR | Status: AC
  Administered 2015-07-04: 2.5 mg via INTRAVENOUS
  Filled 2015-07-04: qty 2

## 2015-07-04 MED ORDER — CARBIDOPA-LEVODOPA 25-100 MG PO TABS
1.0000 | ORAL_TABLET | Freq: Three times a day (TID) | ORAL | Status: DC
  Administered 2015-07-04: 1 via ORAL
  Filled 2015-07-04: qty 1

## 2015-07-04 MED ORDER — HALOPERIDOL LACTATE 5 MG/ML IJ SOLN
2.0000 mg | Freq: Once | INTRAMUSCULAR | Status: AC
  Administered 2015-07-04: 2 mg via INTRAVENOUS
  Filled 2015-07-04: qty 1

## 2015-07-04 MED ORDER — BACLOFEN 20 MG PO TABS
20.0000 mg | ORAL_TABLET | Freq: Three times a day (TID) | ORAL | Status: DC
Start: 2015-07-04 — End: 2015-09-09

## 2015-07-04 MED ORDER — BACLOFEN 20 MG PO TABS
20.0000 mg | ORAL_TABLET | Freq: Once | ORAL | Status: AC
  Administered 2015-07-04: 20 mg via ORAL
  Filled 2015-07-04: qty 1

## 2015-07-04 MED ORDER — HALOPERIDOL LACTATE 5 MG/ML IJ SOLN
2.0000 mg | Freq: Once | INTRAMUSCULAR | Status: AC
  Administered 2015-07-04: 2 mg via INTRAMUSCULAR
  Filled 2015-07-04: qty 1

## 2015-07-04 NOTE — Discharge Instructions (Signed)
Atrial Fibrillation Atrial fibrillation is a condition that causes your heart to beat irregularly. It may also cause your heart to beat faster than normal. Atrial fibrillation can prevent your heart from pumping blood normally. It increases your risk of stroke and heart problems. HOME CARE  Take medications as told by your doctor.  Only take medications that your doctor says are safe. Some medications can make the condition worse or happen again.  If blood thinners were prescribed by your doctor, take them exactly as told. Too much can cause bleeding. Too little and you will not have the needed protection against stroke and other problems.  Perform blood tests at home if told by your doctor.  Perform blood tests exactly as told by your doctor.  Do not drink alcohol.  Do not drink beverages with caffeine such as coffee, soda, and some teas.  Maintain a healthy weight.  Do not use diet pills unless your doctor says they are safe. They may make heart problems worse.  Follow diet instructions as told by your doctor.  Exercise regularly as told by your doctor.  Keep all follow-up appointments. GET HELP IF:  You notice a change in the speed, rhythm, or strength of your heartbeat.  You suddenly begin peeing (urinating) more often.  You get tired more easily when moving or exercising. GET HELP RIGHT AWAY IF:   You have chest or belly (abdominal) pain.  You feel sick to your stomach (nauseous).  You are short of breath.  You suddenly have swollen feet and ankles.  You feel dizzy.  You face, arms, or legs feel numb or weak.  There is a change in your vision or speech. MAKE SURE YOU:   Understand these instructions.  Will watch your condition.  Will get help right away if you are not doing well or get worse. Document Released: 08/03/2008 Document Revised: 03/11/2014 Document Reviewed: 12/05/2012 [facility] Patient Information 2015 [place], [place]. This information is not  intended to replace advice given to you by your health care provider. Make sure you discuss any questions you have with your health care provider.  Hiccups A hiccup is the result of a sudden shortening of the muscle below your lungs (diaphragm). This movement of your diaphragm is then followed by the closing of your vocal cords, which causes the hiccup sound. Most people get the hiccups. Typically, hiccups last only a short amount of time. There are three types of hiccups:   Benign: last less than 48 hours.  Persistent: last more than 48 hours, but less than 1 month.  Intractable: last more than 1 month. A hiccup is a reflex. You cannot control reflexes. CAUSES  Causes of the hiccups can include:   Eating too much.  Drinking too much alcohol or fizzy drinks.  Eating too fast.  Eating or drinking hot and spicy foods or drinks.  Using certain medicines that have hiccupping as a side effect. Several medical conditions may also cause hiccups, including, but not limited to:  Stroke.  Gastroesophageal reflux.  Multiple sclerosis.  Traumatic brain injury.  Brain tumor.  Meningitis.  Having damage to the nerve that affects the diaphragm. Usually, though, hiccups have no apparent cause and are not the result of a serious medical condition. DIAGNOSIS  Tests may be performed to diagnose a possible condition associated with persistent or intractable hiccups. TREATMENT  Most cases of the hiccups need no treatment. None of the numerous home remedies have been proven to be effective. If your hiccups  do require treatment, your treatment may include:  Medicine. Medicine may be given intravenously (by IV) or by mouth.  Hypnosis or acupuncture.  Surgery to the nerve that affects the diaphragm may be tried in severe cases. If your hiccups are caused by an underlying medical condition, treatment for the medical condition may be necessary.  HOME CARE INSTRUCTIONS   Eat small  meals.  Limit alcohol intake to no more than 1 drink per day for nonpregnant women and 2 drinks per day for men. One drink equals 12 ounces of beer, 5 ounces of wine, or 1 ounces of hard liquor.  Limit drinking fizzy drinks.  Eat and chew your food slowly.  Take medicines only as directed by your health care provider. SEEK MEDICAL CARE IF:   Your hiccups last for more than 48 hours.  You are given medicine, but your hiccups do not get better.  You cannot sleep or eat due to the hiccups.  You have unexpected weight loss due to the hiccups.  You have trouble breathing or swallowing.  You have a fever.  You develop severe pain in your abdomen.  You develop numbness, tingling, or weakness. Document Released: 01/03/2002 Document Revised: 03/11/2014 Document Reviewed: 12/16/2010 [facility] Patient Information 2015 [place], [place]. This information is not intended to replace advice given to you by your health care provider. Make sure you discuss any questions you have with your health care provider.

## 2015-07-04 NOTE — ED Notes (Signed)
MD at bedside. 

## 2015-07-04 NOTE — ED Notes (Signed)
Pt escorted home via PTAR. Wife given discharge instructions and RX.

## 2015-07-07 ENCOUNTER — Telehealth: Payer: Self-pay | Admitting: *Deleted

## 2015-07-07 NOTE — Telephone Encounter (Signed)
Left msg on triage stating Dr. Jenny Reichmann has been rx alprazolam 0.5 mg. Want to inform md that Dr. Asencion Partridge Dohmeier also rx Clonazepam 1 mg # 225 for 3 months. Does md want to continue rx alprazolam.../lmb

## 2015-07-08 NOTE — Telephone Encounter (Signed)
Notified Kristen with md response...[name] [name]

## 2015-07-08 NOTE — Telephone Encounter (Signed)
Ok to d/c xanax I have taken off med list

## 2015-07-09 ENCOUNTER — Telehealth: Payer: Self-pay | Admitting: Internal Medicine

## 2015-07-09 NOTE — Telephone Encounter (Signed)
Patient wife ask if her husband can be referred to [place], please advise

## 2015-07-10 NOTE — Telephone Encounter (Signed)
Left message on machine for pt to return my call  

## 2015-07-10 NOTE — Telephone Encounter (Signed)
Left message on machine for pt's spouse to return my call and advise on type of care requested

## 2015-07-11 ENCOUNTER — Telehealth: Payer: Self-pay | Admitting: Internal Medicine

## 2015-07-11 ENCOUNTER — Other Ambulatory Visit: Payer: Self-pay | Admitting: Internal Medicine

## 2015-07-11 NOTE — Telephone Encounter (Signed)
Ok to order UA/ Ucx?

## 2015-07-11 NOTE — Telephone Encounter (Signed)
Best for OV, any MD ok

## 2015-07-11 NOTE — Telephone Encounter (Signed)
Pt wife called in said that pt has blood in his urine and would like to know if he can put an order in to get labs done today?  Michela Pitcher that it is hard to get him out of the house.

## 2015-07-11 NOTE — Telephone Encounter (Signed)
Per Dr. Jenny Reichmann, pt needs an office visit for evaluation, please advise spouse of same. Thanks

## 2015-07-17 ENCOUNTER — Ambulatory Visit (INDEPENDENT_AMBULATORY_CARE_PROVIDER_SITE_OTHER): Payer: Medicare Other | Admitting: Cardiology

## 2015-07-17 ENCOUNTER — Other Ambulatory Visit: Payer: Self-pay | Admitting: Internal Medicine

## 2015-07-17 ENCOUNTER — Encounter: Payer: Self-pay | Admitting: Cardiology

## 2015-07-17 ENCOUNTER — Ambulatory Visit: Payer: Medicare Other | Admitting: Internal Medicine

## 2015-07-17 VITALS — BP 106/58 | HR 72 | Ht 69.0 in

## 2015-07-17 DIAGNOSIS — T8351XA Infection and inflammatory reaction due to indwelling urinary catheter, initial encounter: Secondary | ICD-10-CM | POA: Diagnosis not present

## 2015-07-17 DIAGNOSIS — T83511A Infection and inflammatory reaction due to indwelling urethral catheter, initial encounter: Secondary | ICD-10-CM

## 2015-07-17 DIAGNOSIS — N390 Urinary tract infection, site not specified: Secondary | ICD-10-CM

## 2015-07-17 DIAGNOSIS — I480 Paroxysmal atrial fibrillation: Secondary | ICD-10-CM

## 2015-07-17 NOTE — Patient Instructions (Addendum)
Your physician wants you to follow-up in: 1 Year. You will receive a reminder letter in the mail two months in advance. If you don't receive a letter, please call our office to schedule the follow-up appointment.'  Your physician recommends that you return for lab work today Urinalysis

## 2015-07-17 NOTE — Progress Notes (Signed)
HPI The patient presents for evaluation of CAD.  He has had previous bypass. He has had atrial fibrillation with previous cardioversions. He's not had any symptomatic paroxysms although he was noted to be in fibrillation when he was in the emergency room recently with pickups.[name] [name] He is very limited with Parkinson's disease. He denies any chest pressure, neck or arm discomfort. He's not having any palpitations, presyncope or syncope. As opposed to previously he would not notice that he is in fibrillation. He tolerates anticoagulation.   Allergies  Allergen Reactions  . Lisinopril Swelling  . Statins     parkinsons   . Thorazine [Chlorpromazine]     Confusion/loopy    Current Outpatient Prescriptions  Medication Sig Dispense Refill  . ALPRAZolam (XANAX) 0.5 MG tablet Take 0.5 mg by mouth as needed.  5  . amLODipine (NORVASC) 5 MG tablet Take 1 tablet (5 mg total) by mouth daily. 90 tablet 3  . baclofen (LIORESAL) 20 MG tablet Take 1 tablet (20 mg total) by mouth 3 (three) times daily. Prn hiccups 30 each 0  . baclofen (LIORESAL) 20 MG tablet Take 20 mg by mouth 3 (three) times daily as needed for muscle spasms.    [name] [name] buPROPion (WELLBUTRIN XL) 150 MG 24 hr tablet TAKE 1 TABLET DAILY 90 tablet 3  . clonazePAM (KLONOPIN) 1 MG tablet Take 2 tablets (2 mg total) by mouth at bedtime. Can take 1/2 tablet more, if having difficulty sleeping. 225 tablet 1  . Coenzyme Q10 (CO Q 10 PO) Take 1 tablet by mouth at bedtime.    [name] [name] desloratadine (CLARINEX) 5 MG tablet Take 1 tablet (5 mg total) by mouth daily. 90 tablet 3  . docusate sodium (COLACE) 100 MG capsule Take 300 mg by mouth at bedtime.     . donepezil (ARICEPT) 23 MG TABS tablet TAKE 1 TABLET EVERY MORNING 90 tablet 1  . ELIQUIS 5 MG TABS tablet TAKE 1 TABLET TWICE A DAY 180 tablet 3  . Fluticasone-Salmeterol (ADVAIR) 250-50 MCG/DOSE AEPB Inhale 1 puff into the lungs every 12 (twelve) hours. As needed    . haloperidol (HALDOL) 2 MG tablet Take 1  tablet (2 mg total) by mouth every 8 (eight) hours as needed (hiccups). 10 tablet 0  . ibuprofen (ADVIL,MOTRIN) 200 MG tablet Take 400 mg by mouth every 6 (six) hours as needed for pain.    [name] [name] levocetirizine (XYZAL) 5 MG tablet Take 1 tablet (5 mg total) by mouth every evening. 90 tablet 3  . primidone (MYSOLINE) 50 MG tablet TAKE 1 TABLET TWICE A DAY  AS DIRECTED 180 tablet 1  . PROAIR HFA 108 (90 BASE) MCG/ACT inhaler USE 2 INHALATIONS ORALLY   EVERY 4 HOURS AS NEEDED FORWHEEZING 25.5 g 3  . RYTARY 36.25-145 MG CPCR TAKE ONE CAPSULE BY MOUTH 3 TIMES A DAY WITH MEALS 90 capsule 3  . solifenacin (VESICARE) 5 MG tablet Take 1 tablet (5 mg total) by mouth daily. 90 tablet 3  . triamcinolone cream (KENALOG) 0.1 % Apply 1 application topically 2 (two) times daily. 30 g 0  . VERAMYST 27.5 MCG/SPRAY nasal spray USE 1 SPRAY NASALLY TWICE  DAILY 10 g 2   No current facility-administered medications for this visit.    Past Medical History  Diagnosis Date  . ONYCHOMYCOSIS, TOENAILS 06/27/2008  . HYPERLIPIDEMIA 07/28/2007  . ANXIETY 09/28/2007  . OBSTRUCTIVE SLEEP APNEA 01/01/2008  . RESTLESS LEGS SYNDROME 12/26/2007  . HEARING LOSS 06/27/2008  . HYPERTENSION 07/28/2007  .  MYOCARDIAL INFARCTION, HX OF 07/28/2007  . CORONARY ARTERY DISEASE 07/28/2007  . Atrial fibrillation 09/28/2007  . SINUSITIS- ACUTE-NOS 10/10/2007  . ALLERGIC RHINITIS 09/28/2007  . CHOLELITHIASIS 09/28/2007  . BENIGN PROSTATIC HYPERTROPHY 09/28/2007  . CELLULITIS, HAND, LEFT 09/24/2010  . SYNCOPE 07/28/2007  . INSOMNIA-SLEEP DISORDER-UNSPEC 04/03/2009  . HYPERSOMNIA WITH SLEEP APNEA UNSPECIFIED 12/26/2007  . FATIGUE 01/01/2008  . DYSPNEA 04/03/2009  . Wheezing 11/06/2009  . ANGIOEDEMA 11/06/2009  . COLON CANCER, HX OF 07/28/2007  . PROSTATE CANCER, HX OF 07/28/2007  . COLONIC POLYPS, HX OF 07/28/2007  . Long term current use of anticoagulant 12/22/2010  . Asthma   . DEMENTIA   . Spinal stenosis   . History of atrial fibrillation     . Lumbar stenosis 10/30/2011  . CAD (coronary artery disease)     Severe three-vessel coronary disease. LIMA to the LAD patent. SVG to PDA patent. SVG to OM 3 patent. Preserved ejection fraction. 2008.  . Cancer   . Thyroid disease   . Arthritis   . Pneumonia   . 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 Sepsis with acute organ dysfunction 09/21/2011  . 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 Cervical disc disorder with radiculopathy of cervical region 04/15/2014  . 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 Prostatectomy  2001  . Cardioversion  2007  . Stress cardiolite  02/08/2007  . Schocardiograph  10/07/2006  . Right thyroid lobectomy    . Coronary artery bypass graft  1995    x 3  . Hernia repair  1998  . Colonoscopy  2010    last done 2010- clear  . Cardiac electrophysiology study and ablation    . Thyroidectomy, partial      ROS: As stated in the HPI and negative for all other systems.  PHYSICAL EXAM BP 106/58 mmHg  Pulse 72  Ht 5\' 9"  (1.753 m)  Wt  GENERAL:  Chronically ill appearing. NECK:  No jugular venous distention at 90 degrees, waveform within normal limits, carotid upstroke brisk and symmetric, no bruits, no thyromegaly LYMPHATICS:  No cervical, inguinal adenopathy LUNGS:  Bilateral basilar crackles BACK:  No CVA tenderness CHEST:  Well healed sternotomy scar. HEART:  S1 and S2 within normal limits, no S3, no S4, no clicks, no rubs, no murmurs ABD:  Flat, positive bowel sounds normal in frequency in pitch, no bruits, no rebound, patient seated EXT:  2 plus pulses throughout, no edema, no cyanosis no clubbing, muscle wasting SKIN:  No rashes no nodules NEURO:  Cranial nerves II through XII grossly intact, motor grossly intact throughout, slow to respond PSYCH:   Cognitively intact, oriented to person place and time, baseline tremor   ASSESSMENT AND PLAN  ATRIAL FIBRILLATION  He is not noticing his fibrillation. He's tolerating anticoagulation. At this point I don't think there would be any advantage to further cardioversion. I discussed this at length with the patient and his wife.  CABG The patient has no new sypmtoms since stress testing in 2013.  No further cardiovascular testing is indicated.  We will continue with aggressive risk reduction and meds as listed.   HEMATURIA He is not having any fevers and chills but has had intermittent blood in his urine that looked like he had a urinary infection when he was in the emergency  room at the end of August. He finds it very difficult to travel. I will go ahead and draw a UA and culture and send these results to Dr. Jenny Reichmann. I sent a message to Dr. Jenny Reichmann.

## 2015-07-18 LAB — URINALYSIS
Bilirubin Urine: NEGATIVE
GLUCOSE, UA: NEGATIVE
Hgb urine dipstick: NEGATIVE
Ketones, ur: NEGATIVE
Nitrite: NEGATIVE
PH: 5.5 (ref 5.0–8.0)
Protein, ur: NEGATIVE
Specific Gravity, Urine: 1.021 (ref 1.001–1.035)

## 2015-08-07 ENCOUNTER — Encounter: Payer: Self-pay | Admitting: Internal Medicine

## 2015-08-13 ENCOUNTER — Other Ambulatory Visit: Payer: Self-pay | Admitting: Neurology

## 2015-08-15 ENCOUNTER — Other Ambulatory Visit: Payer: Self-pay

## 2015-08-15 MED ORDER — FLUTICASONE-SALMETEROL 250-50 MCG/DOSE IN AEPB: 1.0000 | INHALATION_SPRAY | Freq: Two times a day (BID) | RESPIRATORY_TRACT | Status: DC

## 2015-09-03 NOTE — Telephone Encounter (Signed)
Error

## 2015-09-04 ENCOUNTER — Ambulatory Visit: Payer: Medicare Other | Admitting: Neurology

## 2015-09-08 ENCOUNTER — Telehealth: Payer: Self-pay | Admitting: Neurology

## 2015-09-08 NOTE — Telephone Encounter (Signed)
Patient's wife is calling and would like to know if her husband can get a flu shot tomorrow when he comes in to his appointment.  She also wants to know if she will give him a prognosis.  Thanks!

## 2015-09-08 NOTE — Telephone Encounter (Signed)
I advised pt's wife that we do not give flu shots at this office. She would need to see pt's PCP or go to any pharmacy that offers flu shots. I also advised pt's wife that Dr. Brett Fairy will do her best to give a prognosis for the pt tomorrow at his office visit. Pt's wife verbalized understanding.

## 2015-09-09 ENCOUNTER — Encounter: Payer: Self-pay | Admitting: Neurology

## 2015-09-09 ENCOUNTER — Ambulatory Visit (INDEPENDENT_AMBULATORY_CARE_PROVIDER_SITE_OTHER): Payer: Medicare Other | Admitting: Neurology

## 2015-09-09 VITALS — BP 118/78 | HR 76 | Resp 20

## 2015-09-09 DIAGNOSIS — G3183 Dementia with Lewy bodies: Secondary | ICD-10-CM | POA: Diagnosis not present

## 2015-09-09 DIAGNOSIS — R251 Tremor, unspecified: Secondary | ICD-10-CM

## 2015-09-09 DIAGNOSIS — F0280 Dementia in other diseases classified elsewhere without behavioral disturbance: Secondary | ICD-10-CM

## 2015-09-09 DIAGNOSIS — G20 Parkinson's disease: Secondary | ICD-10-CM | POA: Insufficient documentation

## 2015-09-09 DIAGNOSIS — F0281 Dementia in other diseases classified elsewhere with behavioral disturbance: Secondary | ICD-10-CM | POA: Diagnosis not present

## 2015-09-09 DIAGNOSIS — G232 Striatonigral degeneration: Secondary | ICD-10-CM

## 2015-09-09 MED ORDER — CARBIDOPA-LEVODOPA ER 36.25-145 MG PO CPCR: 36.5000 mg | ORAL_CAPSULE | Freq: Three times a day (TID) | ORAL | Status: DC

## 2015-09-09 MED ORDER — DONEPEZIL HCL 23 MG PO TABS: 23.0000 mg | ORAL_TABLET | Freq: Every morning | ORAL | Status: AC

## 2015-09-09 MED ORDER — PRIMIDONE 50 MG PO TABS: 50.0000 mg | ORAL_TABLET | Freq: Two times a day (BID) | ORAL | Status: DC

## 2015-09-09 NOTE — Patient Instructions (Signed)
Rv in 2-3 month with NP.  Hospice  Referral placed. Respite care discussed.

## 2015-09-09 NOTE — Progress Notes (Signed)
Subjective:    Patient ID: [name] [name] is a 80 y.o. male.  HPI:  Diabetes here on 6-20 2-16 for a revisit. His last visit in our office was was [name] Givens, NP. Here for his CPAP compliance report. The download dated 6-20 1-16 shows 93% compliance for days of use and over 4 hours of nightly use. The average CPAP use is 8 hours and 6 minutes the machine is set at 11 cm water with 3 cm EPR and his apnea index is 3.8. There is no need to adjust the machine based on those results. Mrs. [name] [name] advised me that in spite of having help at the home with healthcare she is unable now to use the [name] lift which is a manually operated left for her husband he usually operates in a wheelchair and only transfers from bed to bathroom he is no longer ambulatory in other ways. She requests to be evaluated for a motorized [name]. In addition I have today to review a swallowing test from 02-18-15 the patient had a coughing spell when the barium tablet Stuck in the vallecula and he may have slightly aspirated at the time.  He was able however to take thin liquids, nectar thick liquids primary and a previous with a cracker. He has problems to swallow granular foods such as peas, rice,  corns, crumbs.. The also evaluated his memory concerns today again he was able to score in a Mini-Mental status examination 20 out of 29 points today he is unable to draw due to his illness but he had also trouble with spelling a word backwards and do a serial sevens. He thought it was Tuesday  (today is Wednesday) and the year  as 1900 , than as 2000. He has more comprehension difficulties . He has muc more tremor, treated partially with primidone an with sinemet.  He was in [place] for 12 Days with his wife, he even managed to take a water taxi through [place].  He has a lot of freezing of his steps. Had multiple falls, will trip over his feet. With the walker can walk up only to 2-5 feet.   Parkinson plus syndrome, Gait instability - FACE  TO FACE ; he needs a power wheelchair for mobility assistance Legs give out.  Denies any loss of sense of smell. CPAP user, but CPAP broke.   Interval history from 09-09-15  The patient has done very well on rytary the  tremor is controlled . In the meantime he has lost bladder control, his wife has noted that he has more and more difficulties to move his right arm. In the left arm with frozen due to arthritis... He does have dry mouth and dry eyes often his mouth is open. He has a rare blink. All this fits his Parkinson's disease he has dysarthria and by now has some apraxia he has only learned the instinct of swallowing. The right tarry pills also has the benefit of being smaller and easier to be taken by mouth for him. He has lost control of his right arm, he does not control his feet or legs very well and he needs full assist when transferring or just turning in bed his wife also can no longer assist him alone due to his body weight. He uses thick liquids honey thickened liquids to avoid choking. Mrs. [name] [name] suffers from a progressive neurodegenerative disease and has the character of multisystem atrophy/  asymmetric parkinsonism, with dysautonomic features as well. He also has some  memory loss and cognitive deficits. In 12 month he had 4 pneumonias and 3 UTI s.  His wife is putting together 24 hour care with the help of LTC insurance. She may involve hospice. This would be an assistance with medication and which the intake of medication. Hospice usually also handles pain medication for patients with fatal illness. His wife noticed that she needs help to put his 16+ medications each day into the right pillbox. We will have we will change as many as possible to 90 day supplies. I expect that the patient's feet are swollen because he is not actively walking and doesn't have the muscle pump to put fluid back into his system. Home hospice could help with a visit every week. He also needs transport aide ,he  cannot sit in a normal personal car anymore,  it is too difficult to get in and out.  [name] [name]'s medical transport will transfer the patient for feet. He is not longer able to follow PT.    History per prior notes;  [name] Like, DO  [name] [name] is a very pleasant 80 yo RH gentleman . He is accompanied by his wife today. He has an underlying complex history including parkinsonism, dementia, anxiety, OSA, restless leg syndrome, hearing loss, hypertension, heart disease with status post MI and status post CABG, atrial fibrillation, history of syncope, insomnia, history of colon cancer, asthma, spinal stenosis, thyroid disease, arthritis, and has had recurrent falls. He started seeing [name]. Brett [name] I believe in 2011 and was most recently seen by our nurse practitioner in February 2014, at which time he was continued on Sinemet 3 times a day and advised to reduce prednisone. His wife called today stating that to him he needed to be seen because of recurrent falls. His appointment was canceled for last week and he was not yet rescheduled for another appointment. He was seen in the emergency room on 05/15/2013 after a fall. He had fallen backwards hitting his head on concrete while trying to get into his car. His wife also reported increasing confusion over the past for 5 days prior to your ED visit. He had a CT head which showed stable atrophy and white matter changes and no acute abnormality. He had chest x-ray which was negative for pneumonia. Laboratory function showed normal electrolytes, normal CBC, normal urinalysis. His wife states that is legs give out and he has a tendency to fall backwards. He has had PT before and last went in November. PT has helped in the past. She would [name] a referral for PT again. He went to PT and speech rehab on [facility] [phone]).   In summary, [name] [name] is a very pleasant 80 y.o.-year old male with a complicated medical Hx who has left-sided predominant Parkinson's disease, or  MSA . memory loss, gait disorder, likely multifactorial in nature.  [name] [name] [name] had increased his SInemet 25-100 to 1.5 tablets 4x a day. This helped him to move better, but did not affect the dysautonomia.  He seems to have declined further, walks only 20 steps. He needs a Power wheelchair. Needs a [employer] fast streamer.   Follow up with my NP or me, [name] [name],  in 4 months.  Consider change to due dopa or Rytary .  He has frequent UTI's and Ihe feels unsure about a feeding tube placement for Duodopa. He could potentially benefit form droxidopa.        His Past Medical History Is Significant For: Past Medical History  Diagnosis  Date  . ONYCHOMYCOSIS, TOENAILS 06/27/2008  . HYPERLIPIDEMIA 07/28/2007  . ANXIETY 09/28/2007  . OBSTRUCTIVE SLEEP APNEA 01/01/2008  . RESTLESS LEGS SYNDROME 12/26/2007  . HEARING LOSS 06/27/2008  . HYPERTENSION 07/28/2007  . MYOCARDIAL INFARCTION, HX OF 07/28/2007  . CORONARY ARTERY DISEASE 07/28/2007  . Atrial fibrillation ([place]) 09/28/2007  . SINUSITIS- ACUTE-NOS 10/10/2007  . ALLERGIC RHINITIS 09/28/2007  . CHOLELITHIASIS 09/28/2007  . BENIGN PROSTATIC HYPERTROPHY 09/28/2007  . CELLULITIS, HAND, LEFT 09/24/2010  . SYNCOPE 07/28/2007  . INSOMNIA-SLEEP DISORDER-UNSPEC 04/03/2009  . HYPERSOMNIA WITH SLEEP APNEA UNSPECIFIED 12/26/2007  . FATIGUE 01/01/2008  . DYSPNEA 04/03/2009  . Wheezing 11/06/2009  . ANGIOEDEMA 11/06/2009  . COLON CANCER, HX OF 07/28/2007  . PROSTATE CANCER, HX OF 07/28/2007  . COLONIC POLYPS, HX OF 07/28/2007  . Long term current use of anticoagulant 12/22/2010  . Asthma   . DEMENTIA   . Spinal stenosis   . History of atrial fibrillation   . Lumbar stenosis 10/30/2011  . CAD (coronary artery disease)     Severe three-vessel coronary disease. LIMA to the LAD patent. SVG to PDA patent. SVG to OM 3 patent. Preserved ejection fraction. 2008.  . Cancer ([place])   . Thyroid disease   . Arthritis   . Pneumonia   . 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 Sepsis with acute organ dysfunction ([place]) 09/21/2011  . Parkinson's variant of multiple system atrophy ([place]) 02/27/2014    Per [name] Dohmeir/neurology  . Cervical disc disorder with radiculopathy of cervical region 04/15/2014  . Multiple system atrophy with bradykinesia ([place]) 02/28/2014  . Multiple system atrophy, Parkinson variant ([place]) 10/01/2014    His Past Surgical History Is Significant For: Past Surgical History  Procedure Laterality Date  . Prostatectomy  2001  . Cardioversion  2007  . Stress cardiolite  02/08/2007  . Schocardiograph  10/07/2006  . Right thyroid lobectomy    . Coronary artery bypass graft  1995    x 3  . Hernia repair  1998  . Colonoscopy  2010    last done 2010- clear  . Cardiac electrophysiology study and ablation    . Thyroidectomy, partial      His Family History Is Significant For: Family History  Problem Relation Age of Onset  . Coronary artery disease Father     before 64 yo  . Coronary artery disease Sister     before 34 yo  . Hypertension Other     His Social History Is Significant For: Social History   Social History  . Marital Status: Married    Spouse Name: Diane  . Number of Children: 2  . Years of Education: 14   Occupational History  . retired Health visitor    Social History Main Topics  . Smoking status: Never Smoker   . Smokeless tobacco: Never Used  . Alcohol Use: No  . Drug Use: No  . Sexual Activity: Not Asked   Other Topics Concern  . None   Social History Narrative   Patient is married (Diane) and lives at home with his wife.   Patient has 7 children.   Patient is retired.   Patient has a Associates degree.   Patient is right-handed.   Patient does not drink any caffeine.    His Allergies Are:  Allergies  Allergen Reactions  . Lisinopril Swelling  . Statins     parkinsons   . Thorazine [Chlorpromazine]     Confusion/loopy  :  His Current Medications Are:   Outpatient Encounter Prescriptions as of 09/09/2015  Medication Sig  . ALPRAZolam (XANAX) 0.5 MG tablet Take 0.5 mg by mouth as needed.  Marland Kitchen amLODipine (NORVASC) 5 MG tablet Take 1 tablet (5 mg total) by mouth daily.  Marland Kitchen buPROPion (WELLBUTRIN XL) 150 MG 24 hr tablet TAKE 1 TABLET DAILY  . Carbidopa-Levodopa ER (RYTARY) 36.25-145 MG CPCR Place 36.5 mg inside cheek 4 (four) times daily - after meals and at bedtime.  . clonazePAM (KLONOPIN) 1 MG tablet Take 2 tablets (2 mg total) by mouth at bedtime. Can take 1/2 tablet more, if having difficulty sleeping.  . desloratadine (CLARINEX) 5 MG tablet TAKE 1 TABLET DAILY  . docusate sodium (COLACE) 100 MG capsule Take 300 mg by mouth at bedtime.   . donepezil (ARICEPT) 23 MG TABS tablet Take 1 tablet (23 mg total) by mouth every morning.  Marland Kitchen ELIQUIS 5 MG TABS tablet TAKE 1 TABLET TWICE A DAY  . Fluticasone-Salmeterol (ADVAIR) 250-50 MCG/DOSE AEPB Inhale 1 puff into the lungs every 12 (twelve) hours. As needed  . haloperidol (HALDOL) 2 MG tablet Take 1 tablet (2 mg total) by mouth every 8 (eight) hours as needed (hiccups).  Marland Kitchen ibuprofen (ADVIL,MOTRIN) 200 MG tablet Take 400 mg by mouth every 6 (six) hours as needed for pain.  Marland Kitchen levocetirizine (XYZAL) 5 MG tablet Take 1 tablet (5 mg total) by mouth every evening.  . primidone (MYSOLINE) 50 MG tablet Take 1 tablet (50 mg total) by mouth 2 (two) times daily before a meal.  . PROAIR HFA 108 (90 BASE) MCG/ACT inhaler USE 2 INHALATIONS ORALLY   EVERY 4 HOURS AS NEEDED FORWHEEZING  . solifenacin (VESICARE) 5 MG tablet Take 1 tablet (5 mg total) by mouth daily.  Marland Kitchen triamcinolone cream (KENALOG) 0.1 % Apply 1 application topically 2 (two) times daily.  . VERAMYST 27.5 MCG/SPRAY nasal spray USE 1 SPRAY NASALLY TWICE  DAILY  . [DISCONTINUED] donepezil (ARICEPT) 23 MG TABS tablet TAKE 1 TABLET EVERY MORNING  . [DISCONTINUED] primidone (MYSOLINE) 50 MG tablet TAKE 1 TABLET 2 TIMES DAILYAS DIRECTED  . [DISCONTINUED] RYTARY  36.25-145 MG CPCR TAKE ONE CAPSULE BY MOUTH 3 TIMES A DAY WITH MEALS  . [DISCONTINUED] baclofen (LIORESAL) 20 MG tablet Take 1 tablet (20 mg total) by mouth 3 (three) times daily. Prn hiccups (Patient not taking: Reported on 09/09/2015)  . [DISCONTINUED] baclofen (LIORESAL) 20 MG tablet Take 20 mg by mouth 3 (three) times daily as needed for muscle spasms.  . [DISCONTINUED] Coenzyme Q10 (CO Q 10 PO) Take 1 tablet by mouth at bedtime.  . [DISCONTINUED] primidone (MYSOLINE) 50 MG tablet TAKE 1 TABLET TWICE A DAY  AS DIRECTED   No facility-administered encounter medications on file as of 09/09/2015.   Review of Systems  Constitutional: Positive for fatigue.  HENT: Positive for hearing loss, rhinorrhea, trouble swallowing and tinnitus.   Respiratory: Positive for shortness of breath and wheezing.        Uses a CPAP  Gastrointestinal: Positive for constipation.  Musculoskeletal: Positive for myalgias and arthralgias.  Allergic/Immunologic: Positive for environmental allergies.  Neurological: Positive for tremors.       Memory loss Restless leg  Hematological: Bruises/bleeds easily.  Psychiatric/Behavioral: Positive for confusion, sleep disturbance and dysphoric mood. The patient alert .     Objective:  Neurologic Exam  Physical Exam Physical Examination:   Filed Vitals:   09/09/15 1508  BP: 118/78  Pulse: 76  Resp: 20   His repeat  sitting blood pressure was 121/67 with a pulse of 57 and standing blood pressure was 129/68 with a pulse of 60. He did not report any lightheadedness upon standing.  General Examination: The patient is a very pleasant 80 y.o. male in no acute distress.  HEENT: Normocephalic, atraumatic, pupils are equal, round and reactive to light and accommodation. Funduscopic exam is normal with sharp disc margins noted. Extraocular tracking shows mild saccadic breakdown without nystagmus noted. There is limitation to upper gaze. There is mild decrease in eye blink rate.  Hearing is intact. Tympanic membranes are clear bilaterally. Face is symmetric with mild facial masking and normal facial sensation.  There is no lip, neck or jaw tremor. Neck is moderately rigid with intact passive ROM. There are no carotid bruits on auscultation. Tongue protrudes centrally and palate elevates symmetrically.   There is no drooling.   Neurologically:  Mental status: The patient is awake and alert, paying good  attention. He is able to partially provide the history. Masked face.  His wife provides details of the history. His  MMSE was 20 out of 29,  There is a mild degree of bradyphrenia. Speech is hypophonic and dysarthria is noted. Mood is ' gloomy" .  Cranial nerves are as described above under HEENT exam. In addition, shoulder shrug is normal with equal shoulder height noted. Motor exam: Normal bulk, and strength for age is noted. There are no dyskinesias noted.   Tone is  rigid with strong biceps and wrist cogwheeling. There is overall severe  bradykinesia. There is no drift or rebound.  The patient has a resting tremor that is more dominant in the left than in the right hand but the amplitude has definitely increased since his last visit. There is also the occasional jerk in both arms. His feet standstill and he seems not to have any tremor  involving the lower extremity.   Reflexes are 3+ in the upper extremities and 1 plus patella / lower extremities.    Fine motor skills exam: Finger taps are impaired on both sides ,  Hand movements are impaired . RAP (rapid alternating patting) is severly  impaired bilaterally.  Foot taps are moderately impaired on the right and moderately impaired on the left.  Foot agility (in the form of heel stomping) is moderately impaired on both sides. Brief myoclonic jerks of bilateral UE noted.  Cerebellar testing shows dysmetria but not intention tremor on finger to nose testing. He has resting tremor on the left hand, pill rolling.   Sensory  exam is intact to light touch, pinprick, vibration, temperature sense and proprioception in the upper and lower extremities.   Gait, station and balance:  He requires assistance to stands up from the seated position from his wheelchair.Marland Kitchen Requires assistance totransfer, is dysphonic and trembling . Unsteady.  Stance is wide-based.  Tandem walk is not possible. Balance is now severely impaired, unable to do a toe or heel stance.      Assessment and Plan:   1) Parkinson plus syndrome, Gait instability - FACE TO FACE ; he needs a power wheelchair for mobility assistance. This visit was precipitated by the patient's frequent falls the last on right here in the office witnessed today. He will need a power wheelchair. He is able to use his hands to steer the device but I would not want him to have a manual wheelchair as he doesn't have the strength to propel himself forward. I'm also referring the patient for physical therapy evaluation  with a goal of measuring for the power wheelchair and to specify a design the height of the seat and any other specific patient's physical therapy.  3) OSA, currently untreated as CPAP machine broke.  He is followed by [facility] - medical necessity to be faxed to [facility] here. Fax  [phone]. CPAP , 11 CM water, FFM.

## 2015-09-10 ENCOUNTER — Telehealth: Payer: Self-pay | Admitting: Neurology

## 2015-09-10 ENCOUNTER — Other Ambulatory Visit: Payer: Self-pay

## 2015-09-10 DIAGNOSIS — R251 Tremor, unspecified: Secondary | ICD-10-CM

## 2015-09-10 DIAGNOSIS — G3183 Dementia with Lewy bodies: Secondary | ICD-10-CM

## 2015-09-10 DIAGNOSIS — G20 Parkinson's disease: Secondary | ICD-10-CM

## 2015-09-10 DIAGNOSIS — F0281 Dementia in other diseases classified elsewhere with behavioral disturbance: Secondary | ICD-10-CM

## 2015-09-10 MED ORDER — CARBIDOPA-LEVODOPA ER 36.25-145 MG PO CPCR: 1.0000 | ORAL_CAPSULE | Freq: Three times a day (TID) | ORAL | Status: DC

## 2015-09-10 NOTE — Telephone Encounter (Signed)
Spoke with patients wife regarding the referral to Hospice. Wanted to make sure she was ok using hospice of [place] as the location for the patients hospice care.

## 2015-09-15 ENCOUNTER — Telehealth: Payer: Self-pay | Admitting: Internal Medicine

## 2015-09-15 NOTE — Telephone Encounter (Signed)
Spoke to [place]. I asked her, if Dr. Brett Fairy decided not to be the attending, who would it be? She said they were actually going to contact pts PCP Dr. Cathlean Cower, and his cardiologist team to ask if they wanted to be attending first. I advised them Dr. Brett Fairy that is his neurologist, but I would ask her if she would agree to be the attending. Betsy verbalized understanding.

## 2015-09-15 NOTE — Telephone Encounter (Signed)
Usually PCP or their own attending.

## 2015-09-15 NOTE — Telephone Encounter (Signed)
Betsy from Hospice called stating pt has been referred and she is wondering if you will be the attending physician. She states the doctors there can take care of symptomatic care if you like Please call Betsy at [phone]

## 2015-09-15 NOTE — Telephone Encounter (Signed)
Betsy/[facility] [phone] called regarding whether or not Dr. Brett Fairy would agree to be Attending Physician.

## 2015-09-16 NOTE — Telephone Encounter (Signed)
Spoke to Myra at [facility] of [place] and advised her that Dr. Brett Fairy recommended pt's own PCP or Hospice's attending. Gwinda Passe said she has already reached out to pt's PCP.

## 2015-09-16 NOTE — Telephone Encounter (Signed)
Hospice advised [place] will be attending

## 2015-09-16 NOTE — Telephone Encounter (Signed)
[place] for attending, and ok for hospice MD to handle prn meds

## 2015-09-17 DIAGNOSIS — J301 Allergic rhinitis due to pollen: Secondary | ICD-10-CM | POA: Diagnosis not present

## 2015-09-17 DIAGNOSIS — G20 Parkinson's disease: Secondary | ICD-10-CM | POA: Diagnosis not present

## 2015-09-17 DIAGNOSIS — G2581 Restless legs syndrome: Secondary | ICD-10-CM | POA: Diagnosis not present

## 2015-09-17 DIAGNOSIS — N3281 Overactive bladder: Secondary | ICD-10-CM | POA: Diagnosis not present

## 2015-09-17 DIAGNOSIS — I10 Essential (primary) hypertension: Secondary | ICD-10-CM | POA: Diagnosis not present

## 2015-09-17 DIAGNOSIS — G3183 Dementia with Lewy bodies: Secondary | ICD-10-CM | POA: Diagnosis not present

## 2015-09-17 DIAGNOSIS — F339 Major depressive disorder, recurrent, unspecified: Secondary | ICD-10-CM | POA: Diagnosis not present

## 2015-09-17 DIAGNOSIS — J4520 Mild intermittent asthma, uncomplicated: Secondary | ICD-10-CM | POA: Diagnosis not present

## 2015-09-17 DIAGNOSIS — R1310 Dysphagia, unspecified: Secondary | ICD-10-CM | POA: Diagnosis not present

## 2015-09-17 DIAGNOSIS — I2510 Atherosclerotic heart disease of native coronary artery without angina pectoris: Secondary | ICD-10-CM | POA: Diagnosis not present

## 2015-09-17 DIAGNOSIS — I4891 Unspecified atrial fibrillation: Secondary | ICD-10-CM | POA: Diagnosis not present

## 2015-09-18 ENCOUNTER — Telehealth: Payer: Self-pay | Admitting: Internal Medicine

## 2015-09-18 NOTE — Telephone Encounter (Signed)
Ok for verbal 

## 2015-09-18 NOTE — Telephone Encounter (Signed)
Helene Kelp advised that she admitted the patient to hospice yesterday. Patient is requesting a DNR. She was hoping to get a verbal so that the doctor on site over there can sign and take one out today.

## 2015-09-18 NOTE — Telephone Encounter (Signed)
Called teresa no answer LMOM with md response...[name] [name]

## 2015-09-18 NOTE — Telephone Encounter (Signed)
Please advise, thanks.

## 2015-09-19 DIAGNOSIS — F339 Major depressive disorder, recurrent, unspecified: Secondary | ICD-10-CM | POA: Diagnosis not present

## 2015-09-19 DIAGNOSIS — I10 Essential (primary) hypertension: Secondary | ICD-10-CM | POA: Diagnosis not present

## 2015-09-19 DIAGNOSIS — G3183 Dementia with Lewy bodies: Secondary | ICD-10-CM | POA: Diagnosis not present

## 2015-09-19 DIAGNOSIS — G20 Parkinson's disease: Secondary | ICD-10-CM | POA: Diagnosis not present

## 2015-09-19 DIAGNOSIS — R1310 Dysphagia, unspecified: Secondary | ICD-10-CM | POA: Diagnosis not present

## 2015-09-19 DIAGNOSIS — G2581 Restless legs syndrome: Secondary | ICD-10-CM | POA: Diagnosis not present

## 2015-09-22 DIAGNOSIS — G2581 Restless legs syndrome: Secondary | ICD-10-CM | POA: Diagnosis not present

## 2015-09-22 DIAGNOSIS — R1310 Dysphagia, unspecified: Secondary | ICD-10-CM | POA: Diagnosis not present

## 2015-09-22 DIAGNOSIS — G20 Parkinson's disease: Secondary | ICD-10-CM | POA: Diagnosis not present

## 2015-09-22 DIAGNOSIS — G3183 Dementia with Lewy bodies: Secondary | ICD-10-CM | POA: Diagnosis not present

## 2015-09-22 DIAGNOSIS — F339 Major depressive disorder, recurrent, unspecified: Secondary | ICD-10-CM | POA: Diagnosis not present

## 2015-09-22 DIAGNOSIS — I10 Essential (primary) hypertension: Secondary | ICD-10-CM | POA: Diagnosis not present

## 2015-09-24 DIAGNOSIS — R1310 Dysphagia, unspecified: Secondary | ICD-10-CM | POA: Diagnosis not present

## 2015-09-24 DIAGNOSIS — G3183 Dementia with Lewy bodies: Secondary | ICD-10-CM | POA: Diagnosis not present

## 2015-09-24 DIAGNOSIS — F339 Major depressive disorder, recurrent, unspecified: Secondary | ICD-10-CM | POA: Diagnosis not present

## 2015-09-24 DIAGNOSIS — G2581 Restless legs syndrome: Secondary | ICD-10-CM | POA: Diagnosis not present

## 2015-09-24 DIAGNOSIS — I10 Essential (primary) hypertension: Secondary | ICD-10-CM | POA: Diagnosis not present

## 2015-09-24 DIAGNOSIS — G20 Parkinson's disease: Secondary | ICD-10-CM | POA: Diagnosis not present

## 2015-09-30 DIAGNOSIS — G3183 Dementia with Lewy bodies: Secondary | ICD-10-CM | POA: Diagnosis not present

## 2015-09-30 DIAGNOSIS — G2581 Restless legs syndrome: Secondary | ICD-10-CM | POA: Diagnosis not present

## 2015-09-30 DIAGNOSIS — F339 Major depressive disorder, recurrent, unspecified: Secondary | ICD-10-CM | POA: Diagnosis not present

## 2015-09-30 DIAGNOSIS — I10 Essential (primary) hypertension: Secondary | ICD-10-CM | POA: Diagnosis not present

## 2015-09-30 DIAGNOSIS — R1310 Dysphagia, unspecified: Secondary | ICD-10-CM | POA: Diagnosis not present

## 2015-09-30 DIAGNOSIS — G20 Parkinson's disease: Secondary | ICD-10-CM | POA: Diagnosis not present

## 2015-10-01 DIAGNOSIS — G3183 Dementia with Lewy bodies: Secondary | ICD-10-CM | POA: Diagnosis not present

## 2015-10-01 DIAGNOSIS — R1310 Dysphagia, unspecified: Secondary | ICD-10-CM | POA: Diagnosis not present

## 2015-10-01 DIAGNOSIS — I10 Essential (primary) hypertension: Secondary | ICD-10-CM | POA: Diagnosis not present

## 2015-10-01 DIAGNOSIS — G20 Parkinson's disease: Secondary | ICD-10-CM | POA: Diagnosis not present

## 2015-10-01 DIAGNOSIS — G2581 Restless legs syndrome: Secondary | ICD-10-CM | POA: Diagnosis not present

## 2015-10-01 DIAGNOSIS — F339 Major depressive disorder, recurrent, unspecified: Secondary | ICD-10-CM | POA: Diagnosis not present

## 2015-10-06 DIAGNOSIS — G3183 Dementia with Lewy bodies: Secondary | ICD-10-CM | POA: Diagnosis not present

## 2015-10-06 DIAGNOSIS — G20 Parkinson's disease: Secondary | ICD-10-CM | POA: Diagnosis not present

## 2015-10-06 DIAGNOSIS — I10 Essential (primary) hypertension: Secondary | ICD-10-CM | POA: Diagnosis not present

## 2015-10-06 DIAGNOSIS — R1310 Dysphagia, unspecified: Secondary | ICD-10-CM | POA: Diagnosis not present

## 2015-10-06 DIAGNOSIS — F339 Major depressive disorder, recurrent, unspecified: Secondary | ICD-10-CM | POA: Diagnosis not present

## 2015-10-06 DIAGNOSIS — G2581 Restless legs syndrome: Secondary | ICD-10-CM | POA: Diagnosis not present

## 2015-10-07 ENCOUNTER — Other Ambulatory Visit: Payer: Self-pay

## 2015-10-07 DIAGNOSIS — G3183 Dementia with Lewy bodies: Secondary | ICD-10-CM | POA: Diagnosis not present

## 2015-10-07 DIAGNOSIS — I10 Essential (primary) hypertension: Secondary | ICD-10-CM | POA: Diagnosis not present

## 2015-10-07 DIAGNOSIS — F339 Major depressive disorder, recurrent, unspecified: Secondary | ICD-10-CM | POA: Diagnosis not present

## 2015-10-07 DIAGNOSIS — G2581 Restless legs syndrome: Secondary | ICD-10-CM | POA: Diagnosis not present

## 2015-10-07 DIAGNOSIS — G20 Parkinson's disease: Secondary | ICD-10-CM | POA: Diagnosis not present

## 2015-10-07 DIAGNOSIS — R1310 Dysphagia, unspecified: Secondary | ICD-10-CM | POA: Diagnosis not present

## 2015-10-07 MED ORDER — DESLORATADINE 5 MG PO TABS: 5.0000 mg | ORAL_TABLET | Freq: Every day | ORAL | Status: AC

## 2015-10-07 MED ORDER — ALBUTEROL SULFATE HFA 108 (90 BASE) MCG/ACT IN AERS: INHALATION_SPRAY | RESPIRATORY_TRACT | Status: DC

## 2015-10-07 MED ORDER — SOLIFENACIN SUCCINATE 5 MG PO TABS: 5.0000 mg | ORAL_TABLET | Freq: Every day | ORAL | Status: DC

## 2015-10-07 MED ORDER — FLUTICASONE FUROATE 27.5 MCG/SPRAY NA SUSP: NASAL | Status: DC

## 2015-10-07 MED ORDER — FLUTICASONE-SALMETEROL 250-50 MCG/DOSE IN AEPB: 1.0000 | INHALATION_SPRAY | Freq: Two times a day (BID) | RESPIRATORY_TRACT | Status: DC

## 2015-10-07 MED ORDER — APIXABAN 5 MG PO TABS: 5.0000 mg | ORAL_TABLET | Freq: Two times a day (BID) | ORAL | Status: AC

## 2015-10-07 MED ORDER — AMLODIPINE BESYLATE 5 MG PO TABS: 5.0000 mg | ORAL_TABLET | Freq: Every day | ORAL | Status: AC

## 2015-10-07 MED ORDER — LEVOCETIRIZINE DIHYDROCHLORIDE 5 MG PO TABS: 5.0000 mg | ORAL_TABLET | Freq: Every evening | ORAL | Status: AC

## 2015-10-08 DIAGNOSIS — R1310 Dysphagia, unspecified: Secondary | ICD-10-CM | POA: Diagnosis not present

## 2015-10-08 DIAGNOSIS — I10 Essential (primary) hypertension: Secondary | ICD-10-CM | POA: Diagnosis not present

## 2015-10-08 DIAGNOSIS — F339 Major depressive disorder, recurrent, unspecified: Secondary | ICD-10-CM | POA: Diagnosis not present

## 2015-10-08 DIAGNOSIS — G2581 Restless legs syndrome: Secondary | ICD-10-CM | POA: Diagnosis not present

## 2015-10-08 DIAGNOSIS — G3183 Dementia with Lewy bodies: Secondary | ICD-10-CM | POA: Diagnosis not present

## 2015-10-08 DIAGNOSIS — G20 Parkinson's disease: Secondary | ICD-10-CM | POA: Diagnosis not present

## 2015-10-09 DIAGNOSIS — F339 Major depressive disorder, recurrent, unspecified: Secondary | ICD-10-CM | POA: Diagnosis not present

## 2015-10-09 DIAGNOSIS — J301 Allergic rhinitis due to pollen: Secondary | ICD-10-CM | POA: Diagnosis not present

## 2015-10-09 DIAGNOSIS — G3183 Dementia with Lewy bodies: Secondary | ICD-10-CM | POA: Diagnosis not present

## 2015-10-09 DIAGNOSIS — G2581 Restless legs syndrome: Secondary | ICD-10-CM | POA: Diagnosis not present

## 2015-10-09 DIAGNOSIS — I4891 Unspecified atrial fibrillation: Secondary | ICD-10-CM | POA: Diagnosis not present

## 2015-10-09 DIAGNOSIS — J4520 Mild intermittent asthma, uncomplicated: Secondary | ICD-10-CM | POA: Diagnosis not present

## 2015-10-09 DIAGNOSIS — I10 Essential (primary) hypertension: Secondary | ICD-10-CM | POA: Diagnosis not present

## 2015-10-09 DIAGNOSIS — N3281 Overactive bladder: Secondary | ICD-10-CM | POA: Diagnosis not present

## 2015-10-09 DIAGNOSIS — R1310 Dysphagia, unspecified: Secondary | ICD-10-CM | POA: Diagnosis not present

## 2015-10-09 DIAGNOSIS — G20 Parkinson's disease: Secondary | ICD-10-CM | POA: Diagnosis not present

## 2015-10-09 DIAGNOSIS — I2510 Atherosclerotic heart disease of native coronary artery without angina pectoris: Secondary | ICD-10-CM | POA: Diagnosis not present

## 2015-10-10 ENCOUNTER — Telehealth: Payer: Self-pay | Admitting: Internal Medicine

## 2015-10-10 MED ORDER — FLUTICASONE-SALMETEROL 250-50 MCG/DOSE IN AEPB: 1.0000 | INHALATION_SPRAY | Freq: Two times a day (BID) | RESPIRATORY_TRACT | Status: DC

## 2015-10-10 NOTE — Telephone Encounter (Signed)
Please call [facility] at [phone] -  She states advair usually not something that is used for as needed.

## 2015-10-10 NOTE — Telephone Encounter (Signed)
Rx updated and re-sent.

## 2015-10-22 ENCOUNTER — Ambulatory Visit: Payer: Medicare Other | Admitting: Internal Medicine

## 2015-11-09 DIAGNOSIS — G2581 Restless legs syndrome: Secondary | ICD-10-CM | POA: Diagnosis not present

## 2015-11-09 DIAGNOSIS — I2510 Atherosclerotic heart disease of native coronary artery without angina pectoris: Secondary | ICD-10-CM | POA: Diagnosis not present

## 2015-11-09 DIAGNOSIS — N3281 Overactive bladder: Secondary | ICD-10-CM | POA: Diagnosis not present

## 2015-11-09 DIAGNOSIS — F339 Major depressive disorder, recurrent, unspecified: Secondary | ICD-10-CM | POA: Diagnosis not present

## 2015-11-09 DIAGNOSIS — I4891 Unspecified atrial fibrillation: Secondary | ICD-10-CM | POA: Diagnosis not present

## 2015-11-09 DIAGNOSIS — G20 Parkinson's disease: Secondary | ICD-10-CM | POA: Diagnosis not present

## 2015-11-09 DIAGNOSIS — G3183 Dementia with Lewy bodies: Secondary | ICD-10-CM | POA: Diagnosis not present

## 2015-11-09 DIAGNOSIS — J301 Allergic rhinitis due to pollen: Secondary | ICD-10-CM | POA: Diagnosis not present

## 2015-11-09 DIAGNOSIS — J4520 Mild intermittent asthma, uncomplicated: Secondary | ICD-10-CM | POA: Diagnosis not present

## 2015-11-09 DIAGNOSIS — I10 Essential (primary) hypertension: Secondary | ICD-10-CM | POA: Diagnosis not present

## 2015-11-09 DIAGNOSIS — R1310 Dysphagia, unspecified: Secondary | ICD-10-CM | POA: Diagnosis not present

## 2015-11-19 ENCOUNTER — Telehealth: Payer: Self-pay | Admitting: Internal Medicine

## 2015-11-19 NOTE — Telephone Encounter (Signed)
Varney Biles called to advise that the patient is going to [place] for a respite stay from 11/21/2015-11/26/2015.

## 2015-11-21 DIAGNOSIS — Z755 Holiday relief care: Secondary | ICD-10-CM | POA: Diagnosis not present

## 2015-11-26 DIAGNOSIS — Z755 Holiday relief care: Secondary | ICD-10-CM | POA: Diagnosis not present

## 2015-12-10 ENCOUNTER — Ambulatory Visit: Payer: Medicare Other | Admitting: Adult Health

## 2015-12-10 DIAGNOSIS — R1310 Dysphagia, unspecified: Secondary | ICD-10-CM | POA: Diagnosis not present

## 2015-12-10 DIAGNOSIS — I4891 Unspecified atrial fibrillation: Secondary | ICD-10-CM | POA: Diagnosis not present

## 2015-12-10 DIAGNOSIS — I10 Essential (primary) hypertension: Secondary | ICD-10-CM | POA: Diagnosis not present

## 2015-12-10 DIAGNOSIS — I2510 Atherosclerotic heart disease of native coronary artery without angina pectoris: Secondary | ICD-10-CM | POA: Diagnosis not present

## 2015-12-10 DIAGNOSIS — G3183 Dementia with Lewy bodies: Secondary | ICD-10-CM | POA: Diagnosis not present

## 2015-12-10 DIAGNOSIS — G20 Parkinson's disease: Secondary | ICD-10-CM | POA: Diagnosis not present

## 2015-12-10 DIAGNOSIS — J301 Allergic rhinitis due to pollen: Secondary | ICD-10-CM | POA: Diagnosis not present

## 2015-12-10 DIAGNOSIS — G2581 Restless legs syndrome: Secondary | ICD-10-CM | POA: Diagnosis not present

## 2015-12-10 DIAGNOSIS — F339 Major depressive disorder, recurrent, unspecified: Secondary | ICD-10-CM | POA: Diagnosis not present

## 2015-12-10 DIAGNOSIS — J4520 Mild intermittent asthma, uncomplicated: Secondary | ICD-10-CM | POA: Diagnosis not present

## 2015-12-10 DIAGNOSIS — N3281 Overactive bladder: Secondary | ICD-10-CM | POA: Diagnosis not present

## 2015-12-11 ENCOUNTER — Other Ambulatory Visit: Payer: Self-pay | Admitting: Internal Medicine

## 2015-12-11 MED ORDER — FLUTICASONE FUROATE 27.5 MCG/SPRAY NA SUSP: NASAL | Status: DC

## 2016-01-07 DIAGNOSIS — R1310 Dysphagia, unspecified: Secondary | ICD-10-CM | POA: Diagnosis not present

## 2016-01-07 DIAGNOSIS — N3281 Overactive bladder: Secondary | ICD-10-CM | POA: Diagnosis not present

## 2016-01-07 DIAGNOSIS — I2510 Atherosclerotic heart disease of native coronary artery without angina pectoris: Secondary | ICD-10-CM | POA: Diagnosis not present

## 2016-01-07 DIAGNOSIS — G20 Parkinson's disease: Secondary | ICD-10-CM | POA: Diagnosis not present

## 2016-01-07 DIAGNOSIS — G3183 Dementia with Lewy bodies: Secondary | ICD-10-CM | POA: Diagnosis not present

## 2016-01-07 DIAGNOSIS — G2581 Restless legs syndrome: Secondary | ICD-10-CM | POA: Diagnosis not present

## 2016-01-07 DIAGNOSIS — I4891 Unspecified atrial fibrillation: Secondary | ICD-10-CM | POA: Diagnosis not present

## 2016-01-07 DIAGNOSIS — I10 Essential (primary) hypertension: Secondary | ICD-10-CM | POA: Diagnosis not present

## 2016-01-07 DIAGNOSIS — J301 Allergic rhinitis due to pollen: Secondary | ICD-10-CM | POA: Diagnosis not present

## 2016-01-07 DIAGNOSIS — J4520 Mild intermittent asthma, uncomplicated: Secondary | ICD-10-CM | POA: Diagnosis not present

## 2016-01-07 DIAGNOSIS — F339 Major depressive disorder, recurrent, unspecified: Secondary | ICD-10-CM | POA: Diagnosis not present

## 2016-01-26 ENCOUNTER — Encounter: Payer: Self-pay | Admitting: Adult Health

## 2016-01-26 ENCOUNTER — Ambulatory Visit (INDEPENDENT_AMBULATORY_CARE_PROVIDER_SITE_OTHER): Admitting: Adult Health

## 2016-01-26 VITALS — BP 128/70 | HR 74 | Resp 18 | Ht 69.0 in | Wt 227.0 lb

## 2016-01-26 DIAGNOSIS — R269 Unspecified abnormalities of gait and mobility: Secondary | ICD-10-CM

## 2016-01-26 DIAGNOSIS — G232 Striatonigral degeneration: Secondary | ICD-10-CM

## 2016-01-26 NOTE — Patient Instructions (Signed)
Try to taking Rytary 4 times a day If your symptoms worsen or you develop new symptoms please let us know.

## 2016-01-26 NOTE — Progress Notes (Signed)
PATIENT: [name] [name] DOB: [birth year]  REASON FOR VISIT: follow up HISTORY FROM: patient  HISTORY OF PRESENT ILLNESS: Mr. 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 He returns today for follow-up. The patient is on rytrary 3 times a day. The wife admits that she thought he was suppose to be taken 3 times a day when actually the prescription is for 4 times a day. She has noticed that his tremor has gotten worse. The patient's mobility has significantly declined. She states that he can barely move his legs at this point. He also has limited range of motion in the left arm. He has to use a lift for transfers. The patient did get a motorized wheelchair however he does not like this and therefore does not use it. They have noticed difficulty with his swallowing. He is on a modified diet due to his swallowing. They feel that his mucus is thicker and he will sometimes get choked on this. He is already on Mucinex twice a day. The wife has assistance that comes into the home daily. The patient is also on hospice at this time. She states that he may be moved to palliative in the future. The wife is stating that Dr. Judi Cong has a homebound service where a nurse or nurse practitioner comes and evaluates the patient in his home and reports back to Dr. Judi Cong? She states this is so he does not have to go in for an office visit. He returns today for an evaluation.  HISTORY ( Dohmeier): Interval history from 09-09-15  The patient has done very well on rytary the tremor is controlled . In the meantime he has lost bladder control, his wife has noted that he has more and more difficulties to move his right arm. In the left arm with frozen due to arthritis... He does have dry mouth and dry eyes often his mouth is open. He has a rare blink. All this fits his Parkinson's disease he has dysarthria and by now has some apraxia he has only learned the instinct of swallowing.  The right tarry pills also has the benefit of being smaller and easier to be taken by mouth for him. He has lost control of his right arm, he does not control his feet or legs very well and he needs full assist when transferring or just turning in bed his wife also can no longer assist him alone due to his body weight. He uses thick liquids honey thickened liquids to avoid choking. Mrs. Escano suffers from a progressive neurodegenerative disease and has the character of multisystem atrophy/ asymmetric parkinsonism, with dysautonomic features as well. He also has some memory loss and cognitive deficits. In 12 month he had 4 pneumonias and 3 UTI s.  His wife is putting together 24 hour care with the help of LTC insurance. She may involve hospice. This would be an assistance with medication and which the intake of medication. Hospice usually also handles pain medication for patients with fatal illness. His wife noticed that she needs help to put his 16+ medications each day into the right pillbox. We will have we will change as many as possible to 90 day supplies. I expect that the patient's feet are swollen because he is not actively walking and doesn't have the muscle pump to put fluid back into his system. Home hospice could help with a visit every week. He also needs transport aide ,he cannot sit in  a normal personal car anymore, it is too difficult to get in and out.  [name] transport will transfer the patient for feet. He is not longer able to follow PT.    REVIEW OF SYSTEMS: Out of a complete 14 system review of symptoms, the patient complains only of the following symptoms, and all other reviewed systems are negative.  Activity change, appetite change, facial swelling, trouble swallowing, cough, wheezing, shortness of breath, choking, leg swelling, palpitations, light sensitivity, eye redness, eye itching, cold intolerance, heat intolerance, swollen abdomen, rectal bleeding, constipation,  incontinence of bowels, restless leg, apnea, daytime sleepiness, sleep talking, rash, itching, muscle cramps, aching muscles, joint pain, joint swelling, incontinence of bladder, environmental allergies, bruise/bleed easily, numbness, speech difficulty, tremors, facial drooping, confusion, decreased concentration, nervous/anxious  ALLERGIES: Allergies  Allergen Reactions  . Lisinopril Swelling  . Statins     parkinsons   . Thorazine [Chlorpromazine]     Confusion/loopy    HOME MEDICATIONS: Outpatient Prescriptions Prior to Visit  Medication Sig Dispense Refill  . albuterol (PROAIR HFA) 108 (90 BASE) MCG/ACT inhaler USE 2 INHALATIONS ORALLY   EVERY 4 HOURS AS NEEDED FORWHEEZING 25.5 g 3  . ALPRAZolam (XANAX) 0.5 MG tablet Take 0.5 mg by mouth as needed.  5  . amLODipine (NORVASC) 5 MG tablet Take 1 tablet (5 mg total) by mouth daily. 90 tablet 3  . apixaban (ELIQUIS) 5 MG TABS tablet Take 1 tablet (5 mg total) by mouth 2 (two) times daily. 180 tablet 3  . buPROPion (WELLBUTRIN XL) 150 MG 24 hr tablet TAKE 1 TABLET DAILY 90 tablet 3  . Carbidopa-Levodopa ER (RYTARY) 36.25-145 MG CPCR Take 1 capsule by mouth 4 (four) times daily - after meals and at bedtime. 360 capsule 3  . clonazePAM (KLONOPIN) 1 MG tablet Take 2 tablets (2 mg total) by mouth at bedtime. Can take 1/2 tablet more, if having difficulty sleeping. 225 tablet 1  . desloratadine (CLARINEX) 5 MG tablet Take 1 tablet (5 mg total) by mouth daily. 90 tablet 3  . docusate sodium (COLACE) 100 MG capsule Take 300 mg by mouth at bedtime.     . donepezil (ARICEPT) 23 MG TABS tablet Take 1 tablet (23 mg total) by mouth every morning. 90 tablet 3  . fluticasone (VERAMYST) 27.5 MCG/SPRAY nasal spray USE 1 SPRAY NASALLY TWICE  DAILY 30 g 2  . Fluticasone-Salmeterol (ADVAIR) 250-50 MCG/DOSE AEPB Inhale 1 puff into the lungs 2 (two) times daily. 180 each 1  . haloperidol (HALDOL) 2 MG tablet Take 1 tablet (2 mg total) by mouth every 8 (eight)  hours as needed (hiccups). 10 tablet 0  . ibuprofen (ADVIL,MOTRIN) 200 MG tablet Take 400 mg by mouth every 6 (six) hours as needed for pain.    Marland Kitchen levocetirizine (XYZAL) 5 MG tablet Take 1 tablet (5 mg total) by mouth every evening. 90 tablet 3  . primidone (MYSOLINE) 50 MG tablet Take 1 tablet (50 mg total) by mouth 2 (two) times daily before a meal. 180 tablet 3  . solifenacin (VESICARE) 5 MG tablet Take 1 tablet (5 mg total) by mouth daily. 90 tablet 3  . triamcinolone cream (KENALOG) 0.1 % Apply 1 application topically 2 (two) times daily. 30 g 0   No facility-administered medications prior to visit.    PAST MEDICAL HISTORY: Past Medical History  Diagnosis Date  . ONYCHOMYCOSIS, TOENAILS 06/27/2008  . HYPERLIPIDEMIA 07/28/2007  . ANXIETY 09/28/2007  . OBSTRUCTIVE SLEEP APNEA 01/01/2008  . RESTLESS LEGS  SYNDROME 12/26/2007  . HEARING LOSS 06/27/2008  . HYPERTENSION 07/28/2007  . MYOCARDIAL INFARCTION, HX OF 07/28/2007  . CORONARY ARTERY DISEASE 07/28/2007  . Atrial fibrillation ([place]) 09/28/2007  . SINUSITIS- ACUTE-NOS 10/10/2007  . ALLERGIC RHINITIS 09/28/2007  . CHOLELITHIASIS 09/28/2007  . BENIGN PROSTATIC HYPERTROPHY 09/28/2007  . CELLULITIS, HAND, LEFT 09/24/2010  . SYNCOPE 07/28/2007  . INSOMNIA-SLEEP DISORDER-UNSPEC 04/03/2009  . HYPERSOMNIA WITH SLEEP APNEA UNSPECIFIED 12/26/2007  . FATIGUE 01/01/2008  . DYSPNEA 04/03/2009  . Wheezing 11/06/2009  . ANGIOEDEMA 11/06/2009  . COLON CANCER, HX OF 07/28/2007  . PROSTATE CANCER, HX OF 07/28/2007  . COLONIC POLYPS, HX OF 07/28/2007  . Long term current use of anticoagulant 12/22/2010  . Asthma   . DEMENTIA   . Spinal stenosis   . History of atrial fibrillation   . Lumbar stenosis 10/30/2011  . CAD (coronary artery disease)     Severe three-vessel coronary disease. LIMA to the LAD patent. SVG to PDA patent. SVG to OM 3 patent. Preserved ejection fraction. 2008.  . Cancer ([place])   . Thyroid disease   . Arthritis   . Pneumonia   .  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 Sepsis with acute organ dysfunction ([place]) 09/21/2011  . 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 Cervical disc disorder with radiculopathy of cervical region 04/15/2014  . 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 Multiple system atrophy, Parkinson variant ([place]) 10/01/2014    PAST SURGICAL HISTORY: Past Surgical History  Procedure Laterality Date  . Prostatectomy  2001  . Cardioversion  2007  . Stress cardiolite  02/08/2007  . Schocardiograph  10/07/2006  . Right thyroid lobectomy    . Coronary artery bypass graft  1995    x 3  . Hernia repair  1998  . Colonoscopy  2010    last done 2010- clear  . Cardiac electrophysiology study and ablation    . Thyroidectomy, partial      FAMILY HISTORY: Family History  Problem Relation Age of Onset  . Coronary artery disease Father     before 33 yo  . Coronary artery disease Sister     before 18 yo  . Hypertension Other     SOCIAL HISTORY: Social History   Social History  . Marital Status: Married    Spouse Name: Diane  . Number of Children: 2  . Years of Education: 14   Occupational History  . retired Health visitor    Social History Main Topics  . Smoking status: Never Smoker   . Smokeless tobacco: Never Used  . Alcohol Use: No  . Drug Use: No  . Sexual Activity: Not on file   Other Topics Concern  . Not on file   Social History Narrative   Patient is married (Diane) and lives at home with his wife.   Patient has 7 children.   Patient is retired.   Patient has a Associates degree.   Patient is right-handed.   Patient does not drink any caffeine.      PHYSICAL EXAM  Filed Vitals:   01/26/16 1448  BP: 128/70  Pulse: 74  Resp: 18  Height: 5\' 9"  (1.753 m)  Weight: 227 lb (102.967 kg)   Body mass index is 33.51 kg/(m^2).  Generalized: Well developed, in no  acute distress   Neurological examination  Mentation: Alert oriented to time, place, history taking. Follows all commands  speech and language fluent Cranial nerve II-XII: Pupils were equal round reactive to light. Extraocular movements were full, visual field were full on confrontational test. Facial sensation and strength were normal. Uvula tongue midline. Limited range of motion of the neck. Motor: The motor testing reveals 4/5 in the upper extremities. Rigidity noted in the lower extremity. No dyskinesias. Mild tremor in the hands. Sensory: Sensory testing is intact to soft touch on all 4 extremities. No evidence of extinction is noted.  Coordination: Cerebellar testing reveals good finger-nose-finger on the right difficulty on the left due to limited range of motion. Difficulty with heel-to-shin bilaterally due to rigidity and limited range of motion Gait and station: Patient is in a wheelchair. He is unable to stand without using a lift.    DIAGNOSTIC DATA (LABS, IMAGING, TESTING) - I reviewed patient records, labs, notes, testing and imaging myself where available.  Lab Results  Component Value Date   WBC 8.8 07/03/2015   HGB 16.3 07/03/2015   HCT 47.9 07/03/2015   MCV 93.9 07/03/2015   PLT 172 07/03/2015         ASSESSMENT AND PLAN 81 y.o. year old male  has a past medical history of ONYCHOMYCOSIS, TOENAILS (06/27/2008); HYPERLIPIDEMIA (07/28/2007); ANXIETY (09/28/2007); OBSTRUCTIVE SLEEP APNEA (01/01/2008); RESTLESS LEGS SYNDROME (12/26/2007); HEARING LOSS (06/27/2008); HYPERTENSION (07/28/2007); MYOCARDIAL INFARCTION, HX OF (07/28/2007); CORONARY ARTERY DISEASE (07/28/2007); Atrial fibrillation ([place]) (09/28/2007); SINUSITIS- ACUTE-NOS (10/10/2007); ALLERGIC RHINITIS (09/28/2007); CHOLELITHIASIS (09/28/2007); BENIGN PROSTATIC HYPERTROPHY (09/28/2007); CELLULITIS, HAND, LEFT (09/24/2010); SYNCOPE (07/28/2007); INSOMNIA-SLEEP DISORDER-UNSPEC (04/03/2009); HYPERSOMNIA WITH SLEEP APNEA  UNSPECIFIED (12/26/2007); FATIGUE (01/01/2008); DYSPNEA (04/03/2009); Wheezing (11/06/2009); ANGIOEDEMA (11/06/2009); COLON CANCER, HX OF (07/28/2007); PROSTATE CANCER, HX OF (07/28/2007); COLONIC POLYPS, HX OF (07/28/2007); Long term current use of anticoagulant (12/22/2010); Asthma; DEMENTIA; Spinal stenosis; History of atrial fibrillation; Lumbar stenosis (10/30/2011); CAD (coronary artery disease); Cancer ([place]); Thyroid disease; Arthritis; Pneumonia; 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 here with:  1. Parkinson's disease- possible multiple system atrophy? 2. Abnormality of gait and mobility  The patient has limited mobility and has to use a lift for transfers. The patient will continue on rytary. The patient was only taking the medication 3 times a day this is been clarified and is supposed be taken 4 times a day. They will increase this medication to 4 times daily. The patient is currently on hospice therapy managed by Dr. Judi Cong. Patient and his wife advised that we will not start any new medication at this time. He will follow-up in 6 months with Dr. Mechele Claude, MSN, NP-C 01/26/2016, 3:06 PM [facility] [address], [place], [place] [address] [phone]

## 2016-01-26 NOTE — Progress Notes (Signed)
I agree with the assessment and plan as directed by NP .The patient is known to me .   Khaniyah Bezek, MD  

## 2016-01-27 ENCOUNTER — Telehealth: Payer: Self-pay | Admitting: Internal Medicine

## 2016-01-27 DIAGNOSIS — G239 Degenerative disease of basal ganglia, unspecified: Principal | ICD-10-CM

## 2016-01-27 DIAGNOSIS — G903 Multi-system degeneration of the autonomic nervous system: Secondary | ICD-10-CM

## 2016-01-27 NOTE — Telephone Encounter (Signed)
Please advise can we do this 

## 2016-01-27 NOTE — Telephone Encounter (Signed)
Done, but not sure if will be ok with insurance since pt not seen by me in last 30 days

## 2016-01-27 NOTE — Telephone Encounter (Signed)
Pt wife called in and would like home health for this pt.  Hospic comes in 1 time a week  But she is needing some home health and help lifting him.  Is this possible to get stated without bring the pt in?

## 2016-01-28 ENCOUNTER — Telehealth: Payer: Self-pay | Admitting: Internal Medicine

## 2016-01-28 NOTE — Telephone Encounter (Signed)
Patient called back in regard.  States she already has someone coming out to lift him.  Wife states patient is bed bound and needs to be in the care of a provider who can assist their needs without coming in.  Please advise.

## 2016-01-28 NOTE — Telephone Encounter (Signed)
Needs amlodipine refill authorization.  Please send to mail order.

## 2016-01-29 NOTE — Telephone Encounter (Signed)
I have done orders or HH but again, I am restricted by the rules  If pt face to face is required and pt cannot be brought to office by private transport, I would suggest non emergency transportation by an ambulance may be required (which is likely more than $500), or even transport to ER for consideration for inpatient management and transition to long term care in a facility  I dont know of other options to satisfy the payers who establish the requirements for approval for payment  Perhaps a soc services consult would be needed, let me know if needs this as well

## 2016-02-03 ENCOUNTER — Telehealth: Payer: Self-pay | Admitting: Neurology

## 2016-02-03 NOTE — Telephone Encounter (Signed)
Jessica with [facility] is calling as 2 medications, primidone 50 mg and apixaban(ELIQUIS) 5 mg, have been prescribed for this patient. She needs to know if she needs to fill both as these medications usually do not work well together.  Please call Jess [phone] using ref [id number].

## 2016-02-03 NOTE — Telephone Encounter (Signed)
Spoke to [place], Software engineer at [employer], and she advises me that the interaction between eliquis and primidone is decreased efficacy of eliquis. She needs clearance from Dr. Brett Fairy to fill the primidone, knowing that it decreases the efficacy of eliquis. I advised Margreta Journey, pharmacist that Dr. Cathlean Cower prescribes the eliquis and he should be contacted to discuss the continuation of eliquis. Meanwhile, I will verify with Dr. Brett Fairy that she is ok with pt continuing primidone with the knowledge that it decreases the efficacy of eliquis.  Dr. Brett Fairy, is it ok for the pt to continue taking the primidone while taking eliquis, knowing that primidone decreases the efficacy of the eliquis? [facility] will NOT fill unless they get your approval. I also advised them they need to speak with Dr. Jenny Reichmann who is prescribing eliquis.

## 2016-02-03 NOTE — Telephone Encounter (Signed)
Dr. Brett Fairy prescribes the primidone for this pt.  Dr. Biagio Borg prescribes the eliquis.  Is it ok for [facility] to fill both of these medications together?

## 2016-02-03 NOTE — Telephone Encounter (Signed)
I don't refill eloquis, thanks.

## 2016-02-04 NOTE — Telephone Encounter (Signed)
Yes, I am happy to continue primidone/ CD

## 2016-02-04 NOTE — Telephone Encounter (Signed)
I called [facility] and spoke to the pharmacist, Jonni Sanger. I advised Jonni Sanger that Dr. Brett Fairy is happy to continue to primidone.

## 2016-02-05 ENCOUNTER — Encounter: Payer: Medicare Other | Admitting: Internal Medicine

## 2016-02-07 DIAGNOSIS — N3281 Overactive bladder: Secondary | ICD-10-CM | POA: Diagnosis not present

## 2016-02-07 DIAGNOSIS — F339 Major depressive disorder, recurrent, unspecified: Secondary | ICD-10-CM | POA: Diagnosis not present

## 2016-02-07 DIAGNOSIS — J4520 Mild intermittent asthma, uncomplicated: Secondary | ICD-10-CM | POA: Diagnosis not present

## 2016-02-07 DIAGNOSIS — I2510 Atherosclerotic heart disease of native coronary artery without angina pectoris: Secondary | ICD-10-CM | POA: Diagnosis not present

## 2016-02-07 DIAGNOSIS — J301 Allergic rhinitis due to pollen: Secondary | ICD-10-CM | POA: Diagnosis not present

## 2016-02-07 DIAGNOSIS — G20 Parkinson's disease: Secondary | ICD-10-CM | POA: Diagnosis not present

## 2016-02-07 DIAGNOSIS — R1310 Dysphagia, unspecified: Secondary | ICD-10-CM | POA: Diagnosis not present

## 2016-02-07 DIAGNOSIS — G3183 Dementia with Lewy bodies: Secondary | ICD-10-CM | POA: Diagnosis not present

## 2016-02-07 DIAGNOSIS — G2581 Restless legs syndrome: Secondary | ICD-10-CM | POA: Diagnosis not present

## 2016-02-07 DIAGNOSIS — I10 Essential (primary) hypertension: Secondary | ICD-10-CM | POA: Diagnosis not present

## 2016-02-07 DIAGNOSIS — I4891 Unspecified atrial fibrillation: Secondary | ICD-10-CM | POA: Diagnosis not present

## 2016-02-18 ENCOUNTER — Telehealth: Payer: Self-pay | Admitting: Internal Medicine

## 2016-02-18 NOTE — Telephone Encounter (Signed)
[name] [name]  She called in to inform that hospice provider refilled pt's medication.

## 2016-03-08 DIAGNOSIS — F339 Major depressive disorder, recurrent, unspecified: Secondary | ICD-10-CM | POA: Diagnosis not present

## 2016-03-08 DIAGNOSIS — I4891 Unspecified atrial fibrillation: Secondary | ICD-10-CM | POA: Diagnosis not present

## 2016-03-08 DIAGNOSIS — N3281 Overactive bladder: Secondary | ICD-10-CM | POA: Diagnosis not present

## 2016-03-08 DIAGNOSIS — R1310 Dysphagia, unspecified: Secondary | ICD-10-CM | POA: Diagnosis not present

## 2016-03-08 DIAGNOSIS — G2581 Restless legs syndrome: Secondary | ICD-10-CM | POA: Diagnosis not present

## 2016-03-08 DIAGNOSIS — G20 Parkinson's disease: Secondary | ICD-10-CM | POA: Diagnosis not present

## 2016-03-08 DIAGNOSIS — G3183 Dementia with Lewy bodies: Secondary | ICD-10-CM | POA: Diagnosis not present

## 2016-03-08 DIAGNOSIS — J301 Allergic rhinitis due to pollen: Secondary | ICD-10-CM | POA: Diagnosis not present

## 2016-03-08 DIAGNOSIS — J4520 Mild intermittent asthma, uncomplicated: Secondary | ICD-10-CM | POA: Diagnosis not present

## 2016-03-08 DIAGNOSIS — I10 Essential (primary) hypertension: Secondary | ICD-10-CM | POA: Diagnosis not present

## 2016-03-08 DIAGNOSIS — I2510 Atherosclerotic heart disease of native coronary artery without angina pectoris: Secondary | ICD-10-CM | POA: Diagnosis not present

## 2016-04-08 DIAGNOSIS — J4520 Mild intermittent asthma, uncomplicated: Secondary | ICD-10-CM | POA: Diagnosis not present

## 2016-04-08 DIAGNOSIS — F339 Major depressive disorder, recurrent, unspecified: Secondary | ICD-10-CM | POA: Diagnosis not present

## 2016-04-08 DIAGNOSIS — R1310 Dysphagia, unspecified: Secondary | ICD-10-CM | POA: Diagnosis not present

## 2016-04-08 DIAGNOSIS — I10 Essential (primary) hypertension: Secondary | ICD-10-CM | POA: Diagnosis not present

## 2016-04-08 DIAGNOSIS — G20 Parkinson's disease: Secondary | ICD-10-CM | POA: Diagnosis not present

## 2016-04-08 DIAGNOSIS — G3183 Dementia with Lewy bodies: Secondary | ICD-10-CM | POA: Diagnosis not present

## 2016-04-08 DIAGNOSIS — G2581 Restless legs syndrome: Secondary | ICD-10-CM | POA: Diagnosis not present

## 2016-04-08 DIAGNOSIS — N3281 Overactive bladder: Secondary | ICD-10-CM | POA: Diagnosis not present

## 2016-04-08 DIAGNOSIS — J301 Allergic rhinitis due to pollen: Secondary | ICD-10-CM | POA: Diagnosis not present

## 2016-04-08 DIAGNOSIS — I2510 Atherosclerotic heart disease of native coronary artery without angina pectoris: Secondary | ICD-10-CM | POA: Diagnosis not present

## 2016-04-08 DIAGNOSIS — I4891 Unspecified atrial fibrillation: Secondary | ICD-10-CM | POA: Diagnosis not present

## 2016-04-12 DIAGNOSIS — I482 Chronic atrial fibrillation: Secondary | ICD-10-CM | POA: Diagnosis not present

## 2016-04-12 DIAGNOSIS — G3183 Dementia with Lewy bodies: Secondary | ICD-10-CM | POA: Diagnosis not present

## 2016-04-12 DIAGNOSIS — R52 Pain, unspecified: Secondary | ICD-10-CM | POA: Diagnosis not present

## 2016-04-12 DIAGNOSIS — I10 Essential (primary) hypertension: Secondary | ICD-10-CM | POA: Diagnosis not present

## 2016-05-01 IMAGING — CT CT RENAL STONE PROTOCOL
2 of 4 series · 16 of 46 positions shown, 18 images · non-contrast
Comparison: 10/27/2010

CLINICAL DATA: Back pain and gross hematuria

EXAM:
CT ABDOMEN AND PELVIS WITHOUT CONTRAST
TECHNIQUE: Multidetector CT imaging of the abdomen and pelvis was performed
following the standard protocol without IV contrast.

[Series 2: stone study 5.0 i30f 1 · axial · 0.98mm/px · z∈[-382,+62]mm · 13 of 97 slices shown, 15 images]
[im 4/97  soft-tissue]
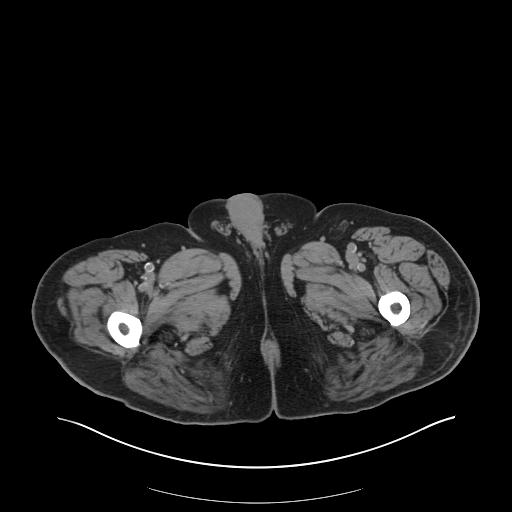
[im 4/97  bone]
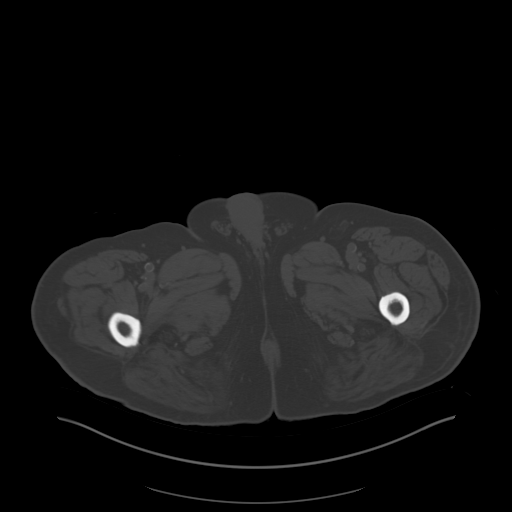
[im 12/97  soft-tissue]
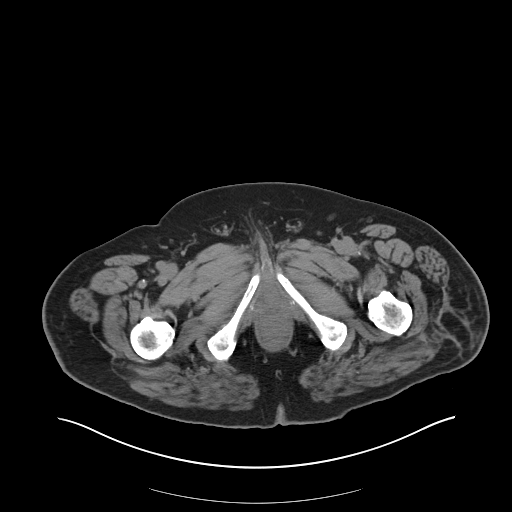
[im 20/97  soft-tissue]
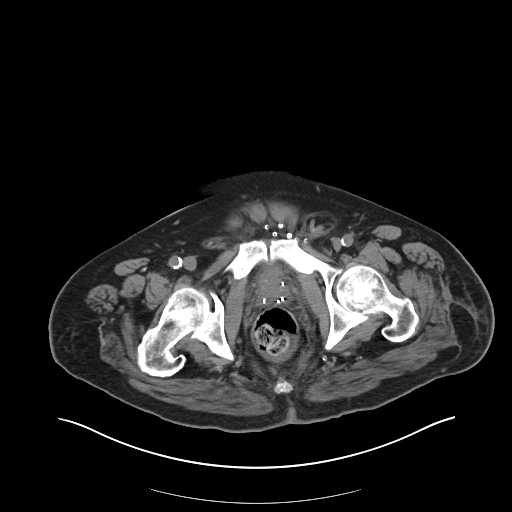
[im 27/97  soft-tissue]
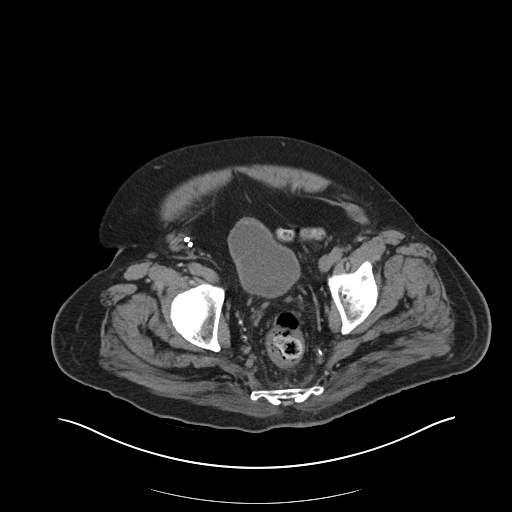
[im 35/97  soft-tissue]
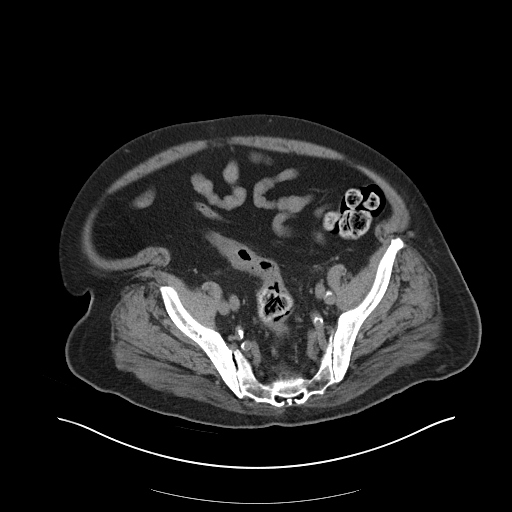
[im 43/97  soft-tissue]
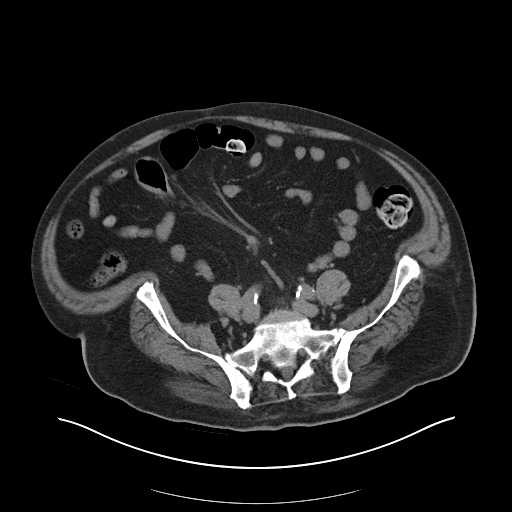
[im 50/97  soft-tissue]
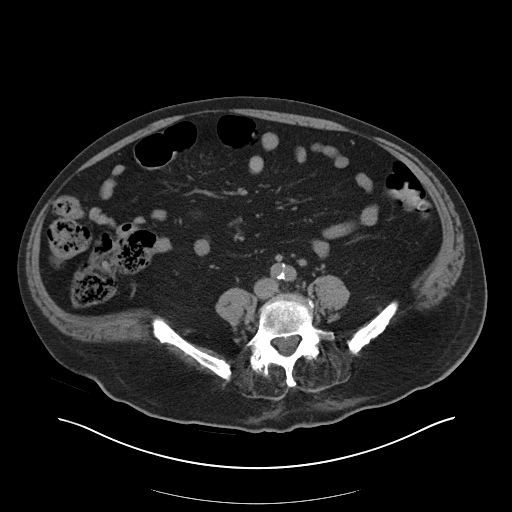
[im 54/97  soft-tissue]
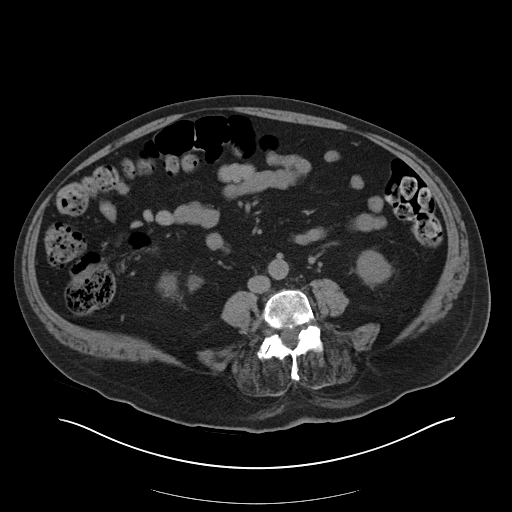
[im 62/97  soft-tissue]
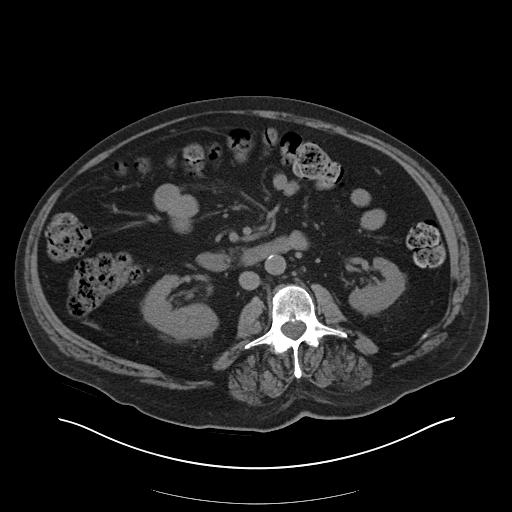
[im 62/97  bone]
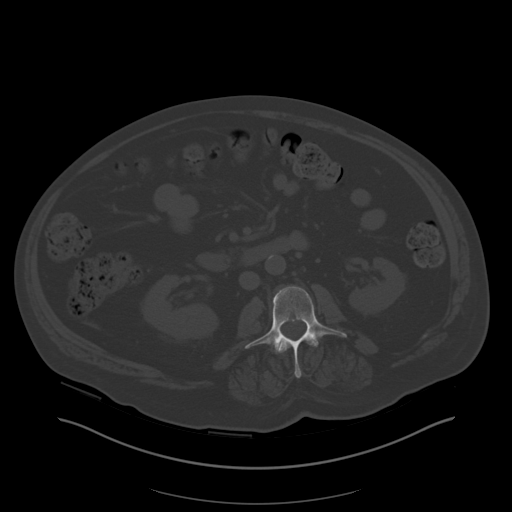
[im 70/97  soft-tissue]
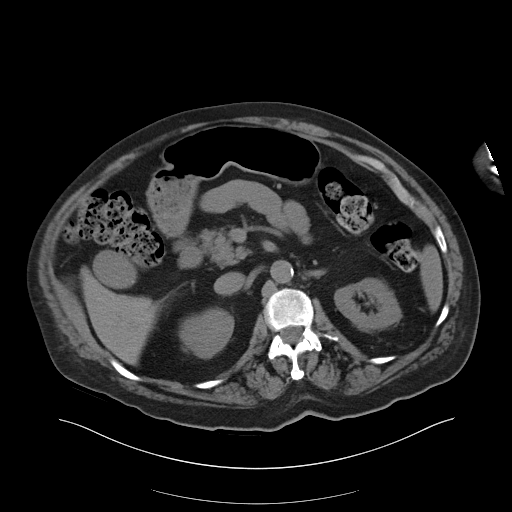
[im 77/97  soft-tissue]
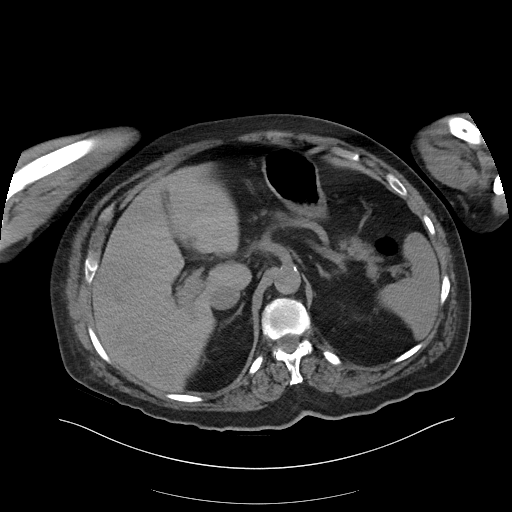
[im 85/97  soft-tissue]
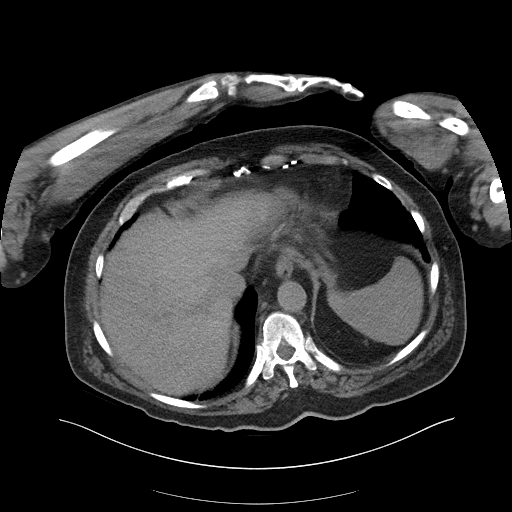
[im 93/97  soft-tissue]
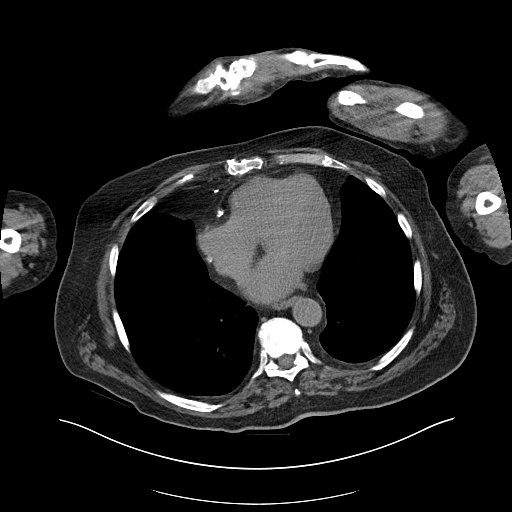

[Series 5: coronal soft tissue · coronal · 1.00mm/px · 3 of 110 slices shown]
[im 37/110  soft-tissue]
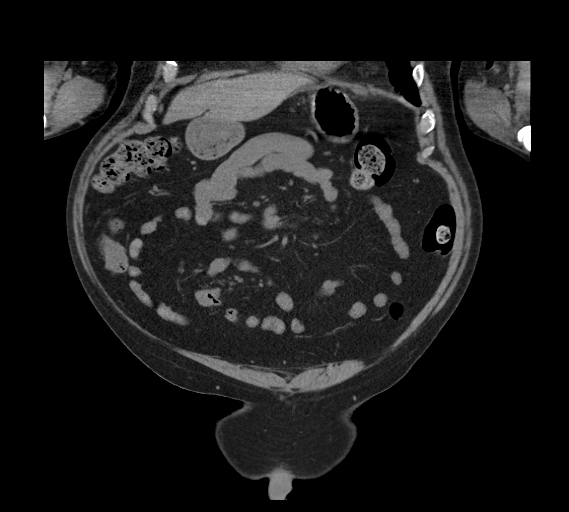
[im 49/110  soft-tissue]
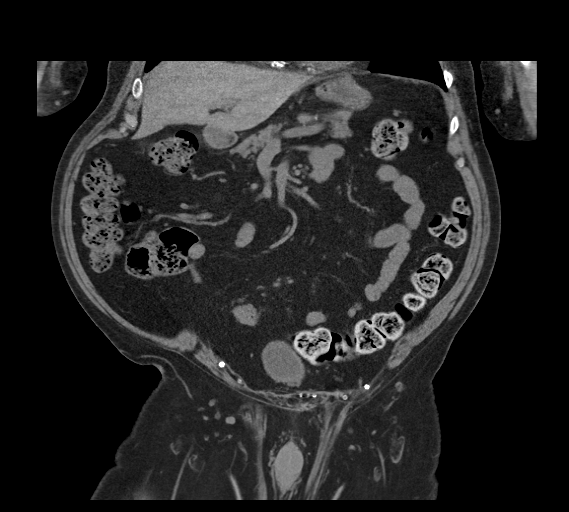
[im 61/110  soft-tissue]
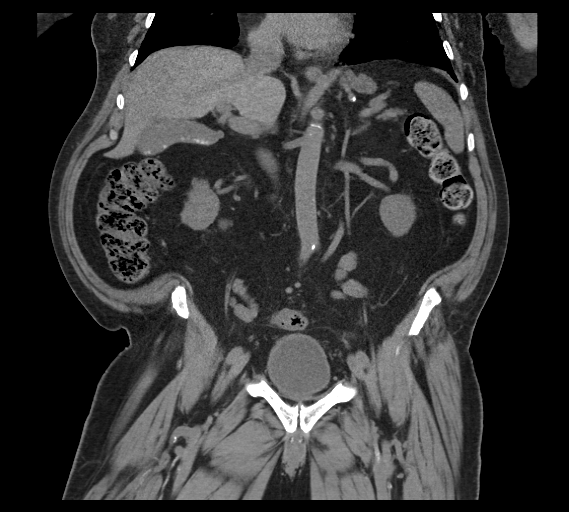

[16 of 46 positions shown; findings below may reference images not displayed]

FINDINGS: Lung bases demonstrate a few tiny nodular densities stable from the
prior exam. Given their stability there are benign in etiology.

The liver, spleen, adrenal glands and pancreas are all normal in
their CT appearance. The gallbladder is well distended and
demonstrates multiple gallstones. No pericholecystic fluid or
gallbladder wall thickening is noted. The kidneys are well
visualized bilaterally. A single nonobstructing 2-3 mm stone is
noted in the lower pole of the left kidney. This is stable from the
prior exam. The collecting systems and ureters are within normal
limits. No obstructive changes are seen. An exophytic cyst is again
noted arising from the lower pole of the right kidney. A retro
aortic left renal vein is seen.

Aortoiliac calcifications are noted without aneurysmal dilatation.
The appendix is not well visualized. No inflammatory changes are
seen. The bladder is partially distended. Changes of prior prostate
brachytherapy are noted. No pelvic mass lesion or sidewall
adenopathy is seen. The osseous structures show degenerative change
of the lumbar spine.
IMPRESSION: Multiple gallstones without complicating factors.

Tiny stable nodular densities in the lung bases bilaterally
consistent with benign granulomas.

Nonobstructing left renal stone. Right renal cyst is again seen and
stable.

## 2016-05-08 DIAGNOSIS — G3183 Dementia with Lewy bodies: Secondary | ICD-10-CM | POA: Diagnosis not present

## 2016-05-08 DIAGNOSIS — F339 Major depressive disorder, recurrent, unspecified: Secondary | ICD-10-CM | POA: Diagnosis not present

## 2016-05-08 DIAGNOSIS — N3281 Overactive bladder: Secondary | ICD-10-CM | POA: Diagnosis not present

## 2016-05-08 DIAGNOSIS — G2581 Restless legs syndrome: Secondary | ICD-10-CM | POA: Diagnosis not present

## 2016-05-08 DIAGNOSIS — I4891 Unspecified atrial fibrillation: Secondary | ICD-10-CM | POA: Diagnosis not present

## 2016-05-08 DIAGNOSIS — J301 Allergic rhinitis due to pollen: Secondary | ICD-10-CM | POA: Diagnosis not present

## 2016-05-08 DIAGNOSIS — G20 Parkinson's disease: Secondary | ICD-10-CM | POA: Diagnosis not present

## 2016-05-08 DIAGNOSIS — J4520 Mild intermittent asthma, uncomplicated: Secondary | ICD-10-CM | POA: Diagnosis not present

## 2016-05-08 DIAGNOSIS — R1310 Dysphagia, unspecified: Secondary | ICD-10-CM | POA: Diagnosis not present

## 2016-05-08 DIAGNOSIS — I10 Essential (primary) hypertension: Secondary | ICD-10-CM | POA: Diagnosis not present

## 2016-05-08 DIAGNOSIS — I2510 Atherosclerotic heart disease of native coronary artery without angina pectoris: Secondary | ICD-10-CM | POA: Diagnosis not present

## 2016-05-25 ENCOUNTER — Other Ambulatory Visit: Payer: Self-pay

## 2016-05-25 MED ORDER — FLUTICASONE-SALMETEROL 250-50 MCG/DOSE IN AEPB: 1.0000 | INHALATION_SPRAY | Freq: Two times a day (BID) | RESPIRATORY_TRACT | Status: AC

## 2016-05-30 ENCOUNTER — Other Ambulatory Visit: Payer: Self-pay | Admitting: Internal Medicine

## 2016-06-02 ENCOUNTER — Telehealth: Payer: Self-pay | Admitting: Emergency Medicine

## 2016-06-02 ENCOUNTER — Other Ambulatory Visit: Payer: Self-pay | Admitting: Internal Medicine

## 2016-06-02 MED ORDER — TRIAMCINOLONE ACETONIDE 55 MCG/ACT NA AERO: 2.0000 | INHALATION_SPRAY | Freq: Every day | NASAL | 12 refills | Status: DC

## 2016-06-02 NOTE — Telephone Encounter (Signed)
Ok for change of veramyst to [employer] - done erx

## 2016-06-02 NOTE — Telephone Encounter (Signed)
Pharmacy called and the medication fluticasone (VERAMYST) 27.5 MCG/SPRAY nasal spray is no longer available. They want to know if they can get an alternative. Please follow up thanks.

## 2016-06-07 DIAGNOSIS — L89152 Pressure ulcer of sacral region, stage 2: Secondary | ICD-10-CM | POA: Diagnosis not present

## 2016-06-07 DIAGNOSIS — R52 Pain, unspecified: Secondary | ICD-10-CM | POA: Diagnosis not present

## 2016-06-07 DIAGNOSIS — I10 Essential (primary) hypertension: Secondary | ICD-10-CM | POA: Diagnosis not present

## 2016-06-07 DIAGNOSIS — G3183 Dementia with Lewy bodies: Secondary | ICD-10-CM | POA: Diagnosis not present

## 2016-06-07 DIAGNOSIS — I482 Chronic atrial fibrillation: Secondary | ICD-10-CM | POA: Diagnosis not present

## 2016-06-07 DIAGNOSIS — B3749 Other urogenital candidiasis: Secondary | ICD-10-CM | POA: Diagnosis not present

## 2016-06-08 DIAGNOSIS — G2581 Restless legs syndrome: Secondary | ICD-10-CM | POA: Diagnosis not present

## 2016-06-08 DIAGNOSIS — G3183 Dementia with Lewy bodies: Secondary | ICD-10-CM | POA: Diagnosis not present

## 2016-06-08 DIAGNOSIS — I2510 Atherosclerotic heart disease of native coronary artery without angina pectoris: Secondary | ICD-10-CM | POA: Diagnosis not present

## 2016-06-08 DIAGNOSIS — J301 Allergic rhinitis due to pollen: Secondary | ICD-10-CM | POA: Diagnosis not present

## 2016-06-08 DIAGNOSIS — R1310 Dysphagia, unspecified: Secondary | ICD-10-CM | POA: Diagnosis not present

## 2016-06-08 DIAGNOSIS — I4891 Unspecified atrial fibrillation: Secondary | ICD-10-CM | POA: Diagnosis not present

## 2016-06-08 DIAGNOSIS — I10 Essential (primary) hypertension: Secondary | ICD-10-CM | POA: Diagnosis not present

## 2016-06-08 DIAGNOSIS — N3281 Overactive bladder: Secondary | ICD-10-CM | POA: Diagnosis not present

## 2016-06-08 DIAGNOSIS — G20 Parkinson's disease: Secondary | ICD-10-CM | POA: Diagnosis not present

## 2016-06-08 DIAGNOSIS — J4520 Mild intermittent asthma, uncomplicated: Secondary | ICD-10-CM | POA: Diagnosis not present

## 2016-06-08 DIAGNOSIS — F339 Major depressive disorder, recurrent, unspecified: Secondary | ICD-10-CM | POA: Diagnosis not present

## 2016-06-29 DIAGNOSIS — I482 Chronic atrial fibrillation: Secondary | ICD-10-CM | POA: Diagnosis not present

## 2016-06-29 DIAGNOSIS — B3749 Other urogenital candidiasis: Secondary | ICD-10-CM | POA: Diagnosis not present

## 2016-06-29 DIAGNOSIS — R52 Pain, unspecified: Secondary | ICD-10-CM | POA: Diagnosis not present

## 2016-06-29 DIAGNOSIS — L89152 Pressure ulcer of sacral region, stage 2: Secondary | ICD-10-CM | POA: Diagnosis not present

## 2016-06-29 DIAGNOSIS — G3183 Dementia with Lewy bodies: Secondary | ICD-10-CM | POA: Diagnosis not present

## 2016-06-29 DIAGNOSIS — I10 Essential (primary) hypertension: Secondary | ICD-10-CM | POA: Diagnosis not present

## 2016-07-08 ENCOUNTER — Other Ambulatory Visit: Payer: Self-pay

## 2016-07-09 DIAGNOSIS — F339 Major depressive disorder, recurrent, unspecified: Secondary | ICD-10-CM | POA: Diagnosis not present

## 2016-07-09 DIAGNOSIS — R1310 Dysphagia, unspecified: Secondary | ICD-10-CM | POA: Diagnosis not present

## 2016-07-09 DIAGNOSIS — J4520 Mild intermittent asthma, uncomplicated: Secondary | ICD-10-CM | POA: Diagnosis not present

## 2016-07-09 DIAGNOSIS — G3183 Dementia with Lewy bodies: Secondary | ICD-10-CM | POA: Diagnosis not present

## 2016-07-09 DIAGNOSIS — G2581 Restless legs syndrome: Secondary | ICD-10-CM | POA: Diagnosis not present

## 2016-07-09 DIAGNOSIS — I2510 Atherosclerotic heart disease of native coronary artery without angina pectoris: Secondary | ICD-10-CM | POA: Diagnosis not present

## 2016-07-09 DIAGNOSIS — J301 Allergic rhinitis due to pollen: Secondary | ICD-10-CM | POA: Diagnosis not present

## 2016-07-09 DIAGNOSIS — I4891 Unspecified atrial fibrillation: Secondary | ICD-10-CM | POA: Diagnosis not present

## 2016-07-09 DIAGNOSIS — G20 Parkinson's disease: Secondary | ICD-10-CM | POA: Diagnosis not present

## 2016-07-09 DIAGNOSIS — I10 Essential (primary) hypertension: Secondary | ICD-10-CM | POA: Diagnosis not present

## 2016-07-09 DIAGNOSIS — N3281 Overactive bladder: Secondary | ICD-10-CM | POA: Diagnosis not present

## 2016-07-23 DIAGNOSIS — I482 Chronic atrial fibrillation: Secondary | ICD-10-CM | POA: Diagnosis not present

## 2016-07-23 DIAGNOSIS — B3749 Other urogenital candidiasis: Secondary | ICD-10-CM | POA: Diagnosis not present

## 2016-07-23 DIAGNOSIS — R52 Pain, unspecified: Secondary | ICD-10-CM | POA: Diagnosis not present

## 2016-07-23 DIAGNOSIS — G3183 Dementia with Lewy bodies: Secondary | ICD-10-CM | POA: Diagnosis not present

## 2016-07-23 DIAGNOSIS — I10 Essential (primary) hypertension: Secondary | ICD-10-CM | POA: Diagnosis not present

## 2016-07-27 ENCOUNTER — Ambulatory Visit (INDEPENDENT_AMBULATORY_CARE_PROVIDER_SITE_OTHER): Admitting: Neurology

## 2016-07-27 ENCOUNTER — Encounter: Payer: Self-pay | Admitting: Neurology

## 2016-07-27 VITALS — BP 140/76 | HR 80 | Wt 216.0 lb

## 2016-07-27 DIAGNOSIS — G238 Other specified degenerative diseases of basal ganglia: Secondary | ICD-10-CM

## 2016-07-27 DIAGNOSIS — R251 Tremor, unspecified: Secondary | ICD-10-CM

## 2016-07-27 DIAGNOSIS — G20 Parkinson's disease: Secondary | ICD-10-CM | POA: Diagnosis not present

## 2016-07-27 MED ORDER — CARBIDOPA-LEVODOPA ER 36.25-145 MG PO CPCR: 1.0000 | ORAL_CAPSULE | Freq: Three times a day (TID) | ORAL | 3 refills | Status: DC

## 2016-07-27 NOTE — Patient Instructions (Signed)
[name] Chou, MD is primary care .   Multiple System Atrophy Multiple system atrophy (MSA) is a rare disease of the brain and spinal cord (central nervous system) that causes a loss of nerve cells. Atrophy means loss of function. The condition used to be called Shy-Drager syndrome.  Multiple system atrophy affects two areas of the central nervous system. It may affect your autonomic nervous system, which controls functions such as blood pressure, heart rate, and bladder function. MSA may also affect your motor system, which controls balance and muscle movements. Symptoms tend to get worse over time. There is no cure, but treatment may help control some of the symptoms. CAUSES  The cause of MSA is not known. SIGNS AND SYMPTOMS Symptoms of MSA depend on which part of your nervous system is involved. You may have symptoms that mainly affect either your autonomic or motor system, or you might have a combination of symptoms that affect both. Symptoms of autonomic disease include:  Low blood pressure when standing that causes dizziness or fainting (orthostatic hypotension).  Bladder control problems (incontinence).  Constipation.  Difficulty getting an erection (erectile dysfunction).  Dry mouth and skin.  Irregular heartbeat. Symptoms of motor disease include:  Rigid muscles.  Slowed movement.  Poor balance.  Trembling.  Clumsiness.  Trouble speaking.  Double vision.  Trouble chewing and swallowing.  Trouble breathing. DIAGNOSIS  At first, MSA can seem like other nervous system disorders. Because of this, it may take a long time to get a diagnosis of MSA. There is no single test that can be used to diagnose the condition. You may be referred to a specialist in nervous system diseases (neurologist). A neurologist may diagnose MSA by doing a physical and neurological exam and by evaluating your symptoms that get worse over time.  Imaging studies of the brain may also be done,  including:  MRI.  PET scan.  A test in which a radioactive substance is used to see specific cells in the brain (DaTscan). TREATMENT  No treatments can cure or slow the progression of MSA. The goal of treatment is to relieve symptoms. Treatment may include:  Medicine to improve symptoms affecting your motor system.  Medicine to control blood pressure.  Medicine to relieve constipation and control incontinence.  A feeding tube if swallowing becomes difficult.  Physical therapy to keep muscles healthy for as long as possible.  A walker or wheelchair if walking becomes difficult. HOME CARE INSTRUCTIONS  Work closely with your health care providers. MSA is a progressive disease that will get worse over time.  Take medicines only as directed by your health care provider.  Increase the amount of salt in your diet as directed by your health care provider. This can help prevent orthostatic hypotension.  Sleep with the head of your bed slightly raised. This may reduce orthostatic hypotension in the morning.  Increase the amount of fiber in your diet to help prevent constipation.  Stay as active as you can. Ask your health care provider to recommend a safe level of exercise for you.  Make sure you have a good support system at home.  Keep all follow-up visits as directed by your health care provider. This is important. SEEK MEDICAL CARE IF:  Your symptoms change or get worse.  You need more support at home. SEEK IMMEDIATE MEDICAL CARE IF:  You have an injury from a fainting spell.  You choke when you try to swallow food or fluids.  You have trouble breathing.  You have chest pain.   This information is not intended to replace advice given to you by your health care provider. Make sure you discuss any questions you have with your health care provider.   Document Released: 10/15/2002 Document Revised: 11/15/2014 Document Reviewed: 12/28/2013 Elsevier Interactive Patient  Education [employer].

## 2016-07-27 NOTE — Progress Notes (Signed)
Subjective:    Patient ID: [name] [name] is a 81 y.o.  male.   07-27-2016  His last visit in our office was was [name] Givens, NP, in March of 2017 . He has been on Rytary . He is followed here for his CPAP compliance report .Parkinson plus syndrome, Gait instability - wheelchair bound,  he needed a power wheelchair for mobility assistance as his legs give out.  Denies any loss of sense of smell. He needs regular q 6 month cognitive testing. The patient was tested today  and scored in his mini-mental status exam 22-30, his wife thinks he improved off Klonopin. He is off Vesicare, too.  His wheelchair foot pedals were removed during this visit and he has less swelling at the ankle . The patient has noticeable muscle mass loss in both lower extremities also they're not as swollen as before, his urine catheter has been attached to the right lower extremity, he does provide grip strength with both hands the left is weaker, he has more spasticity in a stronger amplitude tremor and cogwheeling in his left arm.The patient is a very pleasant 81 y.o. male in no acute distress.he has lost weight, has lost vocal volume, lost weight - 216 pounds. He spent some time in respite care ,  Lost not further weight there . He needs help to eat , to move, and toileting and dressing.      Interval history from 09-09-15, [name] Seat, MD   The patient has done very well on Rytary,  the tremor is controlled . In the meantime he has lost bladder control, his wife has noted that he has more and more difficulties to move his right arm. In the left arm with frozen due to arthritis... He does have dry mouth and dry eyes often his mouth is open. He has a rare blink. All this fits his Parkinson's disease he has dysarthria and by now has some apraxia he has only learned the instinct of swallowing. The right tarry pills also has the benefit of being smaller and easier to be taken by mouth for him. He has lost control of his right  arm, he does not control his feet or legs very well and he needs full assist when transferring or just turning in bed his wife also can no longer assist him alone due to his body weight. He uses thick liquids honey thickened liquids to avoid choking. Mrs. Thrapp suffers from a progressive neuro-degenerative disease and has the character of multisystem atrophy/  asymmetric parkinsonism, with dysautonomic features as well. He also has some memory loss and cognitive deficits. In 12 month he had 4 pneumonias and 3 UTI s.  His wife is putting together 24 hour care with the help of LTC insurance. She may involve hospice. This would be an assistance with medication and which the intake of medication. Hospice usually also handles pain medication for patients with fatal illness.    History per prior notes;  [name] Like, DO  [name] [name] is a very pleasant 81 yo RH gentleman . He is accompanied by his wife today. He has an underlying complex history including parkinsonism, dementia, anxiety, OSA, restless leg syndrome, hearing loss, hypertension, heart disease with status post MI and status post CABG, atrial fibrillation, history of syncope, insomnia, history of colon cancer, asthma, spinal stenosis, thyroid disease, arthritis, and has had recurrent falls. He started seeing Dr. Brett Fairy I believe in 2011 and was most recently seen by our nurse practitioner in  February 2014, at which time he was continued on Sinemet 3 times a day and advised to reduce prednisone. His wife called today stating that to him he needed to be seen because of recurrent falls. His appointment was canceled for last week and he was not yet rescheduled for another appointment. He was seen in the emergency room on 05/15/2013 after a fall. He had fallen backwards hitting his head on concrete while trying to get into his car. His wife also reported increasing confusion over the past for 5 days prior to your ED visit. He had a CT head which showed stable  atrophy and white matter changes and no acute abnormality. He had chest x-ray which was negative for pneumonia. Laboratory function showed normal electrolytes, normal CBC, normal urinalysis. His wife states that is legs give out and he has a tendency to fall backwards. He has had PT before and last went in November. PT has helped in the past. She would [name] a referral for PT again. He went to PT and speech rehab on [address] [facility] [phone]).   In summary, [name] [name] is a very pleasant 81 y.o.-year old male with a complicated medical Hx who has left-sided predominant  MSA .memory loss, gait disorder, likely multifactorial in nature.  Dr Janann Colonel had increased his SInemet 25-100 to 1.5 tablets 4x a day. This helped him to move better, but did not affect the dysautonomia.  He seems to have declined further, walks only 20 steps. He needs a Power wheelchair. Needs a [employer] fast streamer.  His Current Medications Are:  Outpatient Encounter Prescriptions as of 07/27/2016  Medication Sig Dispense Refill  . ALPRAZolam (XANAX) 0.5 MG tablet Take 0.5 mg by mouth as needed.  5  . amLODipine (NORVASC) 5 MG tablet Take 1 tablet (5 mg total) by mouth daily. 90 tablet 3  . apixaban (ELIQUIS) 5 MG TABS tablet Take 1 tablet (5 mg total) by mouth 2 (two) times daily. 180 tablet 3  . buPROPion (WELLBUTRIN XL) 150 MG 24 hr tablet TAKE 1 TABLET DAILY 90 tablet 3  . Carbidopa-Levodopa ER (RYTARY) 36.25-145 MG CPCR Take 1 capsule by mouth 4 (four) times daily - after meals and at bedtime. 360 capsule 3  . clonazePAM (KLONOPIN) 1 MG tablet Take 2 tablets (2 mg total) by mouth at bedtime. Can take 1/2 tablet more, if having difficulty sleeping. 225 tablet 1  . desloratadine (CLARINEX) 5 MG tablet Take 1 tablet (5 mg total) by mouth daily. 90 tablet 3  . docusate sodium (COLACE) 100 MG capsule Take 300 mg by mouth at bedtime.     . donepezil (ARICEPT) 23 MG TABS tablet Take 1 tablet (23 mg total) by mouth every morning. 90 tablet 3  .  Fluticasone-Salmeterol (ADVAIR) 250-50 MCG/DOSE AEPB Inhale 1 puff into the lungs 2 (two) times daily. 180 each 1  . haloperidol (HALDOL) 2 MG tablet Take 1 tablet (2 mg total) by mouth every 8 (eight) hours as needed (hiccups). 10 tablet 0  . ibuprofen (ADVIL,MOTRIN) 200 MG tablet Take 400 mg by mouth every 6 (six) hours as needed for pain.    Marland Kitchen levocetirizine (XYZAL) 5 MG tablet Take 1 tablet (5 mg total) by mouth every evening. 90 tablet 3  . MUCINEX MAXIMUM STRENGTH 1200 MG TB12 Take 1 tablet by mouth 2 (two) times daily.  3  . primidone (MYSOLINE) 50 MG tablet Take 1 tablet (50 mg total) by mouth 2 (two) times daily before a meal. 180  tablet 3  . PROAIR HFA 108 (90 Base) MCG/ACT inhaler USE 2 INHALATIONS ORALLY   EVERY 4 HOURS AS NEEDED FORWHEEZING 25.5 g 3  . solifenacin (VESICARE) 5 MG tablet Take 1 tablet (5 mg total) by mouth daily. 90 tablet 3  . traMADol (ULTRAM) 50 MG tablet Take 50 mg by mouth every 6 (six) hours as needed. for pain  0  . triamcinolone (NASACORT AQ) 55 MCG/ACT AERO nasal inhaler Place 2 sprays into the nose daily. 1 Inhaler 12  . triamcinolone cream (KENALOG) 0.1 % Apply 1 application topically 2 (two) times daily. 30 g 0   No facility-administered encounter medications on file as of 07/27/2016.    Review of Systems  Constitutional: Positive for fatigue.  HENT: Positive for hearing loss, rhinorrhea, trouble swallowing and tinnitus.   Respiratory: Positive for shortness of breath and wheezing.        Uses a CPAP  Gastrointestinal: Positive for constipation.  Musculoskeletal: Positive for myalgias and arthralgias.  Allergic/Immunologic: Positive for environmental allergies.  Neurological: Positive for tremors.       Memory loss Restless leg  Hematological: Bruises/bleeds easily.  Psychiatric/Behavioral: Positive for confusion, sleep disturbance and dysphoric mood. The patient alert .     Objective:  Neurologic Exam  Physical Exam Physical Examination:    His repeat sitting blood pressure was 121/67 with a pulse of 57 and standing blood pressure was 129/68 with a pulse of 60. He did not report any lightheadedness upon standing.  General Examination:   Normocephalic, atraumatic,   Neurologically:  Mental status: The patient is awake and alert, paying good  attention. He is able to partially provide the history. Masked face.  His wife provides details of the history. His  MMSE was 22out of 29,  There is a mild degree of bradyphrenia. Speech is hypophonic and dysarthria is noted. Mood is ' gloomy" .  Cranial nerves are as described above under HEENT exam. Pupils are equal, round and reactive to light and accommodation. Funduscopic exam is normal with sharp disc margins noted. Extraocular tracking shows mild saccadic breakdown without nystagmus noted. There is limitation to upper gaze. There is mild decrease in eye blink rate. He is very hoarse.  Hearing is intact.   Face is symmetric with significant  facial masking and normal facial sensation.  There is no lip, neck or jaw tremor. Neck is moderately rigid with intact passive ROM. There are no carotid bruits on auscultation. Tongue protrudes centrally and palate elevates symmetrically.   There is no drooling.In addition, shoulder shrug is normal with equal shoulder height noted.  Motor exam: Patient has lost muscle bulk in both lower extremities are little bit more on the left. His reflexes are very brisk also a little bit more dominantly on the left and this passive range of motion there is cogwheeling with an amplitude that exceeds left over right. He did provide good grip strengths but the grip strength is stronger on his right hand.  Tone is rigid with strong biceps and wrist cogwheeling. There is overall severe bradykinesia. There is no drift or rebound. The patient has a resting tremor that is more dominant in the left than in the right hand but the amplitude has definitely increased since his  last visit. There is also the occasional jerk in both arms. His feet standstill and he seems not to have any tremor  involving the lower extremity.   Reflexes are 3+ in the upper extremities and 1 plus patella /  lower extremities. The patient is in a reclined position his legs tend to scissor, and he does have foot deformity, his feet look twisted with a dominant toe, Spitzfuss.     Fine motor skills exam: Finger taps are impaired on both sides. Hand movements are impaired . RAP (rapid alternating patting) is severly  impaired bilaterally.  Foot taps are strongly  impaired on the right and moderately impaired on the left.  Foot agility (in the form of heel stomping) is moderately impaired on both sides. Brief myoclonic jerks of bilateral UE noted.  Sensory exam is intact to light touch, pinprick, vibration, temperature sense and proprioception in the upper and lower extremities.   Gait, station and balance: He requires assistance to stands up from the seated position from his wheelchair.Marland Kitchen Requires assistance totransfer, is dysphonic and trembling . Unsteady.  Stance is wide-based.  Tandem walk is not possible. Balance is now severely impaired, unable to do a toe or heel stance. He seems to be oriented to time and place and his Mini-Mental Status Examination was actually reaching a higher score than 6 months ago.  The patient is a very pleasant 81 y.o. male in no acute distress.he has lost weight, has lost vocal volume, lost weight - 216 pounds. He spent some time in respite care , lost weight. He needs help to eat , to move, and toileting and dressing.       Assessment and Plan:   1) Parkinson plus syndrome, Gait instability , The suspected multisystem atrophy.  He requires now 24 7 attention, assistive care, transfer of care, help with toileting, help dressing.  Total incontinence, a Hoyer lift is used at home. He has dysphagia.   cognitive impairment , MMSE 22-30 .  MMSE - Mini Mental  State Exam 07/27/2016 09/09/2015  Orientation to time 3 5  Orientation to Place 3 5  Registration 3 3  Attention/ Calculation 2 1  Recall 3 2  Language- name 2 objects 2 2  Language- repeat 1 1  Language- follow 3 step command 3 3  Language- read & follow direction 1 1  Write a sentence 1 1  Copy design 0 0  Total score 22 24      - FACE TO FACE ;  power wheelchair for mobility assistance.  This visit was precipitated by the patient's frequent falls the last on right here in the office witnessed today. He has a new power wheelchair. He is able to use his hands to steer the device but I would not want him to have a manual wheelchair as he doesn't have the strength to propel himself forward. Patient has lost muscle mass and is failing to thrive, social implications, care taker and care giver needs discussed.   3) OSA, not addressed today.

## 2016-07-28 ENCOUNTER — Telehealth: Payer: Self-pay | Admitting: Neurology

## 2016-07-28 NOTE — Telephone Encounter (Signed)
Patient's wife is calling. The patient was seen yesterday in our office and was to have lab work. The patient's ride was already here after the visit so the patient did not have lab done. Can someone come to the patient's home to do the lab since the patient is not able to go to have it done? Please call and advise.

## 2016-07-28 NOTE — Telephone Encounter (Signed)
I spoke to Dr. Brett Fairy. She intended to order a CMET and CBC for pt but the pt's transportation arrived and he was not able to to stay for a lab draw. Dr. Brett Fairy said that pt may have the labs drawn at his PCP. I called pt's wife to discuss. No answer, left a message asking her to call me back.

## 2016-07-28 NOTE — Telephone Encounter (Signed)
I spoke to pt's wife. I advised her of this information. She says that there is no way to get him out of the house; they have to get a rental [place]ianne Lei or hire a transportation company to get him anywhere. Pt's wife suggested that I call [facility] and perhaps they could draw his blood.  I called [facility] and spoke to [employer]. She says that they will be able to draw a CMET and CBC and they will fax the results to Alvester Chou, NP and to Dr. Brett Fairy. This will be done into the next 1-2 days.  I spoke to pt's wife and advised her of this. Pt's wife verbalized understanding.

## 2016-07-29 ENCOUNTER — Encounter: Payer: Self-pay | Admitting: Neurology

## 2016-07-29 DIAGNOSIS — Z79899 Other long term (current) drug therapy: Secondary | ICD-10-CM | POA: Diagnosis not present

## 2016-07-29 DIAGNOSIS — R6889 Other general symptoms and signs: Secondary | ICD-10-CM | POA: Diagnosis not present

## 2016-08-08 DIAGNOSIS — G20 Parkinson's disease: Secondary | ICD-10-CM | POA: Diagnosis not present

## 2016-08-08 DIAGNOSIS — G2581 Restless legs syndrome: Secondary | ICD-10-CM | POA: Diagnosis not present

## 2016-08-08 DIAGNOSIS — G3183 Dementia with Lewy bodies: Secondary | ICD-10-CM | POA: Diagnosis not present

## 2016-08-08 DIAGNOSIS — I4891 Unspecified atrial fibrillation: Secondary | ICD-10-CM | POA: Diagnosis not present

## 2016-08-08 DIAGNOSIS — J301 Allergic rhinitis due to pollen: Secondary | ICD-10-CM | POA: Diagnosis not present

## 2016-08-08 DIAGNOSIS — I2510 Atherosclerotic heart disease of native coronary artery without angina pectoris: Secondary | ICD-10-CM | POA: Diagnosis not present

## 2016-08-08 DIAGNOSIS — F339 Major depressive disorder, recurrent, unspecified: Secondary | ICD-10-CM | POA: Diagnosis not present

## 2016-08-08 DIAGNOSIS — R1310 Dysphagia, unspecified: Secondary | ICD-10-CM | POA: Diagnosis not present

## 2016-08-08 DIAGNOSIS — J4520 Mild intermittent asthma, uncomplicated: Secondary | ICD-10-CM | POA: Diagnosis not present

## 2016-08-08 DIAGNOSIS — N3281 Overactive bladder: Secondary | ICD-10-CM | POA: Diagnosis not present

## 2016-08-08 DIAGNOSIS — I10 Essential (primary) hypertension: Secondary | ICD-10-CM | POA: Diagnosis not present

## 2016-08-10 ENCOUNTER — Telehealth: Payer: Self-pay

## 2016-08-10 NOTE — Telephone Encounter (Signed)
Received results from lab draw done by Hospice. Dr. Brett Fairy reviewed them and asked me to call pt and wife and advise them that his results are normal. I called and spoke to pt's wife and advised her that the results were normal, per Dr. Brett Fairy. Pt's wife verbalized understanding of results. Will send copy of results to MR to be scanned into EPIC.

## 2016-09-08 DIAGNOSIS — G20 Parkinson's disease: Secondary | ICD-10-CM | POA: Diagnosis not present

## 2016-09-08 DIAGNOSIS — F339 Major depressive disorder, recurrent, unspecified: Secondary | ICD-10-CM | POA: Diagnosis not present

## 2016-09-08 DIAGNOSIS — N3281 Overactive bladder: Secondary | ICD-10-CM | POA: Diagnosis not present

## 2016-09-08 DIAGNOSIS — G2581 Restless legs syndrome: Secondary | ICD-10-CM | POA: Diagnosis not present

## 2016-09-08 DIAGNOSIS — J4520 Mild intermittent asthma, uncomplicated: Secondary | ICD-10-CM | POA: Diagnosis not present

## 2016-09-08 DIAGNOSIS — R1310 Dysphagia, unspecified: Secondary | ICD-10-CM | POA: Diagnosis not present

## 2016-09-08 DIAGNOSIS — G3183 Dementia with Lewy bodies: Secondary | ICD-10-CM | POA: Diagnosis not present

## 2016-09-08 DIAGNOSIS — I10 Essential (primary) hypertension: Secondary | ICD-10-CM | POA: Diagnosis not present

## 2016-09-08 DIAGNOSIS — J301 Allergic rhinitis due to pollen: Secondary | ICD-10-CM | POA: Diagnosis not present

## 2016-09-08 DIAGNOSIS — I4891 Unspecified atrial fibrillation: Secondary | ICD-10-CM | POA: Diagnosis not present

## 2016-09-08 DIAGNOSIS — I2510 Atherosclerotic heart disease of native coronary artery without angina pectoris: Secondary | ICD-10-CM | POA: Diagnosis not present

## 2016-09-24 DIAGNOSIS — G3183 Dementia with Lewy bodies: Secondary | ICD-10-CM | POA: Diagnosis not present

## 2016-09-24 DIAGNOSIS — N3289 Other specified disorders of bladder: Secondary | ICD-10-CM | POA: Diagnosis not present

## 2016-09-24 DIAGNOSIS — I482 Chronic atrial fibrillation: Secondary | ICD-10-CM | POA: Diagnosis not present

## 2016-09-24 DIAGNOSIS — I10 Essential (primary) hypertension: Secondary | ICD-10-CM | POA: Diagnosis not present

## 2016-09-24 DIAGNOSIS — L89152 Pressure ulcer of sacral region, stage 2: Secondary | ICD-10-CM | POA: Diagnosis not present

## 2016-10-08 DIAGNOSIS — J4520 Mild intermittent asthma, uncomplicated: Secondary | ICD-10-CM | POA: Diagnosis not present

## 2016-10-08 DIAGNOSIS — I2510 Atherosclerotic heart disease of native coronary artery without angina pectoris: Secondary | ICD-10-CM | POA: Diagnosis not present

## 2016-10-08 DIAGNOSIS — G2581 Restless legs syndrome: Secondary | ICD-10-CM | POA: Diagnosis not present

## 2016-10-08 DIAGNOSIS — I4891 Unspecified atrial fibrillation: Secondary | ICD-10-CM | POA: Diagnosis not present

## 2016-10-08 DIAGNOSIS — G20 Parkinson's disease: Secondary | ICD-10-CM | POA: Diagnosis not present

## 2016-10-08 DIAGNOSIS — G3183 Dementia with Lewy bodies: Secondary | ICD-10-CM | POA: Diagnosis not present

## 2016-10-08 DIAGNOSIS — N3281 Overactive bladder: Secondary | ICD-10-CM | POA: Diagnosis not present

## 2016-10-08 DIAGNOSIS — R1310 Dysphagia, unspecified: Secondary | ICD-10-CM | POA: Diagnosis not present

## 2016-10-08 DIAGNOSIS — J301 Allergic rhinitis due to pollen: Secondary | ICD-10-CM | POA: Diagnosis not present

## 2016-10-08 DIAGNOSIS — F339 Major depressive disorder, recurrent, unspecified: Secondary | ICD-10-CM | POA: Diagnosis not present

## 2016-10-08 DIAGNOSIS — I10 Essential (primary) hypertension: Secondary | ICD-10-CM | POA: Diagnosis not present

## 2016-10-22 DIAGNOSIS — B3749 Other urogenital candidiasis: Secondary | ICD-10-CM | POA: Diagnosis not present

## 2016-10-22 DIAGNOSIS — G3183 Dementia with Lewy bodies: Secondary | ICD-10-CM | POA: Diagnosis not present

## 2016-10-22 DIAGNOSIS — I10 Essential (primary) hypertension: Secondary | ICD-10-CM | POA: Diagnosis not present

## 2016-10-22 DIAGNOSIS — N3289 Other specified disorders of bladder: Secondary | ICD-10-CM | POA: Diagnosis not present

## 2016-10-22 DIAGNOSIS — I482 Chronic atrial fibrillation: Secondary | ICD-10-CM | POA: Diagnosis not present

## 2016-10-22 DIAGNOSIS — L89152 Pressure ulcer of sacral region, stage 2: Secondary | ICD-10-CM | POA: Diagnosis not present

## 2016-10-28 ENCOUNTER — Other Ambulatory Visit: Payer: Self-pay | Admitting: Neurology

## 2016-10-28 DIAGNOSIS — G20 Parkinson's disease: Secondary | ICD-10-CM

## 2016-10-28 DIAGNOSIS — R251 Tremor, unspecified: Secondary | ICD-10-CM

## 2016-10-28 DIAGNOSIS — G3183 Dementia with Lewy bodies: Secondary | ICD-10-CM

## 2016-10-28 DIAGNOSIS — F0281 Dementia in other diseases classified elsewhere with behavioral disturbance: Secondary | ICD-10-CM

## 2017-01-13 ENCOUNTER — Telehealth: Payer: Self-pay | Admitting: Neurology

## 2017-01-13 MED ORDER — ROPINIROLE HCL 0.25 MG PO TABS: 0.5000 mg | ORAL_TABLET | Freq: Every day | ORAL | 3 refills | Status: AC

## 2017-01-13 NOTE — Telephone Encounter (Signed)
I will take this over.

## 2017-01-13 NOTE — Telephone Encounter (Signed)
Patient's wife requesting new Rx for rOPINIRole (REQUIP) 0.25 MG tablet sent to [facility] on [address]. The patient has changed  pharmacies.

## 2017-01-13 NOTE — Addendum Note (Signed)
Addended by: Larey Seat on: 01/13/2017 05:00 PM   Modules accepted: Orders

## 2017-01-13 NOTE — Telephone Encounter (Signed)
I reviewed pt's chart, and I cannot find where Dr. Brett Fairy has ever prescribed requip for this pt.  I called pt's wife. She says that Dr. Brett Fairy prescribes all of his neurological medications and does not know who orders the requip, if Dr. Brett Fairy does not do it. She says that they have changed physicians but kept Dr. Brett Fairy. I advised her that I would send this request to Dr. Brett Fairy and Dr. Brett Fairy will decide whether she wants to take this RX over or not. Pt's wife reports that he takes ropinirole 0.25mg  tablets, two tablets at bedtime.

## 2017-01-14 NOTE — Telephone Encounter (Signed)
Rx sent in

## 2017-07-27 ENCOUNTER — Ambulatory Visit: Payer: Medicare Other | Admitting: Neurology

## 2017-08-22 ENCOUNTER — Other Ambulatory Visit: Payer: Self-pay | Admitting: Neurology

## 2017-08-22 DIAGNOSIS — R251 Tremor, unspecified: Secondary | ICD-10-CM

## 2017-08-22 DIAGNOSIS — G20 Parkinson's disease: Secondary | ICD-10-CM

## 2017-08-22 MED ORDER — CARBIDOPA-LEVODOPA ER 36.25-145 MG PO CPCR: 1.0000 | ORAL_CAPSULE | Freq: Three times a day (TID) | ORAL | 0 refills | Status: AC

## 2017-08-22 MED ORDER — CARBIDOPA-LEVODOPA ER 36.25-145 MG PO CPCR: 1.0000 | ORAL_CAPSULE | Freq: Three times a day (TID) | ORAL | 0 refills | Status: DC

## 2018-03-08 DEATH — deceased

## 2018-12-04 ENCOUNTER — Encounter: Payer: Self-pay | Admitting: Internal Medicine
